# Patient Record
Sex: Female | Born: 1945 | Hispanic: No | Marital: Married | Smoking: Never smoker
Health system: Southern US, Community
[De-identification: ages and names within clinical notes are randomized; demographics above are authoritative.]

## PROBLEM LIST (undated history)

## (undated) DIAGNOSIS — E559 Vitamin D deficiency, unspecified: Secondary | ICD-10-CM

## (undated) DIAGNOSIS — Z1211 Encounter for screening for malignant neoplasm of colon: Secondary | ICD-10-CM

## (undated) DIAGNOSIS — M899 Disorder of bone, unspecified: Secondary | ICD-10-CM

## (undated) DIAGNOSIS — E876 Hypokalemia: Secondary | ICD-10-CM

## (undated) DIAGNOSIS — E119 Type 2 diabetes mellitus without complications: Secondary | ICD-10-CM

## (undated) DIAGNOSIS — R42 Dizziness and giddiness: Secondary | ICD-10-CM

## (undated) DIAGNOSIS — M949 Disorder of cartilage, unspecified: Secondary | ICD-10-CM

## (undated) DIAGNOSIS — R5381 Other malaise: Secondary | ICD-10-CM

## (undated) DIAGNOSIS — E785 Hyperlipidemia, unspecified: Secondary | ICD-10-CM

## (undated) DIAGNOSIS — I10 Essential (primary) hypertension: Secondary | ICD-10-CM

## (undated) DIAGNOSIS — D7289 Other specified disorders of white blood cells: Secondary | ICD-10-CM

## (undated) DIAGNOSIS — R5383 Other fatigue: Secondary | ICD-10-CM

## (undated) HISTORY — DX: Hypokalemia: E87.6

## (undated) HISTORY — DX: Essential (primary) hypertension: I10

## (undated) HISTORY — DX: Vitamin D deficiency, unspecified: E55.9

## (undated) HISTORY — DX: Disorder of bone, unspecified: M94.9

## (undated) HISTORY — DX: Other malaise: R53.81

## (undated) HISTORY — DX: Other specified disorders of white blood cells: D72.89

## (undated) HISTORY — DX: Hypercalcemia: E83.52

## (undated) HISTORY — DX: Hyperlipidemia, unspecified: E78.5

## (undated) HISTORY — DX: Type 2 diabetes mellitus without complications: E11.9

## (undated) HISTORY — DX: Dizziness and giddiness: R42

## (undated) HISTORY — DX: Disorder of bone, unspecified: M89.9

## (undated) HISTORY — DX: Other fatigue: R53.83

## (undated) HISTORY — DX: Encounter for screening for malignant neoplasm of colon: Z12.11

---

## 1994-08-04 HISTORY — PX: TONSILLECTOMY: SHX5217

## 1999-01-11 ENCOUNTER — Ambulatory Visit (HOSPITAL_COMMUNITY): Admission: RE | Admit: 1999-01-11 | Discharge: 1999-01-11 | Payer: Self-pay | Admitting: Family Medicine

## 1999-01-11 ENCOUNTER — Encounter: Payer: Self-pay | Admitting: Family Medicine

## 1999-08-05 HISTORY — PX: FRACTURE SURGERY: SHX138

## 2001-05-29 ENCOUNTER — Encounter: Payer: Self-pay | Admitting: Emergency Medicine

## 2001-05-29 ENCOUNTER — Encounter: Payer: Self-pay | Admitting: Orthopedic Surgery

## 2001-05-29 ENCOUNTER — Inpatient Hospital Stay (HOSPITAL_COMMUNITY): Admission: EM | Admit: 2001-05-29 | Discharge: 2001-05-31 | Payer: Self-pay | Admitting: Emergency Medicine

## 2005-12-02 ENCOUNTER — Observation Stay (HOSPITAL_COMMUNITY): Admission: EM | Admit: 2005-12-02 | Discharge: 2005-12-02 | Payer: Self-pay | Admitting: Emergency Medicine

## 2005-12-02 ENCOUNTER — Ambulatory Visit: Payer: Self-pay | Admitting: Cardiology

## 2005-12-10 ENCOUNTER — Ambulatory Visit: Payer: Self-pay

## 2006-01-27 ENCOUNTER — Ambulatory Visit: Payer: Self-pay | Admitting: Internal Medicine

## 2006-02-11 ENCOUNTER — Ambulatory Visit: Payer: Self-pay | Admitting: Internal Medicine

## 2006-02-19 ENCOUNTER — Ambulatory Visit: Payer: Self-pay | Admitting: Internal Medicine

## 2012-03-23 ENCOUNTER — Other Ambulatory Visit: Payer: Self-pay | Admitting: Internal Medicine

## 2012-03-23 DIAGNOSIS — Z1231 Encounter for screening mammogram for malignant neoplasm of breast: Secondary | ICD-10-CM

## 2012-03-23 DIAGNOSIS — Z780 Asymptomatic menopausal state: Secondary | ICD-10-CM

## 2012-04-09 ENCOUNTER — Ambulatory Visit
Admission: RE | Admit: 2012-04-09 | Discharge: 2012-04-09 | Disposition: A | Discharge status: Home / Self Care | Payer: BC Managed Care – PPO | Source: Ambulatory Visit | Attending: Internal Medicine | Admitting: Internal Medicine

## 2012-04-09 DIAGNOSIS — Z1231 Encounter for screening mammogram for malignant neoplasm of breast: Secondary | ICD-10-CM

## 2012-04-09 DIAGNOSIS — Z780 Asymptomatic menopausal state: Secondary | ICD-10-CM

## 2012-11-09 ENCOUNTER — Ambulatory Visit: Payer: Self-pay | Admitting: Internal Medicine

## 2012-12-07 ENCOUNTER — Other Ambulatory Visit: Payer: Self-pay | Admitting: Internal Medicine

## 2013-01-03 ENCOUNTER — Encounter: Payer: Self-pay | Admitting: *Deleted

## 2013-01-03 DIAGNOSIS — E785 Hyperlipidemia, unspecified: Secondary | ICD-10-CM

## 2013-01-03 DIAGNOSIS — R42 Dizziness and giddiness: Secondary | ICD-10-CM

## 2013-01-03 DIAGNOSIS — D7289 Other specified disorders of white blood cells: Secondary | ICD-10-CM

## 2013-01-03 DIAGNOSIS — R5381 Other malaise: Secondary | ICD-10-CM

## 2013-01-03 DIAGNOSIS — I10 Essential (primary) hypertension: Secondary | ICD-10-CM

## 2013-01-03 DIAGNOSIS — E550 Rickets, active: Secondary | ICD-10-CM

## 2013-01-03 DIAGNOSIS — E1149 Type 2 diabetes mellitus with other diabetic neurological complication: Secondary | ICD-10-CM

## 2013-01-04 ENCOUNTER — Ambulatory Visit (INDEPENDENT_AMBULATORY_CARE_PROVIDER_SITE_OTHER): Payer: BC Managed Care – PPO | Admitting: Internal Medicine

## 2013-01-04 ENCOUNTER — Encounter: Payer: Self-pay | Admitting: Internal Medicine

## 2013-01-04 VITALS — BP 150/100 | HR 71 | Temp 97.8°F | Resp 18 | Ht 62.0 in | Wt 136.0 lb

## 2013-01-04 DIAGNOSIS — E559 Vitamin D deficiency, unspecified: Secondary | ICD-10-CM

## 2013-01-04 DIAGNOSIS — I10 Essential (primary) hypertension: Secondary | ICD-10-CM

## 2013-01-04 DIAGNOSIS — E119 Type 2 diabetes mellitus without complications: Secondary | ICD-10-CM

## 2013-01-04 DIAGNOSIS — R5381 Other malaise: Secondary | ICD-10-CM

## 2013-01-04 MED ORDER — VITAMIN D3 1.25 MG (50000 UT) PO CAPS: 1.0000 | ORAL_CAPSULE | ORAL | Status: DC

## 2013-01-04 MED ORDER — GLUCOSE BLOOD VI STRP: ORAL_STRIP | Status: DC

## 2013-01-04 MED ORDER — BENAZEPRIL-HYDROCHLOROTHIAZIDE 20-12.5 MG PO TABS: 1.0000 | ORAL_TABLET | Freq: Every day | ORAL | Status: DC

## 2013-01-04 MED ORDER — ONETOUCH ULTRASOFT LANCETS MISC: Status: DC

## 2013-01-04 NOTE — Progress Notes (Signed)
  Subjective:    Patient ID: Joann Kennedy, female    DOB: 1946-04-10, 67 y.o.   MRN: [id number]  CC- routine visit  HPI Pt missed her last appointment and is here for follow up visit. She has been taking her metformin. Has not checked her sugar recently as she has ran out of her test strips. She has been taking her bp medications and mentions bp reading being 130-145 sbp and dbp 60-88 mostly. Has not taken her vit d prescribed She feels very tired and lacks energy by end of the day. Does yard work for exercise. Tries to be careful with her meals.  No falls reported  Review of Systems  Constitutional: Positive for fatigue. Negative for fever and appetite change.  HENT: Negative for mouth sores.   Respiratory: Negative for cough and shortness of breath.   Cardiovascular: Negative for chest pain, palpitations and leg swelling.  Gastrointestinal: Negative for abdominal pain, diarrhea and constipation.  Endocrine: Negative for polydipsia and polyuria.  Genitourinary: Negative for dysuria.  Musculoskeletal: Negative for back pain and arthralgias.  Skin: Negative for pallor and rash.  Neurological: Negative for dizziness, seizures, syncope and light-headedness.  Psychiatric/Behavioral: Negative for sleep disturbance and agitation.   Past Medical History  Diagnosis Date  . Type II or unspecified type diabetes mellitus without mention of complication, not stated as uncontrolled   . Hyperlipidemia   . Hypertension    No Known Allergies     Objective:   Physical Exam  BP 150/100  Pulse 71  Temp(Src) 97.8 F (36.6 C) (Oral)  Resp 18  Ht 5\' 2"  (1.575 m)  Wt 136 lb (61.689 kg)  BMI 24.87 kg/m2  SpO2 99%  gen- adult female in NAD HEENT- no pallor, no icterus, no LAD, mmm cvs-normal s1,s2, rrr respi- b/l cta, no wheeze or rhonchi abdo- bowel sound present, soft, non tender Ext- no edema, able to move all 4, gait steady, no ulcer/ sores in feet Neuro- aaox3, no focal  deficit Psych- mood appropriate  Labs reviewed-   06/22/12 CBC; WBC 13.3, RBC 4.17, HGB 12.0 CMP; Glucose 96, BUN 15, Creatinine 0.76 A1c 7.9, TSH 2.190, Vit D 23.5  09/17/12- glu 120, bun 18, cr 0.69, na 141, k 4.1, ca 9.2, wbc 11.3, hb 11.9, hct 35.3, plt 426, a1c 7.3, microalbumin 20/3, pth 22    Assessment & Plan:   Diabetes mellitus- recheck a1c. Provided a glucometer and script for test strips. Continue current dose of metformin. uptodate with eye and foot exam. Continue statin and benazepril. Consider adding asa next visit  htn- bp elevated this visit. Will increase her benazepril to 20 mg and continue hctz 12.5 mg daily. monitor bp at home. To bring home bp machine for corelation to the office, warning signs with elevated bp explained  Vit d def- new script for vit d provided  Hyperlipidemia- continue current dose of pravachol  Fatigue- vit d def itself could be contributing some. Check a1c to assess for worsening dm. Also rule out anemia and hypothyroidism - check tsh and cbc

## 2013-01-05 DIAGNOSIS — R5381 Other malaise: Secondary | ICD-10-CM | POA: Insufficient documentation

## 2013-01-05 DIAGNOSIS — R5383 Other fatigue: Secondary | ICD-10-CM | POA: Insufficient documentation

## 2013-01-05 DIAGNOSIS — E559 Vitamin D deficiency, unspecified: Secondary | ICD-10-CM | POA: Insufficient documentation

## 2013-01-05 LAB — COMPREHENSIVE METABOLIC PANEL
ALT: 36 IU/L — ABNORMAL HIGH (ref 0–32)
AST: 22 IU/L (ref 0–40)
Albumin/Globulin Ratio: 1.8 (ref 1.1–2.5)
Calcium: 10 mg/dL (ref 8.6–10.2)
Chloride: 99 mmol/L (ref 97–108)
GFR calc non Af Amer: 70 mL/min/{1.73_m2} (ref >59)
Potassium: 4.1 mmol/L (ref 3.5–5.2)
Sodium: 139 mmol/L (ref 134–144)
Total Bilirubin: 0.2 mg/dL (ref 0.0–1.2)

## 2013-01-05 LAB — CBC WITH DIFFERENTIAL/PLATELET
Basophils Absolute: 0 10*3/uL (ref 0.0–0.2)
Immature Grans (Abs): 0 10*3/uL (ref 0.0–0.1)
Immature Granulocytes: 0 % (ref 0–2)
Lymphs: 54 % — ABNORMAL HIGH (ref 14–46)
MCHC: 33.7 g/dL (ref 31.5–35.7)
Monocytes: 7 % (ref 4–12)
RDW: 13.8 % (ref 12.3–15.4)
WBC: 10.5 10*3/uL (ref 3.4–10.8)

## 2013-01-05 LAB — HEMOGLOBIN A1C
Est. average glucose Bld gHb Est-mCnc: 177 mg/dL
Hgb A1c MFr Bld: 7.8 % — ABNORMAL HIGH (ref 4.8–5.6)

## 2013-01-24 ENCOUNTER — Encounter: Payer: Self-pay | Admitting: *Deleted

## 2013-01-26 ENCOUNTER — Encounter: Payer: Self-pay | Admitting: Internal Medicine

## 2013-01-26 ENCOUNTER — Ambulatory Visit (INDEPENDENT_AMBULATORY_CARE_PROVIDER_SITE_OTHER): Payer: BC Managed Care – PPO | Admitting: Internal Medicine

## 2013-01-26 VITALS — BP 140/98 | HR 75 | Temp 97.7°F | Resp 16 | Ht 62.0 in | Wt 135.0 lb

## 2013-01-26 DIAGNOSIS — I10 Essential (primary) hypertension: Secondary | ICD-10-CM

## 2013-01-26 DIAGNOSIS — K589 Irritable bowel syndrome without diarrhea: Secondary | ICD-10-CM

## 2013-01-26 DIAGNOSIS — R5383 Other fatigue: Secondary | ICD-10-CM

## 2013-01-26 DIAGNOSIS — E119 Type 2 diabetes mellitus without complications: Secondary | ICD-10-CM

## 2013-01-26 DIAGNOSIS — E785 Hyperlipidemia, unspecified: Secondary | ICD-10-CM

## 2013-01-26 DIAGNOSIS — R5381 Other malaise: Secondary | ICD-10-CM

## 2013-01-26 MED ORDER — METFORMIN HCL 1000 MG PO TABS: 1000.0000 mg | ORAL_TABLET | Freq: Two times a day (BID) | ORAL | Status: DC

## 2013-01-26 MED ORDER — LINACLOTIDE 290 MCG PO CAPS: 1.0000 | ORAL_CAPSULE | Freq: Every day | ORAL | Status: DC

## 2013-01-26 NOTE — Progress Notes (Signed)
Patient ID: Joann Kennedy, female   DOB: 1945-12-14, 67 y.o.   MRN: [id number]  Chief Complaint  Patient presents with  . Medical Managment of Chronic Issues    no new problems    HPI Patient is here with her husband to review labs. Her labs have been reviewed. She is working in yard for her exercise and her cbg is 150-220 mostly. No hypoglycemia. Compliant with her meds. non compliant with diet. Stressed at work and home. Has been having loose sool intermittently with cramps in her abdomen and urge for defecation and pain/ discomfort improves with defecation. No blood or mucus in stool. Colonoscopy and EGD 5 years back normal. Does not tolerate lactose well and avoids it  Review of Systems  Constitutional: Positive for fatigue. Negative for fever and appetite change.  HENT: Negative for mouth sores.   Respiratory: Negative for cough and shortness of breath.   Cardiovascular: Negative for chest pain, palpitations and leg swelling.  Gastrointestinal: Negative for abdominal pain, diarrhea and constipation.  Endocrine: Negative for polydipsia and polyuria.  Genitourinary: Negative for dysuria.  Musculoskeletal: Negative for back pain and arthralgias.  Skin: Negative for pallor and rash.  Neurological: Negative for dizziness, seizures, syncope and light-headedness.  Psychiatric/Behavioral: Negative for sleep disturbance and agitation.   No Known Allergies  Past Medical History  Diagnosis Date  . Type II or unspecified type diabetes mellitus without mention of complication, not stated as uncontrolled   . Hyperlipidemia   . Hypertension   . Dizziness and giddiness   . Unspecified vitamin D deficiency   . Hypercalcemia   . Hypopotassemia   . Other specified disease of white blood cells   . Other malaise and fatigue   . Disorder of bone and cartilage, unspecified   . Special screening for malignant neoplasms, colon    BP 140/98  Pulse 75  Temp(Src) 97.7 F (36.5 C) (Oral)  Resp 16   Ht 5\' 2"  (1.575 m)  Wt 135 lb (61.236 kg)  BMI 24.69 kg/m2  SpO2 98%  gen- adult female in NAD HEENT- no pallor, no icterus, no LAD, mmm cvs-normal s1,s2, rrr respi- b/l cta, no wheeze or rhonchi abdo- bowel sound present, soft, non tender Ext- no edema, able to move all 4, gait steady, no ulcer/ sores in feet Neuro- aaox3, no focal deficit Psych- mood appropriate  Labs reviewed-   06/22/12 CBC; WBC 13.3, RBC 4.17, HGB 12.0 CMP; Glucose 96, BUN 15, Creatinine 0.76 A1c 7.9, TSH 2.190, Vit D 23.5  09/17/12- glu 120, bun 18, cr 0.69, na 141, k 4.1, ca 9.2, wbc 11.3, hb 11.9, hct 35.3, plt 426, a1c 7.3, microalbumin 20/3, pth 22  CBC    Component Value Date/Time   WBC 10.5 01/04/2013 1703   RBC 3.92 01/04/2013 1703   HGB 11.5 01/04/2013 1703   HCT 34.1 01/04/2013 1703   MCV 87 01/04/2013 1703   MCH 29.3 01/04/2013 1703   MCHC 33.7 01/04/2013 1703   RDW 13.8 01/04/2013 1703   LYMPHSABS 5.7* 01/04/2013 1703   EOSABS 0.3 01/04/2013 1703   BASOSABS 0.0 01/04/2013 1703    CMP     Component Value Date/Time   NA 139 01/04/2013 1703   K 4.1 01/04/2013 1703   CL 99 01/04/2013 1703   CO2 24 01/04/2013 1703   GLUCOSE 100* 01/04/2013 1703   BUN 16 01/04/2013 1703   CREATININE 0.87 01/04/2013 1703   CALCIUM 10.0 01/04/2013 1703   PROT 7.0 01/04/2013 1703  AST 22 01/04/2013 1703   ALT 36* 01/04/2013 1703   ALKPHOS 76 01/04/2013 1703   BILITOT 0.2 01/04/2013 1703   GFRNONAA 70 01/04/2013 1703   GFRAA 81 01/04/2013 1703   a1c 7.8  tsh 1.170  ASSESSMENT/PLAN-  Diabetes mellitus- worsened. Diet compliance, meal proportion, dietary counselling with calorie counting provided. To increase metformin to 1000 mg bid for now and monitor cbg. uptodate with eye and foot exam. Continue statin and benazepril.   htn- bp elevated this visit on arrival with repeat check normal. Reviewed home bp, well controlled. Continue current regimen  Vit d def- continue vit d supplement.  Hyperlipidemia- continue current dose of pravachol and  check flp and CK. Possible myositis contributing to her fatigue as well  IBS- will give trial of linzess and reassess 290 mcg cap once a day, reassess in a month. Side effect explained

## 2013-02-01 ENCOUNTER — Other Ambulatory Visit: Payer: BC Managed Care – PPO

## 2013-02-01 DIAGNOSIS — E785 Hyperlipidemia, unspecified: Secondary | ICD-10-CM

## 2013-02-02 LAB — LIPID PANEL
Chol/HDL Ratio: 3.5 ratio units (ref 0.0–4.4)
LDL Calculated: 86 mg/dL (ref 0–99)

## 2013-02-03 ENCOUNTER — Other Ambulatory Visit: Payer: Self-pay | Admitting: Geriatric Medicine

## 2013-02-03 MED ORDER — SIMVASTATIN 10 MG PO TABS: 10.0000 mg | ORAL_TABLET | Freq: Every day | ORAL | Status: DC

## 2013-02-09 ENCOUNTER — Other Ambulatory Visit: Payer: Self-pay | Admitting: Geriatric Medicine

## 2013-03-01 ENCOUNTER — Ambulatory Visit: Payer: Self-pay | Admitting: Internal Medicine

## 2013-04-08 ENCOUNTER — Other Ambulatory Visit: Payer: Self-pay | Admitting: Internal Medicine

## 2013-04-08 NOTE — Telephone Encounter (Signed)
Left message on voicemail for patient to return call when available. Reason for call: discuss refill request, request is for 10-12.5 yet medication list states 20-12.5, need to clarify with patient.

## 2013-04-08 NOTE — Telephone Encounter (Signed)
Spoke with patient's husband, patient's current medication bottle is for 10-12.5. Per patient's OV note from 01/04/2013, patient was to increase medication to 20-12.5 due to elevation of blood pressure. I called the pharmacy to verify rx on file for 20-12.5 and was told yes, rx to by filled for patient, pharmacist verbalized understanding.

## 2013-05-10 ENCOUNTER — Ambulatory Visit: Payer: Self-pay | Admitting: Internal Medicine

## 2013-05-18 ENCOUNTER — Ambulatory Visit: Payer: Self-pay | Admitting: Internal Medicine

## 2013-07-07 ENCOUNTER — Other Ambulatory Visit: Payer: Self-pay | Admitting: Internal Medicine

## 2013-07-08 ENCOUNTER — Other Ambulatory Visit: Payer: Self-pay | Admitting: Internal Medicine

## 2013-08-23 ENCOUNTER — Encounter: Payer: Self-pay | Admitting: Internal Medicine

## 2013-08-23 ENCOUNTER — Ambulatory Visit (INDEPENDENT_AMBULATORY_CARE_PROVIDER_SITE_OTHER): Payer: BC Managed Care – PPO | Admitting: Internal Medicine

## 2013-08-23 VITALS — BP 124/88 | HR 70 | Temp 98.0°F | Resp 16 | Wt 132.0 lb

## 2013-08-23 DIAGNOSIS — E785 Hyperlipidemia, unspecified: Secondary | ICD-10-CM

## 2013-08-23 DIAGNOSIS — I10 Essential (primary) hypertension: Secondary | ICD-10-CM

## 2013-08-23 DIAGNOSIS — E559 Vitamin D deficiency, unspecified: Secondary | ICD-10-CM

## 2013-08-23 DIAGNOSIS — E119 Type 2 diabetes mellitus without complications: Secondary | ICD-10-CM

## 2013-08-23 MED ORDER — GLUCOSE BLOOD VI STRP: ORAL_STRIP | Status: DC

## 2013-08-23 MED ORDER — CALCIUM-VITAMIN D 250-125 MG-UNIT PO TABS: 1.0000 | ORAL_TABLET | Freq: Two times a day (BID) | ORAL | Status: DC

## 2013-08-23 MED ORDER — FREESTYLE LANCETS MISC: Status: DC

## 2013-08-23 NOTE — Progress Notes (Signed)
Patient ID: Joann Kennedy, female   DOB: 08-13-1945, 68 y.o.   MRN: [id number]    Chief Complaint  Patient presents with  . Follow-up    diabetes, HTN, med refills   HPI 68 y/o female pt here for routine follow up. She denies any complaints. She is going to be a grand mom soon and is excited. She is complaint with her meds. Is working on treadmill for 10-15 minutes everyday.  cbg this am was not checked Average cbg 114 with 70-130 bp has been stable at home  Review of Systems  Constitutional: Negative for fever, chills, weight loss, malaise/fatigue and diaphoresis.  HENT: Negative for congestion, hearing loss and sore throat.   Eyes: Negative for blurred vision, double vision and discharge.  Respiratory: Negative for cough, sputum production, shortness of breath and wheezing.   Cardiovascular: Negative for chest pain, palpitations, orthopnea and leg swelling.  Gastrointestinal: Negative for heartburn, nausea, vomiting, abdominal pain, diarrhea and constipation.  Genitourinary: Negative for dysuria, urgency, frequency and flank pain.  Musculoskeletal: Negative for back pain, falls, joint pain and myalgias.  Skin: Negative for itching and rash.  Neurological: Negative for dizziness, tingling, focal weakness and headaches.  Psychiatric/Behavioral: Negative for depression and memory loss. The patient is not nervous/anxious.    Past Medical History  Diagnosis Date  . Type II or unspecified type diabetes mellitus without mention of complication, not stated as uncontrolled   . Hyperlipidemia   . Hypertension   . Dizziness and giddiness   . Unspecified vitamin D deficiency   . Hypercalcemia   . Hypopotassemia   . Other specified disease of white blood cells   . Other malaise and fatigue   . Disorder of bone and cartilage, unspecified   . Special screening for malignant neoplasms, colon    Current Outpatient Prescriptions on File Prior to Visit  Medication Sig Dispense Refill  .  benazepril-hydrochlorthiazide (LOTENSIN HCT) 20-12.5 MG per tablet Take 1 tablet by mouth daily.  30 tablet  6  . glucose blood test strip Use as instructed. Test once daily to control blood sugar. 250.00  100 each  12  . Lancets (ONETOUCH ULTRASOFT) lancets Use as instructed  100 each  12  . metFORMIN (GLUCOPHAGE) 1000 MG tablet Take 1 tablet (1,000 mg total) by mouth 2 (two) times daily with a meal. Take 2 tablets every morning.  Take 1 tablet every evening for blood sugar.  60 tablet  3  . pravastatin (PRAVACHOL) 40 MG tablet Take 40 mg by mouth daily. Take 1 tablet once a day to control cholesterol.       No current facility-administered medications on file prior to visit.   Physical exam BP 124/88  Pulse 70  Temp(Src) 98 F (36.7 C) (Oral)  Resp 16  Wt 132 lb (59.875 kg)  SpO2 98%  gen- adult female in NAD HEENT- no pallor, no icterus, no LAD, mmm cvs-normal s1,s2, rrr respi- b/l cta, no wheeze or rhonchi abdo- bowel sound present, soft, non tender Ext- no edema, able to move all 4, gait steady, no ulcer/ sores in feet Neuro- aaox3, no focal deficit, normal foot exam Psych- mood appropriate  Labs reviewed-   06/22/12 CBC; WBC 13.3, RBC 4.17, HGB 12.0 CMP; Glucose 96, BUN 15, Creatinine 0.76 A1c 7.9, TSH 2.190, Vit D 23.5  09/17/12- glu 120, bun 18, cr 0.69, na 141, k 4.1, ca 9.2, wbc 11.3, hb 11.9, hct 35.3, plt 426, a1c 7.3, microalbumin 20/3, pth 22  Labs- CBC    Component Value Date/Time   WBC 10.5 01/04/2013 1703   RBC 3.92 01/04/2013 1703   HGB 11.5 01/04/2013 1703   HCT 34.1 01/04/2013 1703   MCV 87 01/04/2013 1703   MCH 29.3 01/04/2013 1703   MCHC 33.7 01/04/2013 1703   RDW 13.8 01/04/2013 1703   LYMPHSABS 5.7* 01/04/2013 1703   EOSABS 0.3 01/04/2013 1703   BASOSABS 0.0 01/04/2013 1703    CMP     Component Value Date/Time   NA 139 01/04/2013 1703   K 4.1 01/04/2013 1703   CL 99 01/04/2013 1703   CO2 24 01/04/2013 1703   GLUCOSE 100* 01/04/2013 1703   BUN 16 01/04/2013 1703    CREATININE 0.87 01/04/2013 1703   CALCIUM 10.0 01/04/2013 1703   PROT 7.0 01/04/2013 1703   AST 22 01/04/2013 1703   ALT 36* 01/04/2013 1703   ALKPHOS 76 01/04/2013 1703   BILITOT 0.2 01/04/2013 1703   GFRNONAA 70 01/04/2013 1703   GFRAA 81 01/04/2013 1703   Lab Results  Component Value Date   HGBA1C 7.8* 01/04/2013   Lipid Panel     Component Value Date/Time   TRIG 221* 02/01/2013 0929   HDL 51 02/01/2013 0929   CHOLHDL 3.5 02/01/2013 0929   LDLCALC 86 02/01/2013 0929    Assessment/plan Diabetes mellitus- recheck a1c.uptodate with eye and foot exam. Continue statin and benazepril. Continue metformin. Monitor cbg  Hypertension- bp well controlled. Continue benazepril- hctz 20-12.5 mg daily. monitor bp at home. Check bmp  Vit d def- check vit d level. Will start her on oscal bid for now. Samples provided. Energy level has improved  Hyperlipidemia- continue current dose of pravachol. Check lipid panel   Labs- lipid panel, a1c, bmp, vitamin d

## 2013-08-24 ENCOUNTER — Other Ambulatory Visit: Payer: Self-pay | Admitting: Internal Medicine

## 2013-08-25 LAB — HEMOGLOBIN A1C
ESTIMATED AVERAGE GLUCOSE: 160 mg/dL
HEMOGLOBIN A1C: 7.2 % — AB (ref 4.8–5.6)

## 2013-08-25 LAB — BASIC METABOLIC PANEL
BUN / CREAT RATIO: 24 (ref 11–26)
BUN: 18 mg/dL (ref 8–27)
CALCIUM: 9.8 mg/dL (ref 8.7–10.3)
CHLORIDE: 100 mmol/L (ref 97–108)
CO2: 23 mmol/L (ref 18–29)
CREATININE: 0.74 mg/dL (ref 0.57–1.00)
GFR calc Af Amer: 98 mL/min/{1.73_m2} (ref >59)
GFR calc non Af Amer: 85 mL/min/{1.73_m2} (ref >59)
Glucose: 82 mg/dL (ref 65–99)
Potassium: 3.8 mmol/L (ref 3.5–5.2)
SODIUM: 141 mmol/L (ref 134–144)

## 2013-08-25 LAB — CBC WITH DIFFERENTIAL/PLATELET
BASOS ABS: 0 10*3/uL (ref 0.0–0.2)
BASOS: 0 %
EOS ABS: 0.3 10*3/uL (ref 0.0–0.4)
Eos: 3 %
HCT: 35 % (ref 34.0–46.6)
HEMOGLOBIN: 11.9 g/dL (ref 11.1–15.9)
IMMATURE GRANS (ABS): 0 10*3/uL (ref 0.0–0.1)
Immature Granulocytes: 0 %
LYMPHS: 54 %
Lymphocytes Absolute: 6.7 10*3/uL — ABNORMAL HIGH (ref 0.7–3.1)
MCH: 29.6 pg (ref 26.6–33.0)
MCHC: 34 g/dL (ref 31.5–35.7)
MCV: 87 fL (ref 79–97)
MONOCYTES: 5 %
Monocytes Absolute: 0.6 10*3/uL (ref 0.1–0.9)
NEUTROS ABS: 4.6 10*3/uL (ref 1.4–7.0)
NEUTROS PCT: 38 %
RBC: 4.02 x10E6/uL (ref 3.77–5.28)
RDW: 13.7 % (ref 12.3–15.4)
WBC: 12.3 10*3/uL — ABNORMAL HIGH (ref 3.4–10.8)

## 2013-08-25 LAB — VITAMIN D 1,25 DIHYDROXY: Vit D, 1,25-Dihydroxy: 72.2 pg/mL (ref 10.0–75.0)

## 2013-08-25 LAB — LIPID PANEL
CHOL/HDL RATIO: 3.9 ratio (ref 0.0–4.4)
Cholesterol, Total: 222 mg/dL — ABNORMAL HIGH (ref 100–199)
HDL: 57 mg/dL (ref >39)
LDL CALC: 106 mg/dL — AB (ref 0–99)
Triglycerides: 295 mg/dL — ABNORMAL HIGH (ref 0–149)
VLDL CHOLESTEROL CAL: 59 mg/dL — AB (ref 5–40)

## 2013-08-25 NOTE — Telephone Encounter (Signed)
Left message to rtn call regarding lab results and the change to 80 mg cholesterol medication.

## 2013-08-30 ENCOUNTER — Other Ambulatory Visit: Payer: Self-pay

## 2013-08-30 MED ORDER — PRAVASTATIN SODIUM 80 MG PO TABS: 80.0000 mg | ORAL_TABLET | Freq: Every day | ORAL | Status: DC

## 2013-09-06 ENCOUNTER — Other Ambulatory Visit: Payer: Self-pay | Admitting: Internal Medicine

## 2013-10-18 ENCOUNTER — Encounter: Payer: Self-pay | Admitting: Internal Medicine

## 2013-10-18 ENCOUNTER — Ambulatory Visit (INDEPENDENT_AMBULATORY_CARE_PROVIDER_SITE_OTHER): Payer: BC Managed Care – PPO | Admitting: Internal Medicine

## 2013-10-18 VITALS — BP 132/80 | HR 77 | Temp 98.2°F | Resp 18 | Ht 62.0 in | Wt 135.0 lb

## 2013-10-18 DIAGNOSIS — E785 Hyperlipidemia, unspecified: Secondary | ICD-10-CM

## 2013-10-18 DIAGNOSIS — I10 Essential (primary) hypertension: Secondary | ICD-10-CM

## 2013-10-18 DIAGNOSIS — E119 Type 2 diabetes mellitus without complications: Secondary | ICD-10-CM

## 2013-10-18 MED ORDER — BENAZEPRIL-HYDROCHLOROTHIAZIDE 20-12.5 MG PO TABS: 1.0000 | ORAL_TABLET | Freq: Every day | ORAL | Status: DC

## 2013-10-18 MED ORDER — METFORMIN HCL 1000 MG PO TABS: 1000.0000 mg | ORAL_TABLET | Freq: Two times a day (BID) | ORAL | Status: DC

## 2013-10-18 MED ORDER — PRAVASTATIN SODIUM 80 MG PO TABS: 80.0000 mg | ORAL_TABLET | Freq: Every day | ORAL | Status: DC

## 2013-10-18 NOTE — Progress Notes (Signed)
Patient ID: Tauheedah Bok, female   DOB: May 16, 1946, 68 y.o.   MRN: [id number]     Chief Complaint  Patient presents with  . Follow-up   No Known Allergies  HPI 68 y/o female pt is here for follow up with concerns. She is moving to [place] after retiring to be with her daughter. She would like to get 90 days supply on her medications from here on. She denies any complaints. Has been compliant with her medications. cbg controlled  Review of Systems  Constitutional: Negative for fever, chills, weight loss, malaise/fatigue and diaphoresis.  HENT: Negative for congestion, hearing loss and sore throat.   Eyes: Negative for blurred vision, double vision and discharge.  Respiratory: Negative for cough, sputum production, shortness of breath and wheezing.   Cardiovascular: Negative for chest pain, palpitations, orthopnea and leg swelling.  Gastrointestinal: Negative for heartburn, nausea, vomiting, abdominal pain, diarrhea and constipation.  Genitourinary: Negative for dysuria, urgency, frequency and flank pain.  Musculoskeletal: Negative for back pain, falls, joint pain and myalgias.  Skin: Negative for itching and rash.  Neurological: Negative for dizziness, tingling, focal weakness and headaches.  Psychiatric/Behavioral: Negative for depression and memory loss. The patient is not nervous/anxious.    Past Medical History  Diagnosis Date  . Type II or unspecified type diabetes mellitus without mention of complication, not stated as uncontrolled   . Hyperlipidemia   . Hypertension   . Dizziness and giddiness   . Unspecified vitamin D deficiency   . Hypercalcemia   . Hypopotassemia   . Other specified disease of white blood cells   . Other malaise and fatigue   . Disorder of bone and cartilage, unspecified   . Special screening for malignant neoplasms, colon    Past Surgical History  Procedure Laterality Date  . Tonsillectomy  1996  . Fracture surgery  2001    Tibia &  fibula   Current Outpatient Prescriptions on File Prior to Visit  Medication Sig Dispense Refill  . calcium-vitamin D (OSCAL) 250-125 MG-UNIT per tablet Take 1 tablet by mouth 2 (two) times daily.  60 tablet  0  . glucose blood (FREESTYLE LITE) test strip Use as instructed  100 each  12  . glucose blood test strip Use as instructed. Test once daily to control blood sugar. 250.00  100 each  12  . Lancets (FREESTYLE) lancets Use as instructed  100 each  12  . Lancets (ONETOUCH ULTRASOFT) lancets Use as instructed  100 each  12   No current facility-administered medications on file prior to visit.    Physical exam BP 132/80  Pulse 77  Temp(Src) 98.2 F (36.8 C) (Oral)  Resp 18  Ht 5\' 2"  (1.575 m)  Wt 135 lb (61.236 kg)  BMI 24.69 kg/m2  SpO2 97%  gen- adult female in NAD HEENT- no pallor, no icterus, no LAD, mmm cvs-normal s1,s2, rrr respi- b/l cta, no wheeze or rhonchi abdo- bowel sound present, soft, non tender Ext- no edema, able to move all 4, gait steady, no ulcer/ sores in feet Neuro- aaox3, no focal deficit, normal foot exam Psych- mood appropriate  Labs reviewed-   06/22/12 CBC; WBC 13.3, RBC 4.17, HGB 12.0 CMP; Glucose 96, BUN 15, Creatinine 0.76 A1c 7.9, TSH 2.190, Vit D 23.5  09/17/12- glu 120, bun 18, cr 0.69, na 141, k 4.1, ca 9.2, wbc 11.3, hb 11.9, hct 35.3, plt 426, a1c 7.3, microalbumin 20/3, pth 22   Lab Results  Component Value  Date   HGBA1C 7.2* 08/23/2013    Assessment/plan  1. HTN (hypertension) Stable. Continue  benazepril- hctz 20-12.5 mg daily. monitor bp at home.   2. Other and unspecified hyperlipidemia continue current dose of pravachol.  3.Diabetes mellitus uptodate with eye and foot exam. Continue statin and benazepril. Continue metformin. Monitor cbg

## 2014-02-21 ENCOUNTER — Ambulatory Visit: Payer: BC Managed Care – PPO | Admitting: Internal Medicine

## 2014-02-21 ENCOUNTER — Ambulatory Visit: Payer: Self-pay | Admitting: Internal Medicine

## 2014-05-17 ENCOUNTER — Encounter: Payer: Self-pay | Admitting: Internal Medicine

## 2014-05-17 ENCOUNTER — Ambulatory Visit (INDEPENDENT_AMBULATORY_CARE_PROVIDER_SITE_OTHER): Payer: Medicare Other | Admitting: Internal Medicine

## 2014-05-17 VITALS — BP 136/80 | HR 71 | Temp 98.0°F | Resp 18 | Ht 62.0 in | Wt 133.4 lb

## 2014-05-17 DIAGNOSIS — E785 Hyperlipidemia, unspecified: Secondary | ICD-10-CM | POA: Diagnosis not present

## 2014-05-17 DIAGNOSIS — E2839 Other primary ovarian failure: Secondary | ICD-10-CM | POA: Diagnosis not present

## 2014-05-17 DIAGNOSIS — Z1231 Encounter for screening mammogram for malignant neoplasm of breast: Secondary | ICD-10-CM | POA: Insufficient documentation

## 2014-05-17 DIAGNOSIS — Z1211 Encounter for screening for malignant neoplasm of colon: Secondary | ICD-10-CM

## 2014-05-17 DIAGNOSIS — I10 Essential (primary) hypertension: Secondary | ICD-10-CM | POA: Diagnosis not present

## 2014-05-17 DIAGNOSIS — Z1239 Encounter for other screening for malignant neoplasm of breast: Secondary | ICD-10-CM

## 2014-05-17 DIAGNOSIS — E559 Vitamin D deficiency, unspecified: Secondary | ICD-10-CM

## 2014-05-17 DIAGNOSIS — E119 Type 2 diabetes mellitus without complications: Secondary | ICD-10-CM

## 2014-05-17 NOTE — Progress Notes (Signed)
Patient ID: Joann Kennedy, female   DOB: 13-Mar-1946, 68 y.o.   MRN: [id number]    Chief Complaint  Patient presents with  . Medical Management of Chronic Issues   No Known Allergies  HPI 68 y/o female patient is here for routine follow up. She has hx of DM and HTN. She denies any complaints. Has been compliant with her medications. cbg controlled. She is due for annual exam but does not want it at present. She is due for mammogram and immunizations but would like to hold off on it for now. Here with her husband. No recent labs. She is occupied taking care of her grand child and feels other things can wait for now  Review of Systems   Constitutional: Negative for fever, chills, weight loss, malaise/fatigue and diaphoresis.   HENT: Negative for congestion, hearing loss and sore throat.    Eyes: Negative for blurred vision, double vision and discharge. Has not seen an eye doctor recently Respiratory: Negative for cough, sputum production, shortness of breath and wheezing.    Cardiovascular: Negative for chest pain, palpitations, orthopnea and leg swelling.   Gastrointestinal: Negative for heartburn, nausea, vomiting, abdominal pain, diarrhea and constipation.   Genitourinary: Negative for dysuria, urgency, frequency and flank pain.   Musculoskeletal: Negative for back pain, falls, joint pain and myalgias.   Skin: Negative for itching and rash.   Neurological: Negative for dizziness, tingling, focal weakness and headaches.   Psychiatric/Behavioral: Negative for depression and memory loss. The patient is not nervous/anxious.    Past Medical History  Diagnosis Date  . Type II or unspecified type diabetes mellitus without mention of complication, not stated as uncontrolled   . Hyperlipidemia   . Hypertension   . Dizziness and giddiness   . Unspecified vitamin D deficiency   . Hypercalcemia   . Hypopotassemia   . Other specified disease of white blood cells   . Other malaise and fatigue     . Disorder of bone and cartilage, unspecified   . Special screening for malignant neoplasms, colon    Current Outpatient Prescriptions on File Prior to Visit  Medication Sig Dispense Refill  . benazepril-hydrochlorthiazide (LOTENSIN HCT) 20-12.5 MG per tablet Take 1 tablet by mouth daily.  90 tablet  3  . calcium-vitamin D (OSCAL) 250-125 MG-UNIT per tablet Take 1 tablet by mouth 2 (two) times daily.  60 tablet  0  . glucose blood (FREESTYLE LITE) test strip Use as instructed  100 each  12  . glucose blood test strip Use as instructed. Test once daily to control blood sugar. 250.00  100 each  12  . Lancets (FREESTYLE) lancets Use as instructed  100 each  12  . Lancets (ONETOUCH ULTRASOFT) lancets Use as instructed  100 each  12  . metFORMIN (GLUCOPHAGE) 1000 MG tablet Take 1 tablet (1,000 mg total) by mouth 2 (two) times daily with a meal.  180 tablet  3  . pravastatin (PRAVACHOL) 80 MG tablet Take 1 tablet (80 mg total) by mouth daily.  90 tablet  3   No current facility-administered medications on file prior to visit.   History   Social History  . Marital Status: Married    Spouse Name: N/A    Number of Children: N/A  . Years of Education: N/A   Occupational History  . Not on file.   Social History Main Topics  . Smoking status: Never Smoker   . Smokeless tobacco: Not on file  . Alcohol Use: No  .  Drug Use: No  . Sexual Activity: Not on file   Other Topics Concern  . Not on file   Social History Narrative  . No narrative on file   Physical exam BP 136/80  Pulse 71  Temp(Src) 98 F (36.7 C) (Oral)  Resp 18  Ht 5\' 2"  (1.575 m)  Wt 133 lb 6.4 oz (60.51 kg)  BMI 24.39 kg/m2  SpO2 99%  General- elderly female in no acute distress Head- atraumatic, normocephalic Eyes- PERRLA, EOMI, no pallor, no icterus, no discharge Ears- left ear normal tympanic membrane and normal external ear canal , right ear normal tympanic membrane and normal external ear canal Neck- no  lymphadenopathy, no thyromegaly, no jugular vein distension, no carotid bruit Nose- normal nasal mucosa Mouth- normal mucus membrane, no oral thrush, normal oropharynx Cardiovascular- normal s1,s2, no murmurs/ rubs/ gallops, normal distal pulses Respiratory- bilateral clear to auscultation, no wheeze, no rhonchi, no crackles Abdomen- bowel sounds present, soft, non tender, no organomegaly, no guarding or rigidity, no CVA tenderness Musculoskeletal- able to move all 4 extremities, no spinal and paraspinal tenderness, steady gait, no use of assistive device, normal range of motion, no leg edema Neurological- no focal deficit, normal reflexes, normal muscle strength, normal sensation to fine touch and vibration Skin- warm and dry Psychiatry- alert and oriented to person, place and time, normal mood and affect  Labs  Lab Results  Component Value Date   HGBA1C 7.2* 08/23/2013   Lipid Panel     Component Value Date/Time   TRIG 295* 08/23/2013 1714   HDL 57 08/23/2013 1714   CHOLHDL 3.9 08/23/2013 1714   LDLCALC 106* 08/23/2013 1714   CMP     Component Value Date/Time   NA 141 08/23/2013 1714   K 3.8 08/23/2013 1714   CL 100 08/23/2013 1714   CO2 23 08/23/2013 1714   GLUCOSE 82 08/23/2013 1714   BUN 18 08/23/2013 1714   CREATININE 0.74 08/23/2013 1714   CALCIUM 9.8 08/23/2013 1714   PROT 7.0 01/04/2013 1703   AST 22 01/04/2013 1703   ALT 36* 01/04/2013 1703   ALKPHOS 76 01/04/2013 1703   BILITOT 0.2 01/04/2013 1703   GFRNONAA 85 08/23/2013 1714   GFRAA 98 08/23/2013 1714   CBC Latest Ref Rng 08/23/2013 01/04/2013  WBC 3.4 - 10.8 x10E3/uL 12.3(H) 10.5  Hemoglobin 11.1 - 15.9 g/dL 11.9 11.5  Hematocrit 34.0 - 46.6 % 35.0 34.1    Assessment/plan  1. Essential hypertension Stable bp reading, continuebenazepril-hctz for now, check renal function - CBC with Differential - CMP  2. Estrogen deficiency Will order dexa scan, pt will provide dates to referral co-ordinator - DG Bone Density; Future  3.  Encounter for screening mammogram for breast cancer - MM Digital Screening; Future  4. Colon cancer screening FOBT card provided with instructions  5. Vitamin D deficiency Continue oscal  6. Type 2 diabetes mellitus without complication Continue metformin, monitor cbg, check a1c and urine for microalbumin today. Continue pravachol and ACEI. counselled about diet and exercise - Hemoglobin A1c - Microalbumin/Creatinine Ratio, Urine  7. Hyperlipidemia LDL goal <100 Check lipid panel, continue pravachol for now - Lipid Panel  8. Breast cancer screening Mammogram to be scheduled, pt to provide dates

## 2014-05-18 LAB — CBC WITH DIFFERENTIAL/PLATELET
BASOS ABS: 0 10*3/uL (ref 0.0–0.2)
Basos: 0 %
EOS ABS: 0.2 10*3/uL (ref 0.0–0.4)
Eos: 2 %
HCT: 34.6 % (ref 34.0–46.6)
Hemoglobin: 11.5 g/dL (ref 11.1–15.9)
IMMATURE GRANS (ABS): 0 10*3/uL (ref 0.0–0.1)
IMMATURE GRANULOCYTES: 0 %
LYMPHS: 50 %
Lymphocytes Absolute: 4.3 10*3/uL — ABNORMAL HIGH (ref 0.7–3.1)
MCH: 29 pg (ref 26.6–33.0)
MCHC: 33.2 g/dL (ref 31.5–35.7)
MCV: 87 fL (ref 79–97)
MONOS ABS: 0.5 10*3/uL (ref 0.1–0.9)
Monocytes: 6 %
NEUTROS PCT: 42 %
Neutrophils Absolute: 3.6 10*3/uL (ref 1.4–7.0)
RBC: 3.96 x10E6/uL (ref 3.77–5.28)
RDW: 14.3 % (ref 12.3–15.4)
WBC: 8.6 10*3/uL (ref 3.4–10.8)

## 2014-05-18 LAB — COMPREHENSIVE METABOLIC PANEL
A/G RATIO: 2 (ref 1.1–2.5)
ALT: 32 IU/L (ref 0–32)
AST: 18 IU/L (ref 0–40)
Albumin: 4.6 g/dL (ref 3.6–4.8)
Alkaline Phosphatase: 87 IU/L (ref 39–117)
BUN/Creatinine Ratio: 17 (ref 11–26)
BUN: 13 mg/dL (ref 8–27)
CALCIUM: 9.5 mg/dL (ref 8.7–10.3)
CHLORIDE: 98 mmol/L (ref 97–108)
CO2: 23 mmol/L (ref 18–29)
Creatinine, Ser: 0.75 mg/dL (ref 0.57–1.00)
GFR calc Af Amer: 96 mL/min/{1.73_m2} (ref >59)
GFR, EST NON AFRICAN AMERICAN: 83 mL/min/{1.73_m2} (ref >59)
GLUCOSE: 137 mg/dL — AB (ref 65–99)
Globulin, Total: 2.3 g/dL (ref 1.5–4.5)
POTASSIUM: 3.9 mmol/L (ref 3.5–5.2)
SODIUM: 139 mmol/L (ref 134–144)
TOTAL PROTEIN: 6.9 g/dL (ref 6.0–8.5)
Total Bilirubin: <0.2 mg/dL (ref 0.0–1.2)

## 2014-05-18 LAB — LIPID PANEL
CHOLESTEROL TOTAL: 158 mg/dL (ref 100–199)
Chol/HDL Ratio: 2.8 ratio units (ref 0.0–4.4)
HDL: 56 mg/dL (ref >39)
LDL Calculated: 70 mg/dL (ref 0–99)
Triglycerides: 159 mg/dL — ABNORMAL HIGH (ref 0–149)
VLDL Cholesterol Cal: 32 mg/dL (ref 5–40)

## 2014-05-18 LAB — HEMOGLOBIN A1C
Est. average glucose Bld gHb Est-mCnc: 166 mg/dL
HEMOGLOBIN A1C: 7.4 % — AB (ref 4.8–5.6)

## 2014-05-18 LAB — MICROALBUMIN / CREATININE URINE RATIO: Creatinine, Ur: 31.3 mg/dL (ref 15.0–278.0)

## 2014-05-19 ENCOUNTER — Telehealth: Payer: Self-pay | Admitting: *Deleted

## 2014-05-19 NOTE — Telephone Encounter (Signed)
Message copied by Eilene Ghazi on Fri May 19, 2014  4:52 PM ------      Message from: Blanchie Serve      Created: Fri May 19, 2014  4:00 PM       a1c suggestive of slightly worsened diabetes. Continue current dose of metformin, encourage exercise  30 min 5 days a week to lose weight. Rest of labs- cholesterol, urine for protein, hemoglobin are normal ------

## 2014-08-07 ENCOUNTER — Other Ambulatory Visit: Payer: Self-pay | Admitting: *Deleted

## 2014-08-07 ENCOUNTER — Other Ambulatory Visit: Payer: Medicare Other

## 2014-08-07 DIAGNOSIS — K589 Irritable bowel syndrome without diarrhea: Secondary | ICD-10-CM

## 2014-08-07 DIAGNOSIS — Z1211 Encounter for screening for malignant neoplasm of colon: Secondary | ICD-10-CM

## 2014-08-09 LAB — FECAL OCCULT BLOOD, IMMUNOCHEMICAL: Fecal Occult Bld: NEGATIVE

## 2014-09-26 ENCOUNTER — Encounter: Payer: Self-pay | Admitting: Internal Medicine

## 2014-10-01 ENCOUNTER — Other Ambulatory Visit: Payer: Self-pay | Admitting: Internal Medicine

## 2014-10-28 ENCOUNTER — Other Ambulatory Visit: Payer: Self-pay | Admitting: Internal Medicine

## 2014-11-14 ENCOUNTER — Ambulatory Visit: Payer: Medicare Other | Admitting: Internal Medicine

## 2014-11-20 ENCOUNTER — Encounter: Payer: Self-pay | Admitting: Nurse Practitioner

## 2014-11-20 ENCOUNTER — Ambulatory Visit (INDEPENDENT_AMBULATORY_CARE_PROVIDER_SITE_OTHER): Payer: Medicare Other | Admitting: Nurse Practitioner

## 2014-11-20 VITALS — BP 110/78 | HR 74 | Temp 98.5°F | Resp 18 | Ht 62.0 in | Wt 128.4 lb

## 2014-11-20 DIAGNOSIS — E119 Type 2 diabetes mellitus without complications: Secondary | ICD-10-CM | POA: Diagnosis not present

## 2014-11-20 DIAGNOSIS — E785 Hyperlipidemia, unspecified: Secondary | ICD-10-CM | POA: Diagnosis not present

## 2014-11-20 DIAGNOSIS — I10 Essential (primary) hypertension: Secondary | ICD-10-CM | POA: Diagnosis not present

## 2014-11-20 DIAGNOSIS — E559 Vitamin D deficiency, unspecified: Secondary | ICD-10-CM | POA: Diagnosis not present

## 2014-11-20 MED ORDER — FREESTYLE LANCETS MISC: Status: DC

## 2014-11-20 MED ORDER — GLUCOSE BLOOD VI STRP: ORAL_STRIP | Status: DC

## 2014-11-20 NOTE — Progress Notes (Signed)
Patient ID: Joann Kennedy, female   DOB: February 02, 1946, 69 y.o.   MRN: [id number]    PCP: Lauree Chandler, NP  No Known Allergies  Chief Complaint  Patient presents with  . Medical Management of Chronic Issues     HPI: Patient is a 69 y.o. female seen in the office today for routine management. Currently staying with her daughter due to new baby and helps her take care of her grandchild. Pt with a pmh of DM, HTN, hyperlipemia. Taking medication has prescribed, no adverse effects. Blood sugars 100-130 (rare to be at 130) mostly 100-110 Has not gotten mammogram, information given to schedule. Reports occasional diarrhea, aware she has lactose intolerance, avoids dairy.  was given something in the past does not know what it was but it did not help.  Advanced Directive information Does patient have an advance directive?: No, Would patient like information on creating an advanced directive?: Yes - Educational materials given Review of Systems:  Review of Systems  Constitutional: Negative for fever, appetite change and fatigue.  HENT: Negative for mouth sores.   Respiratory: Negative for cough and shortness of breath.   Cardiovascular: Negative for chest pain, palpitations and leg swelling.  Gastrointestinal: Positive for diarrhea. Negative for abdominal pain and constipation.  Endocrine: Negative for polydipsia and polyuria.  Genitourinary: Negative for dysuria.  Musculoskeletal: Positive for arthralgias (at times after holding her grandbaby for a long time). Negative for back pain.  Skin: Negative for pallor and rash.  Neurological: Negative for dizziness, seizures, syncope and light-headedness.  Psychiatric/Behavioral: Negative for sleep disturbance and agitation.    Past Medical History  Diagnosis Date  . Type II or unspecified type diabetes mellitus without mention of complication, not stated as uncontrolled   . Hyperlipidemia   . Hypertension   . Dizziness and giddiness   .  Unspecified vitamin D deficiency   . Hypercalcemia   . Hypopotassemia   . Other specified disease of white blood cells   . Other malaise and fatigue   . Disorder of bone and cartilage, unspecified   . Special screening for malignant neoplasms, colon    Past Surgical History  Procedure Laterality Date  . Tonsillectomy  1996  . Fracture surgery  2001    Tibia & fibula   Social History:   reports that she has never smoked. She does not have any smokeless tobacco history on file. She reports that she does not drink alcohol or use illicit drugs.  No family history on file.  Medications: Patient's Medications  New Prescriptions   No medications on file  Previous Medications   BENAZEPRIL-HYDROCHLORTHIAZIDE (LOTENSIN HCT) 20-12.5 MG PER TABLET    take 1 tablet by mouth once daily   CALCIUM-VITAMIN D (OSCAL) 250-125 MG-UNIT PER TABLET    Take 1 tablet by mouth 2 (two) times daily.   GLUCOSE BLOOD TEST STRIP    Use as instructed. Test once daily to control blood sugar. 250.00   LANCETS (ONETOUCH ULTRASOFT) LANCETS    Use as instructed   METFORMIN (GLUCOPHAGE) 1000 MG TABLET    Take 1 tablet (1,000 mg total) by mouth 2 (two) times daily with a meal.   PRAVASTATIN (PRAVACHOL) 80 MG TABLET    Take 1 tablet (80 mg total) by mouth daily.  Modified Medications   Modified Medication Previous Medication   GLUCOSE BLOOD (FREESTYLE LITE) TEST STRIP FREESTYLE LITE test strip      USE AS INSTRUCTED    USE AS INSTRUCTED   LANCETS (  FREESTYLE) LANCETS Lancets (FREESTYLE) lancets      TEST as directed    TEST as directed  Discontinued Medications   No medications on file     Physical Exam:  Filed Vitals:   11/20/14 1307  BP: 110/78  Pulse: 74  Temp: 98.5 F (36.9 C)  TempSrc: Oral  Resp: 18  Height: 5\' 2"  (1.575 m)  Weight: 128 lb 6.4 oz (58.242 kg)  SpO2: 99%    Physical Exam  Constitutional: She is oriented to person, place, and time. She appears well-developed and well-nourished. No  distress.  HENT:  Head: Normocephalic and atraumatic.  Eyes: Conjunctivae are normal. Pupils are equal, round, and reactive to light.  Cardiovascular: Normal rate, regular rhythm and normal heart sounds.   Pulmonary/Chest: Effort normal and breath sounds normal.  Abdominal: Soft. Bowel sounds are normal.  Musculoskeletal: She exhibits no edema or tenderness.  Neurological: She is alert and oriented to person, place, and time.  Skin: Skin is warm and dry. She is not diaphoretic.  Psychiatric: She has a normal mood and affect.    Labs reviewed: Basic Metabolic Panel:  Recent Labs  05/17/14 1315  NA 139  K 3.9  CL 98  CO2 23  GLUCOSE 137*  BUN 13  CREATININE 0.75  CALCIUM 9.5   Liver Function Tests:  Recent Labs  05/17/14 1315  AST 18  ALT 32  ALKPHOS 87  BILITOT <0.2  PROT 6.9   No results for input(s): LIPASE, AMYLASE in the last 8760 hours. No results for input(s): AMMONIA in the last 8760 hours. CBC:  Recent Labs  05/17/14 1315  WBC 8.6  NEUTROABS 3.6  HGB 11.5  HCT 34.6  MCV 87   Lipid Panel:  Recent Labs  05/17/14 1315  CHOL 158  HDL 56  LDLCALC 70  TRIG 159*  CHOLHDL 2.8   TSH: No results for input(s): TSH in the last 8760 hours. A1C: Lab Results  Component Value Date   HGBA1C 7.4* 05/17/2014     Assessment/Plan  1. Essential hypertension -blood pressure well controlled on current medication, to cont benazepril-hctz -will follow up lab work today  2. Type 2 diabetes mellitus without complication -does not follow any sort of diet, blood sugars appear to be in appropriate range based on report (pt did not bring log). conts on metformin BID - Comprehensive metabolic panel - Hemoglobin A1c  3. Vitamin D deficiency -conts oscal  4. Hyperlipidemia - conts Pravachol, LDL at goal in October  - Lipid panel prior to next visit    Follow up in 6 months for EV with MMSE and fasting blood work prior to visit.

## 2014-11-20 NOTE — Patient Instructions (Signed)
Follow up in 6 months for extended visit with fasting blood work prior to visit   May use bene-fiber to help with stools  Diabetes Mellitus and Food It is important for you to manage your blood sugar (glucose) level. Your blood glucose level can be greatly affected by what you eat. Eating healthier foods in the appropriate amounts throughout the day at about the same time each day will help you control your blood glucose level. It can also help slow or prevent worsening of your diabetes mellitus. Healthy eating may even help you improve the level of your blood pressure and reach or maintain a healthy weight.  HOW CAN FOOD AFFECT ME? Carbohydrates Carbohydrates affect your blood glucose level more than any other type of food. Your dietitian will help you determine how many carbohydrates to eat at each meal and teach you how to count carbohydrates. Counting carbohydrates is important to keep your blood glucose at a healthy level, especially if you are using insulin or taking certain medicines for diabetes mellitus. Alcohol Alcohol can cause sudden decreases in blood glucose (hypoglycemia), especially if you use insulin or take certain medicines for diabetes mellitus. Hypoglycemia can be a life-threatening condition. Symptoms of hypoglycemia (sleepiness, dizziness, and disorientation) are similar to symptoms of having too much alcohol.  If your health care provider has given you approval to drink alcohol, do so in moderation and use the following guidelines:  Women should not have more than one drink per day, and men should not have more than two drinks per day. One drink is equal to:  12 oz of beer.  5 oz of wine.  1 oz of hard liquor.  Do not drink on an empty stomach.  Keep yourself hydrated. Have water, diet soda, or unsweetened iced tea.  Regular soda, juice, and other mixers might contain a lot of carbohydrates and should be counted. WHAT FOODS ARE NOT RECOMMENDED? As you make food  choices, it is important to remember that all foods are not the same. Some foods have fewer nutrients per serving than other foods, even though they might have the same number of calories or carbohydrates. It is difficult to get your body what it needs when you eat foods with fewer nutrients. Examples of foods that you should avoid that are high in calories and carbohydrates but low in nutrients include:  Trans fats (most processed foods list trans fats on the Nutrition Facts label).  Regular soda.  Juice.  Candy.  Sweets, such as cake, pie, doughnuts, and cookies.  Fried foods. WHAT FOODS CAN I EAT? Have nutrient-rich foods, which will nourish your body and keep you healthy. The food you should eat also will depend on several factors, including:  The calories you need.  The medicines you take.  Your weight.  Your blood glucose level.  Your blood pressure level.  Your cholesterol level. You also should eat a variety of foods, including:  Protein, such as meat, poultry, fish, tofu, nuts, and seeds (lean animal proteins are best).  Fruits.  Vegetables.  Dairy products, such as milk, cheese, and yogurt (low fat is best).  Breads, grains, pasta, cereal, rice, and beans.  Fats such as olive oil, trans fat-free margarine, canola oil, avocado, and olives. DOES EVERYONE WITH DIABETES MELLITUS HAVE THE SAME MEAL PLAN? Because every person with diabetes mellitus is different, there is not one meal plan that works for everyone. It is very important that you meet with a dietitian who will help you create  a meal plan that is just right for you. Document Released: 04/17/2005 Document Revised: 07/26/2013 Document Reviewed: 06/17/2013 [facility] Patient Information 2015 [employer] [employer], [place]. This information is not intended to replace advice given to you by your health care provider. Make sure you discuss any questions you have with your health care provider.

## 2014-11-21 LAB — CBC WITH DIFFERENTIAL/PLATELET
Basophils Absolute: 0.1 10*3/uL (ref 0.0–0.2)
Basos: 1 %
Eos: 1 %
Eosinophils Absolute: 0.1 10*3/uL (ref 0.0–0.4)
HCT: 32 % — ABNORMAL LOW (ref 34.0–46.6)
Hemoglobin: 10.9 g/dL — ABNORMAL LOW (ref 11.1–15.9)
Immature Grans (Abs): 0 10*3/uL (ref 0.0–0.1)
Immature Granulocytes: 0 %
LYMPHS: 54 %
Lymphocytes Absolute: 5 10*3/uL — ABNORMAL HIGH (ref 0.7–3.1)
MCH: 29.3 pg (ref 26.6–33.0)
MCHC: 34.1 g/dL (ref 31.5–35.7)
MCV: 86 fL (ref 79–97)
Monocytes Absolute: 0.6 10*3/uL (ref 0.1–0.9)
Monocytes: 6 %
NEUTROS ABS: 3.5 10*3/uL (ref 1.4–7.0)
Neutrophils Relative %: 38 %
Platelets: 464 10*3/uL — ABNORMAL HIGH (ref 150–379)
RBC: 3.72 x10E6/uL — AB (ref 3.77–5.28)
RDW: 14.3 % (ref 12.3–15.4)
WBC: 9.3 10*3/uL (ref 3.4–10.8)

## 2014-11-21 LAB — LIPID PANEL
Chol/HDL Ratio: 2.9 ratio units (ref 0.0–4.4)
Cholesterol, Total: 171 mg/dL (ref 100–199)
HDL: 60 mg/dL (ref >39)
LDL Calculated: 80 mg/dL (ref 0–99)
TRIGLYCERIDES: 156 mg/dL — AB (ref 0–149)
VLDL Cholesterol Cal: 31 mg/dL (ref 5–40)

## 2014-11-23 LAB — COMPREHENSIVE METABOLIC PANEL
ALT: 21 IU/L (ref 0–32)
AST: 17 IU/L (ref 0–40)
Albumin/Globulin Ratio: 1.8 (ref 1.1–2.5)
Albumin: 4.6 g/dL (ref 3.6–4.8)
Alkaline Phosphatase: 79 IU/L (ref 39–117)
BUN / CREAT RATIO: 28 — AB (ref 11–26)
BUN: 18 mg/dL (ref 8–27)
Bilirubin Total: <0.2 mg/dL (ref 0.0–1.2)
CO2: 24 mmol/L (ref 18–29)
Calcium: 9.7 mg/dL (ref 8.7–10.3)
Chloride: 101 mmol/L (ref 97–108)
Creatinine, Ser: 0.64 mg/dL (ref 0.57–1.00)
GFR calc Af Amer: 107 mL/min/{1.73_m2} (ref >59)
GFR calc non Af Amer: 93 mL/min/{1.73_m2} (ref >59)
GLOBULIN, TOTAL: 2.5 g/dL (ref 1.5–4.5)
Glucose: 83 mg/dL (ref 65–99)
Potassium: 4.1 mmol/L (ref 3.5–5.2)
Sodium: 141 mmol/L (ref 134–144)
Total Protein: 7.1 g/dL (ref 6.0–8.5)

## 2014-11-23 LAB — HEMOGLOBIN A1C
ESTIMATED AVERAGE GLUCOSE: 160 mg/dL
Hgb A1c MFr Bld: 7.2 % — ABNORMAL HIGH (ref 4.8–5.6)

## 2015-01-01 ENCOUNTER — Emergency Department (HOSPITAL_COMMUNITY)
Admission: EM | Admit: 2015-01-01 | Discharge: 2015-01-01 | Disposition: A | Discharge status: Home / Self Care | Payer: Medicare Other | Attending: Emergency Medicine | Admitting: Emergency Medicine

## 2015-01-01 ENCOUNTER — Encounter (HOSPITAL_COMMUNITY): Payer: Self-pay | Admitting: Emergency Medicine

## 2015-01-01 DIAGNOSIS — Z79899 Other long term (current) drug therapy: Secondary | ICD-10-CM | POA: Diagnosis not present

## 2015-01-01 DIAGNOSIS — Y92000 Kitchen of unspecified non-institutional (private) residence as  the place of occurrence of the external cause: Secondary | ICD-10-CM | POA: Diagnosis not present

## 2015-01-01 DIAGNOSIS — Y9389 Activity, other specified: Secondary | ICD-10-CM | POA: Insufficient documentation

## 2015-01-01 DIAGNOSIS — I10 Essential (primary) hypertension: Secondary | ICD-10-CM | POA: Insufficient documentation

## 2015-01-01 DIAGNOSIS — E119 Type 2 diabetes mellitus without complications: Secondary | ICD-10-CM | POA: Diagnosis not present

## 2015-01-01 DIAGNOSIS — Z23 Encounter for immunization: Secondary | ICD-10-CM | POA: Insufficient documentation

## 2015-01-01 DIAGNOSIS — E785 Hyperlipidemia, unspecified: Secondary | ICD-10-CM | POA: Diagnosis not present

## 2015-01-01 DIAGNOSIS — W260XXA Contact with knife, initial encounter: Secondary | ICD-10-CM | POA: Diagnosis not present

## 2015-01-01 DIAGNOSIS — E876 Hypokalemia: Secondary | ICD-10-CM | POA: Diagnosis not present

## 2015-01-01 DIAGNOSIS — Y998 Other external cause status: Secondary | ICD-10-CM | POA: Diagnosis not present

## 2015-01-01 DIAGNOSIS — Z862 Personal history of diseases of the blood and blood-forming organs and certain disorders involving the immune mechanism: Secondary | ICD-10-CM | POA: Insufficient documentation

## 2015-01-01 DIAGNOSIS — Z8739 Personal history of other diseases of the musculoskeletal system and connective tissue: Secondary | ICD-10-CM | POA: Insufficient documentation

## 2015-01-01 DIAGNOSIS — Z794 Long term (current) use of insulin: Secondary | ICD-10-CM | POA: Insufficient documentation

## 2015-01-01 DIAGNOSIS — S71111A Laceration without foreign body, right thigh, initial encounter: Secondary | ICD-10-CM | POA: Diagnosis not present

## 2015-01-01 MED ORDER — BACITRACIN ZINC 500 UNIT/GM EX OINT: 1.0000 "application " | TOPICAL_OINTMENT | Freq: Two times a day (BID) | CUTANEOUS | Status: DC

## 2015-01-01 MED ORDER — TETANUS-DIPHTH-ACELL PERTUSSIS 5-2.5-18.5 LF-MCG/0.5 IM SUSP
0.5000 mL | Freq: Once | INTRAMUSCULAR | Status: AC
  Administered 2015-01-01: 0.5 mL via INTRAMUSCULAR
  Filled 2015-01-01: qty 0.5

## 2015-01-01 MED ORDER — CEPHALEXIN 500 MG PO CAPS: 500.0000 mg | ORAL_CAPSULE | Freq: Four times a day (QID) | ORAL | Status: DC

## 2015-01-01 MED ORDER — LIDOCAINE-EPINEPHRINE 2 %-1:100000 IJ SOLN
20.0000 mL | Freq: Once | INTRAMUSCULAR | Status: AC
  Administered 2015-01-01: 20 mL
  Filled 2015-01-01: qty 1

## 2015-01-01 NOTE — ED Provider Notes (Signed)
CSN: [id number]     Arrival date & time 01/01/15  1949 History   First MD Initiated Contact with Patient 01/01/15 2008    This chart was scribed for non-physician practitioner, Antonietta Breach, PA, working with Malvin Johns, MD by Terressa Koyanagi, ED Scribe. This patient was seen in room WTR7/WTR7 and the patient's care was started at 8:18 PM.  Chief Complaint  Patient presents with  . Extremity Laceration   The history is provided by the patient. No language interpreter was used.   PCP: Lauree Chandler, NP HPI Comments: Joann Kennedy is a 69 y.o. female, with PMH noted below including DMTII, who presents to the Emergency Department complaining of laceration to the left thigh onset yesterday evening after pt accidentally cut herself with a knife. Pt also complains of associated pain to the affected area with movement. Pain is mild and intermittent. Pt denies any drainage from the affected area. Pt reports putting neosporin in the affected area. Patient's last tetanus vaccine is unknown. Pt denies numbness, tingling, or any other symptoms at this time.   Past Medical History  Diagnosis Date  . Type II or unspecified type diabetes mellitus without mention of complication, not stated as uncontrolled   . Hyperlipidemia   . Hypertension   . Dizziness and giddiness   . Unspecified vitamin D deficiency   . Hypercalcemia   . Hypopotassemia   . Other specified disease of white blood cells   . Other malaise and fatigue   . Disorder of bone and cartilage, unspecified   . Special screening for malignant neoplasms, colon    Past Surgical History  Procedure Laterality Date  . Tonsillectomy  1996  . Fracture surgery  2001    Tibia & fibula   No family history on file. History  Substance Use Topics  . Smoking status: Never Smoker   . Smokeless tobacco: Not on file  . Alcohol Use: No   OB History    No data available      Review of Systems  Constitutional: Negative for fever and chills.   Musculoskeletal: Negative for gait problem.  Skin: Positive for wound (laceration to right anterior thigh).  Neurological: Negative for numbness.  All other systems reviewed and are negative.   Allergies  Review of patient's allergies indicates no known allergies.  Home Medications   Prior to Admission medications   Medication Sig Start Date End Date Taking? Authorizing Provider  bacitracin ointment Apply 1 application topically 2 (two) times daily. 01/01/15   Antonietta Breach, PA-C  benazepril-hydrochlorthiazide (LOTENSIN HCT) 20-12.5 MG per tablet take 1 tablet by mouth once daily 10/02/14   Blanchie Serve, MD  calcium-vitamin D (OSCAL) 250-125 MG-UNIT per tablet Take 1 tablet by mouth 2 (two) times daily. 08/23/13   Blanchie Serve, MD  cephALEXin (KEFLEX) 500 MG capsule Take 1 capsule (500 mg total) by mouth 4 (four) times daily. 01/01/15   Antonietta Breach, PA-C  glucose blood (FREESTYLE LITE) test strip USE AS INSTRUCTED 11/20/14   Lauree Chandler, NP  glucose blood test strip Use as instructed. Test once daily to control blood sugar. 250.00 01/04/13   Blanchie Serve, MD  Lancets (FREESTYLE) lancets TEST as directed 11/20/14   Lauree Chandler, NP  Lancets [facility] ULTRASOFT) lancets Use as instructed 01/04/13   Blanchie Serve, MD  metFORMIN (GLUCOPHAGE) 1000 MG tablet Take 1 tablet (1,000 mg total) by mouth 2 (two) times daily with a meal. 10/18/13   Blanchie Serve, MD  pravastatin (PRAVACHOL)  80 MG tablet Take 1 tablet (80 mg total) by mouth daily. 10/18/13   Blanchie Serve, MD   Triage Vitals: BP 113/99 mmHg  Pulse 74  Temp(Src) 97.9 F (36.6 C) (Oral)  Resp 16  SpO2 100%  Physical Exam  Constitutional: She is oriented to person, place, and time. She appears well-developed and well-nourished. No distress.  HENT:  Head: Normocephalic and atraumatic.  Eyes: Conjunctivae and EOM are normal. No scleral icterus.  Neck: Normal range of motion.  Pulmonary/Chest: Effort normal. No respiratory  distress.  Musculoskeletal: Normal range of motion. She exhibits tenderness.       Right upper leg: She exhibits tenderness and laceration. She exhibits no bony tenderness, no swelling, no edema and no deformity.       Legs: TTP at laceration site  Neurological: She is alert and oriented to person, place, and time. She exhibits normal muscle tone. Coordination normal.  Skin: Skin is warm and dry. No rash noted. She is not diaphoretic. No erythema. No pallor.  1cm laceration to anterior mid R thigh. Subcutaneous fat noted. No erythema, purulence, or induration. No active bleeding.  Psychiatric: She has a normal mood and affect. Her behavior is normal.  Nursing note and vitals reviewed.   ED Course  Procedures (including critical care time) DIAGNOSTIC STUDIES: Oxygen Saturation is 100% on RA, nl by my interpretation.    COORDINATION OF CARE: 8:22 PM-Discussed treatment plan which includes laceration repair and antibiotics with pt. Also discussed the risks of laceration repair of wounds older than 8 hours. Patient verbalizes understanding and agrees with treatment plan.  Labs Review Labs Reviewed - No data to display  Imaging Review No results found.   EKG Interpretation None      LACERATION REPAIR Performed by: Antonietta Breach Authorized by: Antonietta Breach Consent: Verbal consent obtained. Risks and benefits: risks, benefits and alternatives were discussed Consent given by: patient Patient identity confirmed: provided demographic data Prepped and Draped in normal sterile fashion Wound explored  Laceration Location: R thigh  Laceration Length: 1cm  No Foreign Bodies seen or palpated  Anesthesia: local infiltration  Local anesthetic: lidocaine 2% with epinephrine  Anesthetic total: 4 ml  Irrigation method: syringe Amount of cleaning: standard  Skin closure: 4-0 ethilon  Number of sutures: 3  Technique: simple interrupted  Patient tolerance: Patient tolerated the  procedure well with no immediate complications.  MDM   Final diagnoses:  Thigh laceration, right, initial encounter    Tdap booster given. Pressure irrigation performed. Laceration occurred 24 hours prior to loose approximation of the wound which was well tolerated. Risks of closure discussed with the patient prior to repair including increased risk of infection. Patient opted to proceed despite these risks; she had the capacity to make this decision. Patient also with hx of DM; will place on Keflex course and bacitracin to cover for infection. Discussed suture home care with pt and answered questions. Pt to follow up for wound check and suture removal in 10-12 days. Return precautions discussed and provided. Patient agreeable to plan with no unaddressed concerns.  I personally performed the services described in this documentation, which was scribed in my presence. The recorded information has been reviewed and is accurate.   Filed Vitals:   01/01/15 1958  BP: 113/99  Pulse: 74  Temp: 97.9 F (36.6 C)  TempSrc: Oral  Resp: 16  SpO2: 100%      Antonietta Breach, PA-C 01/01/15 2132  Malvin Johns, MD 01/02/15 0001

## 2015-01-01 NOTE — Discharge Instructions (Signed)
Keep the area clean and dry. Change your dressing at least once per day. Apply bacitracin as prescribed. Take Keflex to prevent infection. Return for suture removal in 10-12 days. Follow-up with your primary doctor as needed.  Laceration Care, Adult A laceration is a cut or lesion that goes through all layers of the skin and into the tissue just beneath the skin. TREATMENT  Some lacerations may not require closure. Some lacerations may not be able to be closed due to an increased risk of infection. It is important to see your caregiver as soon as possible after an injury to minimize the risk of infection and maximize the opportunity for successful closure. If closure is appropriate, pain medicines may be given, if needed. The wound will be cleaned to help prevent infection. Your caregiver will use stitches (sutures), staples, wound glue (adhesive), or skin adhesive strips to repair the laceration. These tools bring the skin edges together to allow for faster healing and a better cosmetic outcome. However, all wounds will heal with a scar. Once the wound has healed, scarring can be minimized by covering the wound with sunscreen during the day for 1 full year. HOME CARE INSTRUCTIONS  For sutures or staples:  Keep the wound clean and dry.  If you were given a bandage (dressing), you should change it at least once a day. Also, change the dressing if it becomes wet or dirty, or as directed by your caregiver.  Wash the wound with soap and water 2 times a day. Rinse the wound off with water to remove all soap. Pat the wound dry with a clean towel.  After cleaning, apply a thin layer of the antibiotic ointment as recommended by your caregiver. This will help prevent infection and keep the dressing from sticking.  You may shower as usual after the first 24 hours. Do not soak the wound in water until the sutures are removed.  Only take over-the-counter or prescription medicines for pain, discomfort, or  fever as directed by your caregiver.  Get your sutures or staples removed as directed by your caregiver. For skin adhesive strips:  Keep the wound clean and dry.  Do not get the skin adhesive strips wet. You may bathe carefully, using caution to keep the wound dry.  If the wound gets wet, pat it dry with a clean towel.  Skin adhesive strips will fall off on their own. You may trim the strips as the wound heals. Do not remove skin adhesive strips that are still stuck to the wound. They will fall off in time. For wound adhesive:  You may briefly wet your wound in the shower or bath. Do not soak or scrub the wound. Do not swim. Avoid periods of heavy perspiration until the skin adhesive has fallen off on its own. After showering or bathing, gently pat the wound dry with a clean towel.  Do not apply liquid medicine, cream medicine, or ointment medicine to your wound while the skin adhesive is in place. This may loosen the film before your wound is healed.  If a dressing is placed over the wound, be careful not to apply tape directly over the skin adhesive. This may cause the adhesive to be pulled off before the wound is healed.  Avoid prolonged exposure to sunlight or tanning lamps while the skin adhesive is in place. Exposure to ultraviolet light in the first year will darken the scar.  The skin adhesive will usually remain in place for 5 to 10 days,  then naturally fall off the skin. Do not pick at the adhesive film. You may need a tetanus shot if:  You cannot remember when you had your last tetanus shot.  You have never had a tetanus shot. If you get a tetanus shot, your arm may swell, get red, and feel warm to the touch. This is common and not a problem. If you need a tetanus shot and you choose not to have one, there is a rare chance of getting tetanus. Sickness from tetanus can be serious. SEEK MEDICAL CARE IF:   You have redness, swelling, or increasing pain in the wound.  You see  a red line that goes away from the wound.  You have yellowish-white fluid (pus) coming from the wound.  You have a fever.  You notice a bad smell coming from the wound or dressing.  Your wound breaks open before or after sutures have been removed.  You notice something coming out of the wound such as wood or glass.  Your wound is on your hand or foot and you cannot move a finger or toe. SEEK IMMEDIATE MEDICAL CARE IF:   Your pain is not controlled with prescribed medicine.  You have severe swelling around the wound causing pain and numbness or a change in color in your arm, hand, leg, or foot.  Your wound splits open and starts bleeding.  You have worsening numbness, weakness, or loss of function of any joint around or beyond the wound.  You develop painful lumps near the wound or on the skin anywhere on your body. MAKE SURE YOU:   Understand these instructions.  Will watch your condition.  Will get help right away if you are not doing well or get worse. Document Released: 07/21/2005 Document Revised: 10/13/2011 Document Reviewed: 01/14/2011 [facility] Patient Information 2015 [place], [place]. This information is not intended to replace advice given to you by your health care provider. Make sure you discuss any questions you have with your health care provider.

## 2015-01-01 NOTE — ED Notes (Signed)
Pt c/o 3/4 inch laceration to R anterior thigh after accidentally cutting herself with a knife while in the kitchen. Bleeding controlled by band aid. Pt denies numbness or tingling. A&Ox4 and ambulatory.

## 2015-01-10 ENCOUNTER — Emergency Department (HOSPITAL_COMMUNITY)
Admission: EM | Admit: 2015-01-10 | Discharge: 2015-01-10 | Disposition: A | Discharge status: Home / Self Care | Payer: Medicare Other | Attending: Emergency Medicine | Admitting: Emergency Medicine

## 2015-01-10 ENCOUNTER — Encounter (HOSPITAL_COMMUNITY): Payer: Self-pay | Admitting: Emergency Medicine

## 2015-01-10 DIAGNOSIS — Z792 Long term (current) use of antibiotics: Secondary | ICD-10-CM | POA: Insufficient documentation

## 2015-01-10 DIAGNOSIS — E785 Hyperlipidemia, unspecified: Secondary | ICD-10-CM | POA: Diagnosis not present

## 2015-01-10 DIAGNOSIS — Z4802 Encounter for removal of sutures: Secondary | ICD-10-CM | POA: Insufficient documentation

## 2015-01-10 DIAGNOSIS — Z8739 Personal history of other diseases of the musculoskeletal system and connective tissue: Secondary | ICD-10-CM | POA: Diagnosis not present

## 2015-01-10 DIAGNOSIS — E119 Type 2 diabetes mellitus without complications: Secondary | ICD-10-CM | POA: Diagnosis not present

## 2015-01-10 DIAGNOSIS — Z862 Personal history of diseases of the blood and blood-forming organs and certain disorders involving the immune mechanism: Secondary | ICD-10-CM | POA: Diagnosis not present

## 2015-01-10 DIAGNOSIS — I10 Essential (primary) hypertension: Secondary | ICD-10-CM | POA: Diagnosis not present

## 2015-01-10 DIAGNOSIS — Z79899 Other long term (current) drug therapy: Secondary | ICD-10-CM | POA: Insufficient documentation

## 2015-01-10 MED ORDER — BACITRACIN ZINC 500 UNIT/GM EX OINT
1.0000 "application " | TOPICAL_OINTMENT | Freq: Two times a day (BID) | CUTANEOUS | Status: DC
  Administered 2015-01-10: 1 via TOPICAL

## 2015-01-10 NOTE — ED Notes (Signed)
Pt here to get stitches out of her right thigh. Pt denies any drainage or pain.

## 2015-01-10 NOTE — Discharge Instructions (Signed)

## 2015-01-10 NOTE — ED Provider Notes (Signed)
CSN: [id number]     Arrival date & time 01/10/15  1740 History   First MD Initiated Contact with Patient 01/10/15 1751     Chief Complaint  Patient presents with  . Suture / Staple Removal     (Consider location/radiation/quality/duration/timing/severity/associated sxs/prior Treatment) HPI Comments: Patient presents emergency department with chief complaint of suture removal. States that she cut her leg about 10 days ago and had sutures placed. She states that it feels well now. She denies any fevers chills. Denies any problem, pain, or drainage at the site. No other complaints at this time.  The history is provided by the patient. No language interpreter was used.    Past Medical History  Diagnosis Date  . Type II or unspecified type diabetes mellitus without mention of complication, not stated as uncontrolled   . Hyperlipidemia   . Hypertension   . Dizziness and giddiness   . Unspecified vitamin D deficiency   . Hypercalcemia   . Hypopotassemia   . Other specified disease of white blood cells   . Other malaise and fatigue   . Disorder of bone and cartilage, unspecified   . Special screening for malignant neoplasms, colon    Past Surgical History  Procedure Laterality Date  . Tonsillectomy  1996  . Fracture surgery  2001    Tibia & fibula   No family history on file. History  Substance Use Topics  . Smoking status: Never Smoker   . Smokeless tobacco: Not on file  . Alcohol Use: No   OB History    No data available     Review of Systems  Constitutional: Negative for fever and chills.  Respiratory: Negative for shortness of breath.   Cardiovascular: Negative for chest pain.  Gastrointestinal: Negative for nausea, vomiting, diarrhea and constipation.  Genitourinary: Negative for dysuria.  Skin: Positive for wound.      Allergies  Review of patient's allergies indicates no known allergies.  Home Medications   Prior to Admission medications   Medication Sig  Start Date End Date Taking? Authorizing Provider  bacitracin ointment Apply 1 application topically 2 (two) times daily. 01/01/15   Antonietta Breach, PA-C  benazepril-hydrochlorthiazide (LOTENSIN HCT) 20-12.5 MG per tablet take 1 tablet by mouth once daily 10/02/14   Blanchie Serve, MD  calcium-vitamin D (OSCAL) 250-125 MG-UNIT per tablet Take 1 tablet by mouth 2 (two) times daily. 08/23/13   Blanchie Serve, MD  cephALEXin (KEFLEX) 500 MG capsule Take 1 capsule (500 mg total) by mouth 4 (four) times daily. 01/01/15   Antonietta Breach, PA-C  glucose blood (FREESTYLE LITE) test strip USE AS INSTRUCTED 11/20/14   Lauree Chandler, NP  glucose blood test strip Use as instructed. Test once daily to control blood sugar. 250.00 01/04/13   Blanchie Serve, MD  Lancets (FREESTYLE) lancets TEST as directed 11/20/14   Lauree Chandler, NP  Lancets [facility] ULTRASOFT) lancets Use as instructed 01/04/13   Blanchie Serve, MD  metFORMIN (GLUCOPHAGE) 1000 MG tablet Take 1 tablet (1,000 mg total) by mouth 2 (two) times daily with a meal. 10/18/13   Blanchie Serve, MD  pravastatin (PRAVACHOL) 80 MG tablet Take 1 tablet (80 mg total) by mouth daily. 10/18/13   Mahima Pandey, MD   BP 156/76 mmHg  Pulse 83  Temp(Src) 97.9 F (36.6 C) (Oral)  Resp 19  SpO2 99% Physical Exam  Constitutional: She is oriented to person, place, and time. She appears well-developed and well-nourished.  HENT:  Head: Normocephalic and atraumatic.  Eyes: Conjunctivae and EOM are normal.  Neck: Normal range of motion.  Cardiovascular: Normal rate.   Pulmonary/Chest: Effort normal.  Abdominal: She exhibits no distension.  Musculoskeletal: Normal range of motion.  Neurological: She is alert and oriented to person, place, and time.  Skin: Skin is dry.  2 cm well healing laceration right upper thigh, sutures are intact  Psychiatric: She has a normal mood and affect. Her behavior is normal. Judgment and thought content normal.  Nursing note and vitals  reviewed.   ED Course  Procedures (including critical care time) Labs Review Labs Reviewed - No data to display  Imaging Review No results found.   EKG Interpretation None     SUTURE REMOVAL Performed by: Montine Circle  Consent: Verbal consent obtained. Patient identity confirmed: provided demographic data Time out: Immediately prior to procedure a "time out" was called to verify the correct patient, procedure, equipment, support staff and site/side marked as required.  Location details: right upper thigh  Wound Appearance: clean  Sutures/Staples Removed: 3  Facility: sutures placed in this facility Patient tolerance: Patient tolerated the procedure well with no immediate complications.    MDM   Final diagnoses:  Visit for suture removal    Well-healing wound.  Sutures removed without difficulty.  DC to home.    Montine Circle, PA-C 01/10/15 [place], MD 01/11/15 [phone]

## 2015-01-29 ENCOUNTER — Other Ambulatory Visit: Payer: Self-pay | Admitting: Internal Medicine

## 2015-02-26 ENCOUNTER — Other Ambulatory Visit: Payer: Self-pay | Admitting: Internal Medicine

## 2015-04-24 DIAGNOSIS — R1310 Dysphagia, unspecified: Secondary | ICD-10-CM | POA: Diagnosis not present

## 2015-04-24 DIAGNOSIS — Z1211 Encounter for screening for malignant neoplasm of colon: Secondary | ICD-10-CM | POA: Diagnosis not present

## 2015-04-24 DIAGNOSIS — R194 Change in bowel habit: Secondary | ICD-10-CM | POA: Diagnosis not present

## 2015-04-24 DIAGNOSIS — D509 Iron deficiency anemia, unspecified: Secondary | ICD-10-CM | POA: Diagnosis not present

## 2015-05-13 ENCOUNTER — Other Ambulatory Visit: Payer: Self-pay | Admitting: Internal Medicine

## 2015-05-14 DIAGNOSIS — K228 Other specified diseases of esophagus: Secondary | ICD-10-CM | POA: Diagnosis not present

## 2015-05-14 DIAGNOSIS — R197 Diarrhea, unspecified: Secondary | ICD-10-CM | POA: Diagnosis not present

## 2015-05-14 DIAGNOSIS — K3189 Other diseases of stomach and duodenum: Secondary | ICD-10-CM | POA: Diagnosis not present

## 2015-05-14 DIAGNOSIS — K5730 Diverticulosis of large intestine without perforation or abscess without bleeding: Secondary | ICD-10-CM | POA: Diagnosis not present

## 2015-05-14 DIAGNOSIS — R1310 Dysphagia, unspecified: Secondary | ICD-10-CM | POA: Diagnosis not present

## 2015-05-14 DIAGNOSIS — K6389 Other specified diseases of intestine: Secondary | ICD-10-CM | POA: Diagnosis not present

## 2015-05-14 DIAGNOSIS — Z1211 Encounter for screening for malignant neoplasm of colon: Secondary | ICD-10-CM | POA: Diagnosis not present

## 2015-05-14 HISTORY — PX: COLONOSCOPY: SHX174

## 2015-05-14 LAB — HM COLONOSCOPY

## 2015-05-15 ENCOUNTER — Encounter: Payer: Self-pay | Admitting: *Deleted

## 2015-07-27 ENCOUNTER — Telehealth: Payer: Self-pay

## 2015-07-27 NOTE — Telephone Encounter (Signed)
-----   Message from Lauree Chandler, NP sent at 07/27/2015 11:52 AM EST ----- Pt over due to follow up labs, please call and schedule lab work and appt  ----- Message -----    From: SYSTEM    Sent: 07/27/2015  12:05 AM      To: Lauree Chandler, NP

## 2015-07-27 NOTE — Telephone Encounter (Signed)
Left message on machine for patient to call and confirm if she is a patient at this office, if yes patient will need to schedule an appointment.

## 2015-08-07 ENCOUNTER — Ambulatory Visit (INDEPENDENT_AMBULATORY_CARE_PROVIDER_SITE_OTHER): Payer: Medicare Other | Admitting: Nurse Practitioner

## 2015-08-07 ENCOUNTER — Encounter: Payer: Self-pay | Admitting: Nurse Practitioner

## 2015-08-07 VITALS — BP 128/82 | HR 71 | Temp 98.0°F | Resp 20 | Ht 62.0 in | Wt 132.6 lb

## 2015-08-07 DIAGNOSIS — E785 Hyperlipidemia, unspecified: Secondary | ICD-10-CM | POA: Diagnosis not present

## 2015-08-07 DIAGNOSIS — I10 Essential (primary) hypertension: Secondary | ICD-10-CM | POA: Diagnosis not present

## 2015-08-07 DIAGNOSIS — E119 Type 2 diabetes mellitus without complications: Secondary | ICD-10-CM | POA: Diagnosis not present

## 2015-08-07 DIAGNOSIS — J029 Acute pharyngitis, unspecified: Secondary | ICD-10-CM

## 2015-08-07 NOTE — Patient Instructions (Signed)
To use tylenol 650 mg by mouth every 6 hours as needed sore throat To drink warm drink with lemon and honey to help May use chloraseptic spray as needed   Please make lab appt for tomorrow and follow up with Dr Mariea Clonts for routine follow up    Pharyngitis Pharyngitis is redness, pain, and swelling (inflammation) of your pharynx.  CAUSES  Pharyngitis is usually caused by infection. Most of the time, these infections are from viruses (viral) and are part of a cold. However, sometimes pharyngitis is caused by bacteria (bacterial). Pharyngitis can also be caused by allergies. Viral pharyngitis may be spread from person to person by coughing, sneezing, and personal items or utensils (cups, forks, spoons, toothbrushes). Bacterial pharyngitis may be spread from person to person by more intimate contact, such as kissing.  SIGNS AND SYMPTOMS  Symptoms of pharyngitis include:   Sore throat.   Tiredness (fatigue).   Low-grade fever.   Headache.  Joint pain and muscle aches.  Skin rashes.  Swollen lymph nodes.  Plaque-like film on throat or tonsils (often seen with bacterial pharyngitis). DIAGNOSIS  Your health care provider will ask you questions about your illness and your symptoms. Your medical history, along with a physical exam, is often all that is needed to diagnose pharyngitis. Sometimes, a rapid strep test is done. Other lab tests may also be done, depending on the suspected cause.  TREATMENT  Viral pharyngitis will usually get better in 3-4 days without the use of medicine. Bacterial pharyngitis is treated with medicines that kill germs (antibiotics).  HOME CARE INSTRUCTIONS   Drink enough water and fluids to keep your urine clear or pale yellow.   Only take over-the-counter or prescription medicines as directed by your health care provider:   If you are prescribed antibiotics, make sure you finish them even if you start to feel better.   Do not take aspirin.   Get lots  of rest.   Gargle with 8 oz of salt water ( tsp of salt per 1 qt of water) as often as every 1-2 hours to soothe your throat.   Throat lozenges (if you are not at risk for choking) or sprays may be used to soothe your throat. SEEK MEDICAL CARE IF:   You have large, tender lumps in your neck.  You have a rash.  You cough up green, yellow-brown, or bloody spit. SEEK IMMEDIATE MEDICAL CARE IF:   Your neck becomes stiff.  You drool or are unable to swallow liquids.  You vomit or are unable to keep medicines or liquids down.  You have severe pain that does not go away with the use of recommended medicines.  You have trouble breathing (not caused by a stuffy nose). MAKE SURE YOU:   Understand these instructions.  Will watch your condition.  Will get help right away if you are not doing well or get worse.   This information is not intended to replace advice given to you by your health care provider. Make sure you discuss any questions you have with your health care provider.   Document Released: 07/21/2005 Document Revised: 05/11/2013 Document Reviewed: 03/28/2013 Elsevier Interactive Patient Education [employer].

## 2015-08-07 NOTE — Progress Notes (Signed)
Patient ID: Weltha Horak, female   DOB: [birth year], 70 y.o.   MRN: [id number]    PCP: Lauree Chandler, NP  Advanced Directive information Does patient have an advance directive?: No, Would patient like information on creating an advanced directive?: No - patient declined information  No Known Allergies  Chief Complaint  Patient presents with  . Acute Visit    Patient c/o sore throat started about three days ago  . OTHER    Husband in room with patient     HPI: Patient is a 70 y.o. female seen in the office today due to sore throat. Pt with a pmh of DM, HTN, hyperlipemia. Has missed follow ups- said she has been out of town, got the phone calls. Reports they are staying away during the week with their grandchild and daughter, home on the weekends and mondays.  Has had sore throat for 3 days. No fevers or chills. No cough or congestion. No feelings of fatigue or malaise. Unable to sleep due to pain.  Has not taken anything yet.  Has hx of tonsillectomy     Review of Systems:  Review of Systems  Constitutional: Negative for activity change, appetite change, fatigue and unexpected weight change.  HENT: Positive for sore throat. Negative for congestion, hearing loss, postnasal drip, rhinorrhea and sinus pressure.   Eyes: Negative.   Respiratory: Negative for cough, choking and shortness of breath.   Cardiovascular: Negative for chest pain and palpitations.  Neurological: Negative for dizziness, weakness, light-headedness and headaches.    Past Medical History  Diagnosis Date  . Type II or unspecified type diabetes mellitus without mention of complication, not stated as uncontrolled   . Hyperlipidemia   . Hypertension   . Dizziness and giddiness   . Unspecified vitamin D deficiency   . Hypercalcemia   . Hypopotassemia   . Other specified disease of white blood cells   . Other malaise and fatigue   . Disorder of bone and cartilage, unspecified   . Special screening for  malignant neoplasms, colon    Past Surgical History  Procedure Laterality Date  . Tonsillectomy  1996  . Fracture surgery  2001    Tibia & fibula   Social History:   reports that she has never smoked. She does not have any smokeless tobacco history on file. She reports that she does not drink alcohol or use illicit drugs.  History reviewed. No pertinent family history.  Medications: Patient's Medications  New Prescriptions   No medications on file  Previous Medications   BENAZEPRIL-HYDROCHLORTHIAZIDE (LOTENSIN HCT) 20-12.5 MG TABLET    take 1 tablet by mouth once daily   CALCIUM-VITAMIN D (OSCAL) 250-125 MG-UNIT PER TABLET    Take 1 tablet by mouth 2 (two) times daily.   GLUCOSE BLOOD (FREESTYLE LITE) TEST STRIP    USE AS INSTRUCTED   GLUCOSE BLOOD TEST STRIP    Use as instructed. Test once daily to control blood sugar. 250.00   LANCETS (FREESTYLE) LANCETS    TEST as directed   LANCETS (ONETOUCH ULTRASOFT) LANCETS    Use as instructed   METFORMIN (GLUCOPHAGE) 1000 MG TABLET    take 1 tablet by mouth twice a day with food   OMEPRAZOLE (PRILOSEC) 40 MG CAPSULE    Take 40 mg by mouth daily.   PRAVASTATIN (PRAVACHOL) 80 MG TABLET    take 1 tablet by mouth once daily  Modified Medications   No medications on file  Discontinued Medications   BACITRACIN  OINTMENT    Apply 1 application topically 2 (two) times daily.   CEPHALEXIN (KEFLEX) 500 MG CAPSULE    Take 1 capsule (500 mg total) by mouth 4 (four) times daily.     Physical Exam:  Filed Vitals:   08/07/15 1446  BP: 128/82  Pulse: 71  Temp: 98 F (36.7 C)  TempSrc: Oral  Resp: 20  Height: 5\' 2"  (1.575 m)  Weight: 132 lb 9.6 oz (60.147 kg)  SpO2: 97%   Body mass index is 24.25 kg/(m^2).  Physical Exam  Constitutional: She is oriented to person, place, and time. She appears well-developed and well-nourished. No distress.  HENT:  Head: Normocephalic and atraumatic.  Right Ear: External ear normal.  Left Ear: External  ear normal.  Nose: Nose normal.  Mouth/Throat: Oropharynx is clear and moist. No oropharyngeal exudate.  Eyes: Conjunctivae and EOM are normal. Pupils are equal, round, and reactive to light.  Neck: Normal range of motion. Neck supple.  Cardiovascular: Normal rate, regular rhythm and normal heart sounds.   Pulmonary/Chest: Effort normal and breath sounds normal.  Neurological: She is alert and oriented to person, place, and time.  Skin: Skin is warm and dry. She is not diaphoretic.  Psychiatric: She has a normal mood and affect.    Labs reviewed: Basic Metabolic Panel:  Recent Labs  11/20/14 1342  NA 141  K 4.1  CL 101  CO2 24  GLUCOSE 83  BUN 18  CREATININE 0.64  CALCIUM 9.7   Liver Function Tests:  Recent Labs  11/20/14 1342  AST 17  ALT 21  ALKPHOS 79  BILITOT <0.2  PROT 7.1  ALBUMIN 4.6   No results for input(s): LIPASE, AMYLASE in the last 8760 hours. No results for input(s): AMMONIA in the last 8760 hours. CBC:  Recent Labs  11/20/14 1351  WBC 9.3  NEUTROABS 3.5  HGB 10.9*  HCT 32.0*  MCV 86  PLT 464*   Lipid Panel:  Recent Labs  11/20/14 1351  CHOL 171  HDL 60  LDLCALC 80  TRIG 156*  CHOLHDL 2.9   TSH: No results for input(s): TSH in the last 8760 hours. A1C: Lab Results  Component Value Date   HGBA1C 7.2* 11/20/2014     Assessment/Plan 1. Acute pharyngitis, unspecified etiology -sore throat for 3 days but otherwise feeling well. -To use tylenol 650 mg by mouth every 6 hours as needed sore throat -To drink warm drink with lemon and honey to help -May use chloraseptic spray as needed  -to follow up if fever or chills occur  2. Essential hypertension -blood pressure stable, conts on Lotensin-hctz -will follow up BMP with fasting blood work in AM  3. Type 2 diabetes mellitus without complication, without long-term current use of insulin (HCC) -conts on metformin 1000 mg BID - CBC with Differential; Future - Hemoglobin A1c;  Future  4. Hyperlipidemia -taking Pravachol 80 mg daily -will follow up fasting labs in AM - Comprehensive metabolic panel; Future - Lipid panel; Future  To follow up fasting blood work in AM and then make appt with Dr Mariea Clonts for routine follow up on the next Monday available.  Carlos American. Harle Battiest  [facility] [phone] 8 am - 5 pm) [phone] (after hours)

## 2015-08-08 ENCOUNTER — Other Ambulatory Visit: Payer: Medicare Other

## 2015-08-08 DIAGNOSIS — E785 Hyperlipidemia, unspecified: Secondary | ICD-10-CM | POA: Diagnosis not present

## 2015-08-08 DIAGNOSIS — E119 Type 2 diabetes mellitus without complications: Secondary | ICD-10-CM | POA: Diagnosis not present

## 2015-08-09 LAB — CBC WITH DIFFERENTIAL/PLATELET
BASOS ABS: 0.1 10*3/uL (ref 0.0–0.2)
BASOS: 1 %
EOS (ABSOLUTE): 0.1 10*3/uL (ref 0.0–0.4)
Eos: 2 %
HEMATOCRIT: 34.3 % (ref 34.0–46.6)
Hemoglobin: 11.6 g/dL (ref 11.1–15.9)
IMMATURE GRANULOCYTES: 0 %
Immature Grans (Abs): 0 10*3/uL (ref 0.0–0.1)
LYMPHS: 49 %
Lymphocytes Absolute: 4.4 10*3/uL — ABNORMAL HIGH (ref 0.7–3.1)
MCH: 29.1 pg (ref 26.6–33.0)
MCHC: 33.8 g/dL (ref 31.5–35.7)
MCV: 86 fL (ref 79–97)
Monocytes Absolute: 0.6 10*3/uL (ref 0.1–0.9)
Monocytes: 7 %
NEUTROS ABS: 3.5 10*3/uL (ref 1.4–7.0)
NEUTROS PCT: 41 %
PLATELETS: 454 10*3/uL — AB (ref 150–379)
RBC: 3.99 x10E6/uL (ref 3.77–5.28)
RDW: 13.4 % (ref 12.3–15.4)
WBC: 8.7 10*3/uL (ref 3.4–10.8)

## 2015-08-09 LAB — COMPREHENSIVE METABOLIC PANEL
ALT: 24 IU/L (ref 0–32)
AST: 14 IU/L (ref 0–40)
Albumin/Globulin Ratio: 1.5 (ref 1.1–2.5)
Albumin: 4.3 g/dL (ref 3.6–4.8)
Alkaline Phosphatase: 95 IU/L (ref 39–117)
BUN/Creatinine Ratio: 19 (ref 11–26)
BUN: 14 mg/dL (ref 8–27)
Bilirubin Total: 0.3 mg/dL (ref 0.0–1.2)
CO2: 27 mmol/L (ref 18–29)
Calcium: 9.5 mg/dL (ref 8.7–10.3)
Chloride: 100 mmol/L (ref 96–106)
Creatinine, Ser: 0.75 mg/dL (ref 0.57–1.00)
GFR calc Af Amer: 95 mL/min/{1.73_m2} (ref >59)
GFR calc non Af Amer: 83 mL/min/{1.73_m2} (ref >59)
GLOBULIN, TOTAL: 2.8 g/dL (ref 1.5–4.5)
Glucose: 128 mg/dL — ABNORMAL HIGH (ref 65–99)
Potassium: 4.2 mmol/L (ref 3.5–5.2)
Sodium: 140 mmol/L (ref 134–144)
Total Protein: 7.1 g/dL (ref 6.0–8.5)

## 2015-08-09 LAB — LIPID PANEL
Chol/HDL Ratio: 4 ratio units (ref 0.0–4.4)
Cholesterol, Total: 207 mg/dL — ABNORMAL HIGH (ref 100–199)
HDL: 52 mg/dL (ref >39)
LDL Calculated: 117 mg/dL — ABNORMAL HIGH (ref 0–99)
TRIGLYCERIDES: 191 mg/dL — AB (ref 0–149)
VLDL Cholesterol Cal: 38 mg/dL (ref 5–40)

## 2015-08-09 LAB — HEMOGLOBIN A1C
Est. average glucose Bld gHb Est-mCnc: 174 mg/dL
Hgb A1c MFr Bld: 7.7 % — ABNORMAL HIGH (ref 4.8–5.6)

## 2015-09-10 ENCOUNTER — Encounter: Payer: Self-pay | Admitting: Internal Medicine

## 2015-09-10 ENCOUNTER — Ambulatory Visit (INDEPENDENT_AMBULATORY_CARE_PROVIDER_SITE_OTHER): Payer: Medicare Other | Admitting: Internal Medicine

## 2015-09-10 VITALS — BP 124/82 | HR 64 | Temp 97.7°F | Ht 62.0 in | Wt 129.0 lb

## 2015-09-10 DIAGNOSIS — E119 Type 2 diabetes mellitus without complications: Secondary | ICD-10-CM

## 2015-09-10 DIAGNOSIS — R197 Diarrhea, unspecified: Secondary | ICD-10-CM

## 2015-09-10 DIAGNOSIS — I10 Essential (primary) hypertension: Secondary | ICD-10-CM

## 2015-09-10 DIAGNOSIS — Z1231 Encounter for screening mammogram for malignant neoplasm of breast: Secondary | ICD-10-CM | POA: Diagnosis not present

## 2015-09-10 DIAGNOSIS — E559 Vitamin D deficiency, unspecified: Secondary | ICD-10-CM

## 2015-09-10 DIAGNOSIS — Z780 Asymptomatic menopausal state: Secondary | ICD-10-CM

## 2015-09-10 DIAGNOSIS — E785 Hyperlipidemia, unspecified: Secondary | ICD-10-CM | POA: Diagnosis not present

## 2015-09-10 NOTE — Patient Instructions (Signed)
Diabetic tussin cough syrup if you develop a cough again.  Please take your metformin 2x per day WITH FOOD and monitor for diarrhea.    Also, take your pravachol EVERY night.

## 2015-09-10 NOTE — Progress Notes (Signed)
Patient ID: Joann Kennedy, female   DOB: Jan 12, 1946, 70 y.o.   MRN: [id number]   Location: [facility] Provider: Rexene Edison. Mariea Clonts, D.O., C.M.D.  Goals of Care: Advanced Directive information Does patient have an advance directive?: No  Chief Complaint  Patient presents with  . Medical Management of Chronic Issues    blood pressure, blood sugar. BS this am at home 123. Checks BS once a week, if it is high.  . Diarrhea    off and on. Has had colonscopy by Dr. Collene Mares, couldn't find anything.  Not taking Prilosec because she doesn't feel it helps. Took Pepto Bismol for diarrhea one time.    HPI: Patient is a 70 y.o. female seen in the office today for her first ever visit with me.  She was following with NP Dewaine Oats, but pt can only come on mondays due to their weekend travel.    HTN:  bp at goal this am.   DMII:  Last hba1c was 7.7 last month.  Reports she was eating differently recently.  Does spend time with her grandson all day long chasing him around.  More than 6-7 years ago diagnosis.  Does occasionally miss her metformin, but usually remembers it.  Weekly checks her glucose.   Hyperlipidemia:  Also trended up last month.  Takes pravachol at night--does take most nights.    Had cscope and endoscope done in Oct by Dr. Llana Aliment scattered diverticular disease and high fiber diet was recommended.  Did not get benefit from prilosec.  Had gone to see Dr. Collene Mares due to diarrhea.  Was having watery stools every 2 hrs.  Then took pepto bismol a couple of times--helped for that episode.  Says she has only taken the morning metformin with milk tea.  Not food.    Has not had recent mammo or bone density.    She had a broken leg in 2002.  Has not had a bone density for years.  Has vitamin D deficiency:  On vitamin D.  Review of Systems:  Review of Systems  Constitutional: Negative for fever and chills.  HENT: Negative for hearing loss.   Eyes: Negative for blurred vision.    Respiratory: Negative for shortness of breath.   Cardiovascular: Negative for chest pain and leg swelling.  Gastrointestinal: Positive for abdominal pain and diarrhea. Negative for heartburn, nausea, vomiting, constipation, blood in stool and melena.  Genitourinary: Negative for dysuria.  Musculoskeletal: Negative for falls.  Skin: Negative for rash.  Neurological: Negative for dizziness.  Psychiatric/Behavioral: Negative for memory loss.    Past Medical History  Diagnosis Date  . Type II or unspecified type diabetes mellitus without mention of complication, not stated as uncontrolled   . Hyperlipidemia   . Hypertension   . Dizziness and giddiness   . Unspecified vitamin D deficiency   . Hypercalcemia   . Hypopotassemia   . Other specified disease of white blood cells   . Other malaise and fatigue   . Disorder of bone and cartilage, unspecified   . Special screening for malignant neoplasms, colon     Past Surgical History  Procedure Laterality Date  . Tonsillectomy  1996  . Fracture surgery  2001    Tibia & fibula  . Colonoscopy  05/14/15    Dr. Collene Mares    No Known Allergies    Medication List       This list is accurate as of: 09/10/15  8:50 AM.  Always use your most recent med list.  benazepril-hydrochlorthiazide 20-12.5 MG tablet  Commonly known as:  LOTENSIN HCT  take 1 tablet by mouth once daily     calcium-vitamin D 250-125 MG-UNIT tablet  Commonly known as:  OSCAL  Take 1 tablet by mouth 2 (two) times daily.     glucose blood test strip  Use as instructed. Test once daily to control blood sugar. 250.00     glucose blood test strip  Commonly known as:  FREESTYLE LITE  USE AS INSTRUCTED     metFORMIN 1000 MG tablet  Commonly known as:  GLUCOPHAGE  take 1 tablet by mouth twice a day with food     omeprazole 40 MG capsule  Commonly known as:  PRILOSEC  Take 40 mg by mouth daily. Reported on 09/10/2015     onetouch ultrasoft lancets  Use  as instructed     freestyle lancets  TEST as directed     pravastatin 80 MG tablet  Commonly known as:  PRAVACHOL  take 1 tablet by mouth once daily        Health Maintenance  Topic Date Due  . Hepatitis C Screening  01/12/1946  . OPHTHALMOLOGY EXAM  11/02/1957  . ZOSTAVAX  11/03/2007  . PNA vac Low Risk Adult (1 of 2 - PCV13) 11/02/2012  . INFLUENZA VACCINE  03/05/2015  . FOOT EXAM  05/18/2015  . MAMMOGRAM  04/08/2016 (Originally 04/09/2014)  . HEMOGLOBIN A1C  02/05/2016  . TETANUS/TDAP  12/31/2024  . COLONOSCOPY  05/13/2025  . DEXA SCAN  Completed    Physical Exam: Filed Vitals:   09/10/15 0844  BP: 124/82  Pulse: 64  Temp: 97.7 F (36.5 C)  TempSrc: Oral  Height: 5\' 2"  (1.575 m)  Weight: 129 lb (58.514 kg)  SpO2: 97%   Body mass index is 23.59 kg/(m^2). Physical Exam  Constitutional: She is oriented to person, place, and time. She appears well-developed and well-nourished.  Cardiovascular: Normal rate, regular rhythm and normal heart sounds.   Pulmonary/Chest: Effort normal and breath sounds normal. No respiratory distress.  Abdominal: Soft. Bowel sounds are normal. She exhibits no distension and no mass. There is no tenderness.  Musculoskeletal: Normal range of motion. She exhibits no tenderness.  Neurological: She is alert and oriented to person, place, and time.  Skin: Skin is warm and dry.  Psychiatric: She has a normal mood and affect.    Labs reviewed: Basic Metabolic Panel:  Recent Labs  11/20/14 1342 08/08/15 1003  NA 141 140  K 4.1 4.2  CL 101 100  CO2 24 27  GLUCOSE 83 128*  BUN 18 14  CREATININE 0.64 0.75  CALCIUM 9.7 9.5   Liver Function Tests:  Recent Labs  11/20/14 1342 08/08/15 1003  AST 17 14  ALT 21 24  ALKPHOS 79 95  BILITOT <0.2 0.3  PROT 7.1 7.1  ALBUMIN 4.6 4.3   No results for input(s): LIPASE, AMYLASE in the last 8760 hours. No results for input(s): AMMONIA in the last 8760 hours. CBC:  Recent Labs   11/20/14 1351 08/08/15 1003  WBC 9.3 8.7  NEUTROABS 3.5 3.5  HGB 10.9*  --   HCT 32.0* 34.3  MCV 86 86  PLT 464* 454*   Lipid Panel:  Recent Labs  11/20/14 1351 08/08/15 1003  CHOL 171 207*  HDL 60 52  LDLCALC 80 117*  TRIG 156* 191*  CHOLHDL 2.9 4.0   Lab Results  Component Value Date   HGBA1C 7.7* 08/08/2015   Assessment/Plan 1. Diarrhea, unspecified  type - I suspect this is metformin induced--she's only been taking the metformin with milk tea not with breakfast and supper--explained it must be taken with food -happens suddenly after meals and associated with lower abdominal pain only until bm occurs -she cannot go out at times b/c of this - Comprehensive metabolic panel; Future  2. Essential hypertension -bp well controlled, is on ace, cont same meds  3. Type 2 diabetes mellitus without complication, without long-term current use of insulin (HCC) - cont to use metformin but take faithfully with meals twice a day (has missed doses and not taking with food) -try to reduce portion sizes of starchy foods - Hemoglobin A1c; Future -last hba1c was 7.7 which is too high given how overall healthy she is -also encouraged deliberate exercise program  4. Hyperlipidemia -LDL above goal and has trended upward along with TGs -counseled on starchy intake as above -must faithfully take pravachol in evenings - Lipid panel; Future  5. Vitamin D deficiency - cont  Calcium with Vitamin D - DG Bone Density; Future  6. Postmenopausal estrogen deficiency - bone density ordered - DG Bone Density; Future  7. Encounter for screening mammogram for breast cancer - MM DIGITAL SCREENING BILATERAL; Future at [facility] -this is overdue  Labs/tests ordered:   Orders Placed This Encounter  Procedures  . MM DIGITAL SCREENING BILATERAL    Standing Status: Future     Number of Occurrences:      Standing Expiration Date: 11/09/2016    Scheduling Instructions:     Must be arranged  with pt's daughter on an every other Monday schedule    Order Specific Question:  Reason for exam:    Answer:  routine screening mammogram (last 2013)    Order Specific Question:  Preferred imaging location?    Answer:  [facility]  . DG Bone Density    Standing Status: Future     Number of Occurrences:      Standing Expiration Date: 11/09/2016    Scheduling Instructions:     Needs to be arranged with her daughter's schedule every other Monday and same day as mammogram    Order Specific Question:  Reason for Exam (SYMPTOM  OR DIAGNOSIS REQUIRED)    Answer:  vitamin D deficiency, prior fracture    Order Specific Question:  Preferred imaging location?    Answer:  [facility]  . Hemoglobin A1c    Standing Status: Future     Number of Occurrences:      Standing Expiration Date: 03/09/2016  . Lipid panel    Standing Status: Future     Number of Occurrences:      Standing Expiration Date: 03/09/2016    Order Specific Question:  Has the patient fasted?    Answer:  Yes  . Comprehensive metabolic panel    Standing Status: Future     Number of Occurrences:      Standing Expiration Date: 03/09/2016    Order Specific Question:  Has the patient fasted?    Answer:  Yes    Next appt:  Prn if diarrhea not getting better; 3 mos with labs before for med mgt  Naureen Benton L. Zamiyah Resendes, D.O. [facility] [address], [place] [address] Cell Phone (Mon-Fri 8am-5pm):  [phone] On Call:  [phone] & follow prompts after 5pm & weekends Office Phone:  [phone] Office Fax:  [phone]

## 2015-09-19 ENCOUNTER — Telehealth: Payer: Self-pay | Admitting: Internal Medicine

## 2015-09-19 NOTE — Telephone Encounter (Signed)
Sending letter to patient regarding overdue mammogram..[facility]

## 2015-10-12 ENCOUNTER — Other Ambulatory Visit: Payer: Medicare Other

## 2015-10-12 ENCOUNTER — Other Ambulatory Visit: Payer: Self-pay

## 2015-10-12 DIAGNOSIS — K589 Irritable bowel syndrome without diarrhea: Secondary | ICD-10-CM

## 2015-10-12 DIAGNOSIS — Z1211 Encounter for screening for malignant neoplasm of colon: Secondary | ICD-10-CM

## 2015-10-13 LAB — FECAL OCCULT BLOOD, IMMUNOCHEMICAL: Fecal Occult Bld: NEGATIVE

## 2015-10-22 ENCOUNTER — Ambulatory Visit
Admission: RE | Admit: 2015-10-22 | Discharge: 2015-10-22 | Disposition: A | Discharge status: Home / Self Care | Payer: Medicare Other | Source: Ambulatory Visit | Attending: Internal Medicine | Admitting: Internal Medicine

## 2015-10-22 DIAGNOSIS — Z1231 Encounter for screening mammogram for malignant neoplasm of breast: Secondary | ICD-10-CM

## 2015-10-22 DIAGNOSIS — Z780 Asymptomatic menopausal state: Secondary | ICD-10-CM

## 2015-10-22 DIAGNOSIS — E559 Vitamin D deficiency, unspecified: Secondary | ICD-10-CM

## 2015-10-22 DIAGNOSIS — M8589 Other specified disorders of bone density and structure, multiple sites: Secondary | ICD-10-CM | POA: Diagnosis not present

## 2015-12-05 ENCOUNTER — Other Ambulatory Visit: Payer: Medicare Other

## 2015-12-10 ENCOUNTER — Ambulatory Visit: Payer: Medicare Other | Admitting: Internal Medicine

## 2016-01-02 ENCOUNTER — Other Ambulatory Visit: Payer: Medicare Other

## 2016-01-04 ENCOUNTER — Ambulatory Visit: Payer: Medicare Other | Admitting: Internal Medicine

## 2016-01-07 ENCOUNTER — Other Ambulatory Visit: Payer: Medicare Other

## 2016-01-07 DIAGNOSIS — E119 Type 2 diabetes mellitus without complications: Secondary | ICD-10-CM | POA: Diagnosis not present

## 2016-01-07 DIAGNOSIS — R197 Diarrhea, unspecified: Secondary | ICD-10-CM

## 2016-01-07 DIAGNOSIS — E785 Hyperlipidemia, unspecified: Secondary | ICD-10-CM

## 2016-01-08 DIAGNOSIS — H40033 Anatomical narrow angle, bilateral: Secondary | ICD-10-CM | POA: Diagnosis not present

## 2016-01-08 LAB — LIPID PANEL
Chol/HDL Ratio: 2.9 ratio units (ref 0.0–4.4)
Cholesterol, Total: 166 mg/dL (ref 100–199)
HDL: 57 mg/dL (ref >39)
LDL Calculated: 81 mg/dL (ref 0–99)
Triglycerides: 138 mg/dL (ref 0–149)
VLDL Cholesterol Cal: 28 mg/dL (ref 5–40)

## 2016-01-08 LAB — COMPREHENSIVE METABOLIC PANEL
ALT: 19 IU/L (ref 0–32)
AST: 12 IU/L (ref 0–40)
Albumin/Globulin Ratio: 1.6 (ref 1.2–2.2)
Albumin: 4.3 g/dL (ref 3.6–4.8)
Alkaline Phosphatase: 81 IU/L (ref 39–117)
BUN/Creatinine Ratio: 20 (ref 12–28)
BUN: 17 mg/dL (ref 8–27)
Bilirubin Total: 0.2 mg/dL (ref 0.0–1.2)
CO2: 22 mmol/L (ref 18–29)
Calcium: 9.5 mg/dL (ref 8.7–10.3)
Chloride: 100 mmol/L (ref 96–106)
Creatinine, Ser: 0.84 mg/dL (ref 0.57–1.00)
GFR calc Af Amer: 83 mL/min/{1.73_m2} (ref >59)
GFR calc non Af Amer: 72 mL/min/{1.73_m2} (ref >59)
Globulin, Total: 2.7 g/dL (ref 1.5–4.5)
Glucose: 141 mg/dL — ABNORMAL HIGH (ref 65–99)
Potassium: 4.3 mmol/L (ref 3.5–5.2)
Sodium: 140 mmol/L (ref 134–144)
Total Protein: 7 g/dL (ref 6.0–8.5)

## 2016-01-08 LAB — HEMOGLOBIN A1C
Est. average glucose Bld gHb Est-mCnc: 200 mg/dL
Hgb A1c MFr Bld: 8.6 % — ABNORMAL HIGH (ref 4.8–5.6)

## 2016-01-10 ENCOUNTER — Encounter: Payer: Self-pay | Admitting: *Deleted

## 2016-01-14 ENCOUNTER — Encounter: Payer: Self-pay | Admitting: Internal Medicine

## 2016-01-14 ENCOUNTER — Ambulatory Visit (INDEPENDENT_AMBULATORY_CARE_PROVIDER_SITE_OTHER): Payer: Medicare Other | Admitting: Internal Medicine

## 2016-01-14 VITALS — BP 122/70 | HR 79 | Temp 98.1°F | Wt 131.0 lb

## 2016-01-14 DIAGNOSIS — E785 Hyperlipidemia, unspecified: Secondary | ICD-10-CM | POA: Diagnosis not present

## 2016-01-14 DIAGNOSIS — I10 Essential (primary) hypertension: Secondary | ICD-10-CM

## 2016-01-14 DIAGNOSIS — R197 Diarrhea, unspecified: Secondary | ICD-10-CM | POA: Diagnosis not present

## 2016-01-14 DIAGNOSIS — E119 Type 2 diabetes mellitus without complications: Secondary | ICD-10-CM | POA: Diagnosis not present

## 2016-01-14 DIAGNOSIS — E782 Mixed hyperlipidemia: Secondary | ICD-10-CM | POA: Insufficient documentation

## 2016-01-14 MED ORDER — EMPAGLIFLOZIN 10 MG PO TABS: 10.0000 mg | ORAL_TABLET | Freq: Every day | ORAL | Status: DC

## 2016-01-14 NOTE — Progress Notes (Signed)
Location:  [facility] clinic Provider:  Celina Shiley L. Mariea Clonts, D.O., C.M.D.  Code Status: DNR Goals of Care:  Advanced Directives 01/14/2016  Does patient have an advance directive? No  Would patient like information on creating an advanced directive? -   Chief Complaint  Patient presents with  . Acute Visit    abdominal pain, discuss lab results    HPI: Patient is a 70 y.o. female seen today for an med mgt chronic diseases.     Diarrhea:  A little bit less when taking the metformin with food.  Still happening --off and on.  Wants to switch off metformin.  She had parties for a week before her hba1c--had been 187 or so.  Yesterday and today much better 120s.  Has cut her rice down to once a day.  Eats her meals at different times.  Did go for eye exam--saw Dr. Sharlotte Alamo in [place] a little bit of cataracts, no retinopathy.    Past Medical History  Diagnosis Date  . Type II or unspecified type diabetes mellitus without mention of complication, not stated as uncontrolled   . Hyperlipidemia   . Hypertension   . Dizziness and giddiness   . Unspecified vitamin D deficiency   . Hypercalcemia   . Hypopotassemia   . Other specified disease of white blood cells   . Other malaise and fatigue   . Disorder of bone and cartilage, unspecified   . Special screening for malignant neoplasms, colon     Past Surgical History  Procedure Laterality Date  . Tonsillectomy  1996  . Fracture surgery  2001    Tibia & fibula  . Colonoscopy  05/14/15    Dr. Collene Mares    No Known Allergies    Medication List       This list is accurate as of: 01/14/16  4:11 PM.  Always use your most recent med list.               benazepril-hydrochlorthiazide 20-12.5 MG tablet  Commonly known as:  LOTENSIN HCT  take 1 tablet by mouth once daily     calcium-vitamin D 250-125 MG-UNIT tablet  Commonly known as:  OSCAL  Take 1 tablet by mouth 2 (two) times daily.     glucose blood test strip  Use as  instructed. Test once daily to control blood sugar. 250.00     glucose blood test strip  Commonly known as:  FREESTYLE LITE  USE AS INSTRUCTED     metFORMIN 1000 MG tablet  Commonly known as:  GLUCOPHAGE  take 1 tablet by mouth twice a day with food     omeprazole 40 MG capsule  Commonly known as:  PRILOSEC  Take 40 mg by mouth daily. Reported on 09/10/2015     onetouch ultrasoft lancets  Use as instructed     freestyle lancets  TEST as directed     pravastatin 80 MG tablet  Commonly known as:  PRAVACHOL  take 1 tablet by mouth once daily        Review of Systems:  Review of Systems  Constitutional: Negative for fever and chills.  HENT: Negative for congestion.   Eyes: Negative for blurred vision.       Glasses, just had eye exam done by her daughter  Respiratory: Negative for shortness of breath.   Cardiovascular: Negative for chest pain and palpitations.  Gastrointestinal: Positive for heartburn and diarrhea. Negative for nausea, vomiting, abdominal pain, constipation, blood in stool and melena.  Genitourinary: Negative for dysuria.  Musculoskeletal: Negative for falls.  Skin: Negative for rash.  Neurological: Negative for dizziness and loss of consciousness.  Psychiatric/Behavioral: Negative for depression and memory loss.    Health Maintenance  Topic Date Due  . Hepatitis C Screening  08-16-1945  . OPHTHALMOLOGY EXAM  11/02/1957  . ZOSTAVAX  11/03/2007  . PNA vac Low Risk Adult (1 of 2 - PCV13) 11/02/2012  . FOOT EXAM  05/18/2015  . INFLUENZA VACCINE  03/04/2016  . HEMOGLOBIN A1C  07/08/2016  . MAMMOGRAM  10/21/2017  . TETANUS/TDAP  12/31/2024  . COLONOSCOPY  05/13/2025  . DEXA SCAN  Completed    Physical Exam: Filed Vitals:   01/14/16 1541  BP: 122/70  Pulse: 79  Temp: 98.1 F (36.7 C)  TempSrc: Oral  Weight: 131 lb (59.421 kg)  SpO2: 96%   Body mass index is 23.95 kg/(m^2). Physical Exam  Constitutional: She is oriented to person, place, and  time. She appears well-developed and well-nourished. No distress.  Eyes:  glasses  Cardiovascular: Normal rate, regular rhythm, normal heart sounds and intact distal pulses.   Pulmonary/Chest: Effort normal and breath sounds normal. No respiratory distress.  Abdominal: Soft. Bowel sounds are normal. She exhibits no distension and no mass. There is no tenderness.  Musculoskeletal: Normal range of motion.  Neurological: She is alert and oriented to person, place, and time.  Skin: Skin is warm and dry.  Psychiatric: She has a normal mood and affect.    Labs reviewed: Basic Metabolic Panel:  Recent Labs  08/08/15 1003 01/07/16 0854  NA 140 140  K 4.2 4.3  CL 100 100  CO2 27 22  GLUCOSE 128* 141*  BUN 14 17  CREATININE 0.75 0.84  CALCIUM 9.5 9.5   Liver Function Tests:  Recent Labs  08/08/15 1003 01/07/16 0854  AST 14 12  ALT 24 19  ALKPHOS 95 81  BILITOT 0.3 0.2  PROT 7.1 7.0  ALBUMIN 4.3 4.3   No results for input(s): LIPASE, AMYLASE in the last 8760 hours. No results for input(s): AMMONIA in the last 8760 hours. CBC:  Recent Labs  08/08/15 1003  WBC 8.7  NEUTROABS 3.5  HCT 34.3  MCV 86  PLT 454*   Lipid Panel:  Recent Labs  08/08/15 1003 01/07/16 0854  CHOL 207* 166  HDL 52 57  LDLCALC 117* 81  TRIG 191* 138  CHOLHDL 4.0 2.9   Lab Results  Component Value Date   HGBA1C 8.6* 01/07/2016   Assessment/Plan 1. Type 2 diabetes mellitus without complication, without long-term current use of insulin (HCC) - d/c metformin due to persistent diarrhea - empagliflozin (JARDIANCE) 10 MG TABS tablet; Take 10 mg by mouth daily.  Dispense: 14 tablet; Refill: 0  2. Essential hypertension -bp at goal with ace/hctz  3. Hyperlipidemia -cont pravachol--cholesterol is better  4. Diarrhea, unspecified type -seems related to metformin even when she eats with it so will d/c metformin and try jardiance  Labs/tests ordered:  No orders of the defined types were  placed in this encounter.   Next appt:  2 wks f/u on sugars   Kylar Speelman L. Dartha Rozzell, D.O. [facility] [address], [place] [address] Cell Phone (Mon-Fri 8am-5pm):  [phone] On Call:  [phone] & follow prompts after 5pm & weekends Office Phone:  [phone] Office Fax:  [phone]

## 2016-01-14 NOTE — Patient Instructions (Addendum)
Please get [place] the report from your eye exam.    Begin jardiance samples:   Jardiance 10mg  one tablet by mouth daily in the morning with or without food. Continue to check your sugars and record them for me.  I will review them with you at your follow up visit and determine if we need to go up to 25mg  daily.

## 2016-01-31 ENCOUNTER — Ambulatory Visit: Payer: Medicare Other | Admitting: Internal Medicine

## 2016-02-07 ENCOUNTER — Ambulatory Visit (INDEPENDENT_AMBULATORY_CARE_PROVIDER_SITE_OTHER): Payer: Medicare Other | Admitting: Internal Medicine

## 2016-02-07 ENCOUNTER — Encounter: Payer: Self-pay | Admitting: Internal Medicine

## 2016-02-07 VITALS — BP 130/80 | HR 74 | Temp 97.7°F | Ht 62.0 in | Wt 128.8 lb

## 2016-02-07 DIAGNOSIS — E119 Type 2 diabetes mellitus without complications: Secondary | ICD-10-CM

## 2016-02-07 DIAGNOSIS — R195 Other fecal abnormalities: Secondary | ICD-10-CM

## 2016-02-07 MED ORDER — EMPAGLIFLOZIN 25 MG PO TABS: 25.0000 mg | ORAL_TABLET | Freq: Every day | ORAL | Status: DC

## 2016-02-07 NOTE — Progress Notes (Signed)
Location:  [facility] clinic Provider:  Ishika Chesterfield L. Mariea Clonts, D.O., C.M.D.  Code Status: full code Goals of Care:  Advanced Directives 02/07/2016  Does patient have an advance directive? No  Would patient like information on creating an advanced directive? -    Chief Complaint  Patient presents with  . Medical Management of Chronic Issues    2 week follow up on sugar, talk about new medication she is taking    HPI: Patient is a 70 y.o. female seen today for medical management of chronic diseases.    Had gotten samples of jardiance 10mg  daily for a week.  Says she wrote down her sugars each am--ranging 140-150.  Called back twice.  Went back to her metformin and sugar has been 120.  Bowel problems returned when restarted the metformin.     Past Medical History  Diagnosis Date  . Type II or unspecified type diabetes mellitus without mention of complication, not stated as uncontrolled   . Hyperlipidemia   . Hypertension   . Dizziness and giddiness   . Unspecified vitamin D deficiency   . Hypercalcemia   . Hypopotassemia   . Other specified disease of white blood cells   . Other malaise and fatigue   . Disorder of bone and cartilage, unspecified   . Special screening for malignant neoplasms, colon     Past Surgical History  Procedure Laterality Date  . Tonsillectomy  1996  . Fracture surgery  2001    Tibia & fibula  . Colonoscopy  05/14/15    Dr. Collene Mares    No Known Allergies    Medication List       This list is accurate as of: 02/07/16 11:38 AM.  Always use your most recent med list.               benazepril-hydrochlorthiazide 20-12.5 MG tablet  Commonly known as:  LOTENSIN HCT  take 1 tablet by mouth once daily     calcium-vitamin D 250-125 MG-UNIT tablet  Commonly known as:  OSCAL  Take 1 tablet by mouth 2 (two) times daily.     empagliflozin 10 MG Tabs tablet  Commonly known as:  JARDIANCE  Take 10 mg by mouth daily.     freestyle lancets  TEST as directed       glucose blood test strip  Use as instructed. Test once daily to control blood sugar. 250.00     glucose blood test strip  Commonly known as:  FREESTYLE LITE  USE AS INSTRUCTED     omeprazole 40 MG capsule  Commonly known as:  PRILOSEC  Take 40 mg by mouth daily. Reported on 09/10/2015     pravastatin 80 MG tablet  Commonly known as:  PRAVACHOL  take 1 tablet by mouth once daily        Review of Systems:  Review of Systems  Constitutional: Negative for fever, chills and malaise/fatigue.  HENT: Negative for congestion and hearing loss.   Eyes: Negative for blurred vision.  Respiratory: Negative for shortness of breath.   Cardiovascular: Negative for chest pain.  Gastrointestinal: Positive for diarrhea.  Genitourinary: Negative for dysuria, urgency and frequency.  Musculoskeletal: Negative for falls.  Skin: Negative for rash.  Neurological: Negative for dizziness and weakness.  Psychiatric/Behavioral: Negative for memory loss.    Health Maintenance  Topic Date Due  . Hepatitis C Screening  [birth year]  . OPHTHALMOLOGY EXAM  11/02/1957  . ZOSTAVAX  11/03/2007  . PNA vac Low Risk Adult (  1 of 2 - PCV13) 11/02/2012  . INFLUENZA VACCINE  03/04/2016  . HEMOGLOBIN A1C  07/08/2016  . FOOT EXAM  01/13/2017  . MAMMOGRAM  10/21/2017  . TETANUS/TDAP  12/31/2024  . COLONOSCOPY  05/13/2025  . DEXA SCAN  Completed    Physical Exam: Filed Vitals:   02/07/16 1105  BP: 130/80  Pulse: 74  Temp: 97.7 F (36.5 C)  Height: 5\' 2"  (1.575 m)  Weight: 128 lb 12.8 oz (58.423 kg)  SpO2: 97%   Body mass index is 23.55 kg/(m^2). Physical Exam  Constitutional: She is oriented to person, place, and time. She appears well-developed and well-nourished. No distress.  Cardiovascular: Normal rate, regular rhythm, normal heart sounds and intact distal pulses.   Pulmonary/Chest: Effort normal and breath sounds normal. No respiratory distress.  Abdominal: Bowel sounds are normal.   Musculoskeletal: Normal range of motion.  Neurological: She is alert and oriented to person, place, and time.  Skin: Skin is warm and dry.  Psychiatric: She has a normal mood and affect.    Labs reviewed: Basic Metabolic Panel:  Recent Labs  08/08/15 1003 01/07/16 0854  NA 140 140  K 4.2 4.3  CL 100 100  CO2 27 22  GLUCOSE 128* 141*  BUN 14 17  CREATININE 0.75 0.84  CALCIUM 9.5 9.5   Liver Function Tests:  Recent Labs  08/08/15 1003 01/07/16 0854  AST 14 12  ALT 24 19  ALKPHOS 95 81  BILITOT 0.3 0.2  PROT 7.1 7.0  ALBUMIN 4.3 4.3   No results for input(s): LIPASE, AMYLASE in the last 8760 hours. No results for input(s): AMMONIA in the last 8760 hours. CBC:  Recent Labs  08/08/15 1003  WBC 8.7  NEUTROABS 3.5  HCT 34.3  MCV 86  PLT 454*   Lipid Panel:  Recent Labs  08/08/15 1003 01/07/16 0854  CHOL 207* 166  HDL 52 57  LDLCALC 117* 81  TRIG 191* 138  CHOLHDL 4.0 2.9   Lab Results  Component Value Date   HGBA1C 8.6* 01/07/2016     Assessment/Plan 1. Type 2 diabetes mellitus without complication, without long-term current use of insulin (HCC) - stop metformin again - begin sample of increased dose of jardiance and keep record of sugars and of loose stools and bring both to appt in 1 month so we can determine for sure if metformin was causing diarrhea even when taken with food - empagliflozin (JARDIANCE) 25 MG TABS tablet; Take 25 mg by mouth daily.  Dispense: 35 tablet; Refill: 0 -in the meantime, if she calls in and notes improved sugars and resolution of loose stools, prescription for jardiance 25mg  can be called into pharmacy -if not better, I will need to add another medication  2. Loose stools -seems it's metformin induced -was only off for one week and on jardiance--sugars not better, but loose stools may have been--(she should have had two weeks of tx b/c I gave her 82, but I think she took it twice a day like her metformin) -if not  better off metformin, will send to GI for further work up  Labs/tests ordered:  No orders of the defined types were placed in this encounter.   Next appt:  03/14/2016  Kiana Hollar L. Neill Jurewicz, D.O. [facility] [address], [place] [address] Cell Phone (Mon-Fri 8am-5pm):  [phone] On Call:  [phone] & follow prompts after 5pm & weekends Office Phone:  [phone] Office Fax:  [phone]

## 2016-02-07 NOTE — Patient Instructions (Signed)
Stop metformin again.  Begin jardiance 25mg  daily.   Continue this and check your sugars daily. Please return in 1 month and we will look at them. Please write down your loose bms. Bring these records with you next time.

## 2016-03-02 DIAGNOSIS — J029 Acute pharyngitis, unspecified: Secondary | ICD-10-CM | POA: Diagnosis not present

## 2016-03-14 ENCOUNTER — Ambulatory Visit: Payer: Medicare Other | Admitting: Internal Medicine

## 2016-03-17 ENCOUNTER — Ambulatory Visit (INDEPENDENT_AMBULATORY_CARE_PROVIDER_SITE_OTHER): Payer: Medicare Other | Admitting: Internal Medicine

## 2016-03-17 ENCOUNTER — Encounter: Payer: Self-pay | Admitting: Internal Medicine

## 2016-03-17 VITALS — BP 120/70 | HR 64 | Temp 98.2°F | Ht 62.0 in | Wt 123.0 lb

## 2016-03-17 DIAGNOSIS — E119 Type 2 diabetes mellitus without complications: Secondary | ICD-10-CM

## 2016-03-17 DIAGNOSIS — R195 Other fecal abnormalities: Secondary | ICD-10-CM | POA: Diagnosis not present

## 2016-03-17 DIAGNOSIS — E785 Hyperlipidemia, unspecified: Secondary | ICD-10-CM

## 2016-03-17 DIAGNOSIS — I10 Essential (primary) hypertension: Secondary | ICD-10-CM

## 2016-03-17 MED ORDER — LINAGLIPTIN 5 MG PO TABS: 5.0000 mg | ORAL_TABLET | Freq: Every day | ORAL | 3 refills | Status: DC

## 2016-03-17 MED ORDER — EMPAGLIFLOZIN 25 MG PO TABS: 25.0000 mg | ORAL_TABLET | Freq: Every day | ORAL | 3 refills | Status: DC

## 2016-03-17 MED ORDER — GLUCOSE BLOOD VI STRP: ORAL_STRIP | 12 refills | Status: DC

## 2016-03-17 NOTE — Patient Instructions (Signed)
We sent in a prescription for your test strips. Let's try tradjenta 5mg  daily at your largest meal and continue with the jardiance 25mg  daily. Continue to check your fasting sugars.   Return in 1 month for your labs.

## 2016-03-17 NOTE — Progress Notes (Signed)
Location:  [facility] clinic Provider:  Halsey Persaud L. Mariea Clonts, D.O., C.M.D.  Code Status: full code Goals of Care:  Advanced Directives 03/17/2016  Does patient have an advance directive? No  Would patient like information on creating an advanced directive? No - patient declined information   Chief Complaint  Patient presents with  . Medical Management of Chronic Issues    1 mth follow-up, diabetes    HPI: Patient is a 70 y.o. female seen today for medical management of chronic diseases.  F/u on her diabetes and diarrhea.  BMs are improved.  Sugars are ok.  Had a bad flu and had to take dayquil and nyquil and had to go to urgent care for amoxicillin for 10 days.  Finished that last Tuesday.  130/128 fasting.    Has lost 6 lbs.  Is trying to do better with her diet.    Counseled on portion sizes and given handout today.  Past Medical History:  Diagnosis Date  . Disorder of bone and cartilage, unspecified   . Dizziness and giddiness   . Hypercalcemia   . Hyperlipidemia   . Hypertension   . Hypopotassemia   . Other malaise and fatigue   . Other specified disease of white blood cells   . Special screening for malignant neoplasms, colon   . Type II or unspecified type diabetes mellitus without mention of complication, not stated as uncontrolled   . Unspecified vitamin D deficiency     Past Surgical History:  Procedure Laterality Date  . COLONOSCOPY  05/14/15   Dr. Collene Mares  . FRACTURE SURGERY  2001   Tibia & fibula  . TONSILLECTOMY  1996    No Known Allergies    Medication List       Accurate as of 03/17/16 10:45 AM. Always use your most recent med list.          benazepril-hydrochlorthiazide 20-12.5 MG tablet Commonly known as:  LOTENSIN HCT take 1 tablet by mouth once daily   calcium-vitamin D 250-125 MG-UNIT tablet Commonly known as:  OSCAL Take 1 tablet by mouth 2 (two) times daily.   empagliflozin 25 MG Tabs tablet Commonly known as:  JARDIANCE Take 25 mg by mouth  daily.   freestyle lancets TEST as directed   glucose blood test strip Commonly known as:  FREESTYLE LITE USE AS INSTRUCTED   omeprazole 40 MG capsule Commonly known as:  PRILOSEC Take 40 mg by mouth daily. Reported on 09/10/2015   pravastatin 80 MG tablet Commonly known as:  PRAVACHOL take 1 tablet by mouth once daily       Review of Systems:  Review of Systems  Constitutional: Negative for chills, fever and malaise/fatigue.  HENT: Negative for hearing loss.   Eyes: Negative for blurred vision.  Respiratory: Negative for shortness of breath.   Cardiovascular: Negative for chest pain and palpitations.  Gastrointestinal: Negative for abdominal pain, blood in stool, constipation, diarrhea and melena.       Now only 2 loose stools per day  Genitourinary: Negative for dysuria.  Musculoskeletal: Negative for falls.  Skin: Negative for rash.  Neurological: Negative for dizziness, tingling, sensory change, loss of consciousness and weakness.  Endo/Heme/Allergies: Does not bruise/bleed easily.  Psychiatric/Behavioral: Negative for depression and memory loss.    Health Maintenance  Topic Date Due  . Hepatitis C Screening  [birth year]  . OPHTHALMOLOGY EXAM  11/02/1957  . ZOSTAVAX  11/03/2007  . PNA vac Low Risk Adult (1 of 2 - PCV13) 11/02/2012  .  INFLUENZA VACCINE  03/04/2016  . HEMOGLOBIN A1C  07/08/2016  . FOOT EXAM  01/13/2017  . MAMMOGRAM  10/21/2017  . TETANUS/TDAP  12/31/2024  . COLONOSCOPY  05/13/2025  . DEXA SCAN  Completed    Physical Exam: Vitals:   03/17/16 1014  BP: 120/70  Pulse: 64  Temp: 98.2 F (36.8 C)  TempSrc: Oral  SpO2: 98%  Weight: 123 lb (55.8 kg)  Height: 5\' 2"  (1.575 m)   Body mass index is 22.5 kg/m. Physical Exam  Constitutional: She is oriented to person, place, and time. She appears well-developed and well-nourished. No distress.  Eyes:  glasses  Cardiovascular: Normal rate, regular rhythm, normal heart sounds and intact distal  pulses.   Pulmonary/Chest: Effort normal and breath sounds normal. No respiratory distress.  Abdominal: Soft. Bowel sounds are normal. She exhibits no distension. There is no tenderness. There is no guarding.  Neurological: She is alert and oriented to person, place, and time.  Skin: Skin is warm and dry.    Labs reviewed: Basic Metabolic Panel:  Recent Labs  08/08/15 1003 01/07/16 0854  NA 140 140  K 4.2 4.3  CL 100 100  CO2 27 22  GLUCOSE 128* 141*  BUN 14 17  CREATININE 0.75 0.84  CALCIUM 9.5 9.5   Liver Function Tests:  Recent Labs  08/08/15 1003 01/07/16 0854  AST 14 12  ALT 24 19  ALKPHOS 95 81  BILITOT 0.3 0.2  PROT 7.1 7.0  ALBUMIN 4.3 4.3   No results for input(s): LIPASE, AMYLASE in the last 8760 hours. No results for input(s): AMMONIA in the last 8760 hours. CBC:  Recent Labs  08/08/15 1003  WBC 8.7  NEUTROABS 3.5  HCT 34.3  MCV 86  PLT 454*   Lipid Panel:  Recent Labs  08/08/15 1003 01/07/16 0854  CHOL 207* 166  HDL 52 57  LDLCALC 117* 81  TRIG 191* 138  CHOLHDL 4.0 2.9   Lab Results  Component Value Date   HGBA1C 8.6 (H) 01/07/2016    Assessment/Plan 1. Type 2 diabetes mellitus without complication, without long-term current use of insulin (HCC) - cont jardiance -add tradjenta to get fasting glucose to goal of 90-120 range - Hemoglobin A1c; Future - Basic metabolic panel; Future - empagliflozin (JARDIANCE) 25 MG TABS tablet; Take 25 mg by mouth daily.  Dispense: 30 tablet; Refill: 3 - linagliptin (TRADJENTA) 5 MG TABS tablet; Take 1 tablet (5 mg total) by mouth daily.  Dispense: 30 tablet; Refill: 3 -need copy of ophtho visit -test strip Rx sent in and await form to complete for strip coverage  2. Loose stools -improved--says she has IBS, but seems it has gotten much better off metformin  3. Essential hypertension -bp at goal with current therapy--is on ace inhibitor  4. Hyperlipidemia -cont pravachol - Lipid panel;  Future - Basic metabolic panel; Future  Labs/tests ordered:   Orders Placed This Encounter  Procedures  . Hemoglobin A1c    Standing Status:   Future    Standing Expiration Date:   07/17/2016  . Lipid panel    Standing Status:   Future    Standing Expiration Date:   07/17/2016    Order Specific Question:   Has the patient fasted?    Answer:   Yes  . Basic metabolic panel    Standing Status:   Future    Standing Expiration Date:   07/17/2016    Order Specific Question:   Has the patient fasted?  Answer:   Yes   Next appt:  04/21/2016 f/u on DMII after labs; also will need prevnar (but not at the same time as flu shot)  Alauna Hayden L. Robb Sibal, D.O. [facility] [address], [place] [address] Cell Phone (Mon-Fri 8am-5pm):  [phone] On Call:  [phone] & follow prompts after 5pm & weekends Office Phone:  [phone] Office Fax:  [phone]

## 2016-03-31 ENCOUNTER — Other Ambulatory Visit: Payer: Self-pay | Admitting: *Deleted

## 2016-03-31 DIAGNOSIS — E119 Type 2 diabetes mellitus without complications: Secondary | ICD-10-CM

## 2016-03-31 MED ORDER — LINAGLIPTIN 5 MG PO TABS: 5.0000 mg | ORAL_TABLET | Freq: Every day | ORAL | 3 refills | Status: DC

## 2016-03-31 MED ORDER — EMPAGLIFLOZIN 25 MG PO TABS: 25.0000 mg | ORAL_TABLET | Freq: Every day | ORAL | 3 refills | Status: DC

## 2016-03-31 MED ORDER — GLUCOSE BLOOD VI STRP: ORAL_STRIP | 12 refills | Status: DC

## 2016-03-31 NOTE — Telephone Encounter (Signed)
Patient requested to be faxed to pharmacy 

## 2016-04-14 ENCOUNTER — Other Ambulatory Visit: Payer: Medicare Other

## 2016-04-17 ENCOUNTER — Other Ambulatory Visit: Payer: Medicare Other

## 2016-04-17 DIAGNOSIS — E785 Hyperlipidemia, unspecified: Secondary | ICD-10-CM | POA: Diagnosis not present

## 2016-04-17 DIAGNOSIS — E119 Type 2 diabetes mellitus without complications: Secondary | ICD-10-CM

## 2016-04-17 LAB — LIPID PANEL
Cholesterol: 196 mg/dL (ref 125–200)
HDL: 61 mg/dL (ref >=46)
LDL Cholesterol: 103 mg/dL (ref <130)
Total CHOL/HDL Ratio: 3.2 Ratio (ref <=5.0)
Triglycerides: 158 mg/dL — ABNORMAL HIGH (ref <150)
VLDL: 32 mg/dL — ABNORMAL HIGH (ref <30)

## 2016-04-17 LAB — BASIC METABOLIC PANEL
BUN: 18 mg/dL (ref 7–25)
CO2: 26 mmol/L (ref 20–31)
Calcium: 9.4 mg/dL (ref 8.6–10.4)
Chloride: 105 mmol/L (ref 98–110)
Creat: 0.76 mg/dL (ref 0.50–0.99)
Glucose, Bld: 127 mg/dL — ABNORMAL HIGH (ref 65–99)
Potassium: 4.1 mmol/L (ref 3.5–5.3)
Sodium: 140 mmol/L (ref 135–146)

## 2016-04-17 LAB — HEMOGLOBIN A1C
Hgb A1c MFr Bld: 7 % — ABNORMAL HIGH (ref <5.7)
Mean Plasma Glucose: 154 mg/dL

## 2016-04-21 ENCOUNTER — Ambulatory Visit (INDEPENDENT_AMBULATORY_CARE_PROVIDER_SITE_OTHER): Payer: Medicare Other | Admitting: Internal Medicine

## 2016-04-21 ENCOUNTER — Encounter: Payer: Self-pay | Admitting: Internal Medicine

## 2016-04-21 ENCOUNTER — Ambulatory Visit: Payer: Medicare Other | Admitting: Internal Medicine

## 2016-04-21 VITALS — BP 120/70 | HR 69 | Temp 98.2°F | Wt 123.0 lb

## 2016-04-21 DIAGNOSIS — Z23 Encounter for immunization: Secondary | ICD-10-CM | POA: Diagnosis not present

## 2016-04-21 DIAGNOSIS — E119 Type 2 diabetes mellitus without complications: Secondary | ICD-10-CM

## 2016-04-21 DIAGNOSIS — E559 Vitamin D deficiency, unspecified: Secondary | ICD-10-CM | POA: Diagnosis not present

## 2016-04-21 DIAGNOSIS — I10 Essential (primary) hypertension: Secondary | ICD-10-CM | POA: Diagnosis not present

## 2016-04-21 DIAGNOSIS — E785 Hyperlipidemia, unspecified: Secondary | ICD-10-CM | POA: Diagnosis not present

## 2016-04-21 MED ORDER — CALCIUM CARBONATE-VITAMIN D 600-400 MG-UNIT PO TABS: 1.0000 | ORAL_TABLET | Freq: Every day | ORAL | 3 refills | Status: AC

## 2016-04-21 NOTE — Progress Notes (Signed)
Location:  [facility] clinic Provider:  Clessie Karras L. Mariea Clonts, D.O., C.M.D.  Code Status: full code Goals of Care:  Advanced Directives 03/17/2016  Does patient have an advance directive? No  Would patient like information on creating an advanced directive? No - patient declined information   Chief Complaint  Patient presents with  . Medical Management of Chronic Issues    62mth follow-up    HPI: Patient is a 70 y.o. female seen today for medical management of chronic diseases.    DMII:  131 this am.  Running up and down.  She is not checking daily.  97 lowest.  Usually 120s.  She has reduced her sugar intake in her tea.  Has to eat rice at lunch or dinner b/c she cannot sleep.   Chasing her active grandson a lot.  Will also walk some with him.  She increased her egg intake which may have increased her bad cholesterol. Sometimes eggs daily and desserts tend to have a lot of eggs.   Not consistent.    Past Medical History:  Diagnosis Date  . Disorder of bone and cartilage, unspecified   . Dizziness and giddiness   . Hypercalcemia   . Hyperlipidemia   . Hypertension   . Hypopotassemia   . Other malaise and fatigue   . Other specified disease of white blood cells   . Special screening for malignant neoplasms, colon   . Type II or unspecified type diabetes mellitus without mention of complication, not stated as uncontrolled   . Unspecified vitamin D deficiency     Past Surgical History:  Procedure Laterality Date  . COLONOSCOPY  05/14/15   Dr. Collene Mares  . FRACTURE SURGERY  2001   Tibia & fibula  . TONSILLECTOMY  1996    Allergies  Allergen Reactions  . Metformin And Related Diarrhea      Medication List       Accurate as of 04/21/16  2:51 PM. Always use your most recent med list.          benazepril-hydrochlorthiazide 20-12.5 MG tablet Commonly known as:  LOTENSIN HCT take 1 tablet by mouth once daily   calcium-vitamin D 250-125 MG-UNIT tablet Commonly known as:   OSCAL Take 1 tablet by mouth 2 (two) times daily.   empagliflozin 25 MG Tabs tablet Commonly known as:  JARDIANCE Take 25 mg by mouth daily.   freestyle lancets TEST as directed   glucose blood test strip Commonly known as:  FREESTYLE LITE Use as instructed to test blood sugar once daily dx E11.9   linagliptin 5 MG Tabs tablet Commonly known as:  TRADJENTA Take 1 tablet (5 mg total) by mouth daily.   omeprazole 40 MG capsule Commonly known as:  PRILOSEC Take 40 mg by mouth daily. Reported on 09/10/2015   pravastatin 80 MG tablet Commonly known as:  PRAVACHOL take 1 tablet by mouth once daily       Review of Systems:  Review of Systems  Constitutional: Negative for chills and fever.  HENT: Negative for hearing loss.   Eyes: Negative for blurred vision.  Respiratory: Negative for shortness of breath.   Cardiovascular: Negative for chest pain, palpitations and leg swelling.  Gastrointestinal: Positive for constipation. Negative for abdominal pain, blood in stool, diarrhea and melena.  Genitourinary: Negative for dysuria.  Musculoskeletal: Negative for falls and myalgias.  Skin: Negative for itching and rash.  Neurological: Negative for dizziness and loss of consciousness.  Endo/Heme/Allergies: Does not bruise/bleed easily.  Psychiatric/Behavioral:  Negative for depression and memory loss.    Health Maintenance  Topic Date Due  . Hepatitis C Screening  1946-05-05  . OPHTHALMOLOGY EXAM  11/02/1957  . ZOSTAVAX  11/03/2007  . PNA vac Low Risk Adult (1 of 2 - PCV13) 11/02/2012  . INFLUENZA VACCINE  03/04/2016  . HEMOGLOBIN A1C  10/15/2016  . FOOT EXAM  01/13/2017  . MAMMOGRAM  10/21/2017  . TETANUS/TDAP  12/31/2024  . COLONOSCOPY  05/13/2025  . DEXA SCAN  Completed    Physical Exam: Vitals:   04/21/16 1440  BP: 120/70  Pulse: 69  Temp: 98.2 F (36.8 C)  TempSrc: Oral  SpO2: 98%  Weight: 123 lb (55.8 kg)   Body mass index is 22.5 kg/m. Physical Exam   Constitutional: She is oriented to person, place, and time. She appears well-developed and well-nourished. No distress.  Cardiovascular: Normal rate, regular rhythm, normal heart sounds and intact distal pulses.   Pulmonary/Chest: Effort normal and breath sounds normal.  Musculoskeletal: Normal range of motion.  Neurological: She is alert and oriented to person, place, and time.  Skin: Skin is warm and dry.  Psychiatric: She has a normal mood and affect.   Labs reviewed: Basic Metabolic Panel:  Recent Labs  08/08/15 1003 01/07/16 0854 04/17/16 0858  NA 140 140 140  K 4.2 4.3 4.1  CL 100 100 105  CO2 27 22 26   GLUCOSE 128* 141* 127*  BUN 14 17 18   CREATININE 0.75 0.84 0.76  CALCIUM 9.5 9.5 9.4   Liver Function Tests:  Recent Labs  08/08/15 1003 01/07/16 0854  AST 14 12  ALT 24 19  ALKPHOS 95 81  BILITOT 0.3 0.2  PROT 7.1 7.0  ALBUMIN 4.3 4.3   No results for input(s): LIPASE, AMYLASE in the last 8760 hours. No results for input(s): AMMONIA in the last 8760 hours. CBC:  Recent Labs  08/08/15 1003  WBC 8.7  NEUTROABS 3.5  HCT 34.3  MCV 86  PLT 454*   Lipid Panel:  Recent Labs  08/08/15 1003 01/07/16 0854 04/17/16 0858  CHOL 207* 166 196  HDL 52 57 61  LDLCALC 117* 81 103  TRIG 191* 138 158*  CHOLHDL 4.0 2.9 3.2   Lab Results  Component Value Date   HGBA1C 7.0 (H) 04/17/2016    Assessment/Plan 1. Type 2 diabetes mellitus without complication, without long-term current use of insulin (HCC) -cont tradjenta and jardiance which appear to be working well--she has not even been on it the full 3 mos so expect hba1c might be even better next check -also continue walking and working on diet -is on ace  2. Essential hypertension -bp at goal with benazepril/hctz  3. Vitamin D deficiency -change type of calcium to caltrate with D  4. Hyperlipidemia -LDL and TG increased since last visit--cont pravachol 80mg  -if still elevated over 100 next time,  will change statin -discussed that goal is less than 70 due to DMII  5. Need for vaccination with 13-polyvalent pneumococcal conjugate vaccine -prevnar given today  Labs/tests ordered:  Orders Placed This Encounter  Procedures  . Hemoglobin A1c    Standing Status:   Future    Standing Expiration Date:   10/19/2016  . Lipid panel    Standing Status:   Future    Standing Expiration Date:   10/19/2016    Order Specific Question:   Has the patient fasted?    Answer:   Yes    Next appt:  3 mos for  annual wellness with labs before   Novinger. Lukis Bunt, D.O. [facility] [address], [place] [address] Cell Phone (Mon-Fri 8am-5pm):  [phone] On Call:  [phone] & follow prompts after 5pm & weekends Office Phone:  [phone] Office Fax:  [phone]

## 2016-05-28 ENCOUNTER — Telehealth: Payer: Self-pay | Admitting: Internal Medicine

## 2016-05-28 NOTE — Telephone Encounter (Signed)
left msg asking if pt can come at 1:00 for AWV with nurse first. VDM (Dee-Dee)

## 2016-06-25 ENCOUNTER — Other Ambulatory Visit: Payer: Self-pay | Admitting: Nurse Practitioner

## 2016-07-09 ENCOUNTER — Other Ambulatory Visit: Payer: Self-pay | Admitting: *Deleted

## 2016-07-09 ENCOUNTER — Other Ambulatory Visit: Payer: Self-pay

## 2016-07-09 DIAGNOSIS — E119 Type 2 diabetes mellitus without complications: Secondary | ICD-10-CM

## 2016-07-09 MED ORDER — EMPAGLIFLOZIN 25 MG PO TABS: 25.0000 mg | ORAL_TABLET | Freq: Every day | ORAL | 1 refills | Status: DC

## 2016-07-09 MED ORDER — BENAZEPRIL-HYDROCHLOROTHIAZIDE 20-12.5 MG PO TABS: 1.0000 | ORAL_TABLET | Freq: Every day | ORAL | 1 refills | Status: DC

## 2016-08-05 ENCOUNTER — Other Ambulatory Visit: Payer: Medicare Other

## 2016-08-05 DIAGNOSIS — E119 Type 2 diabetes mellitus without complications: Secondary | ICD-10-CM

## 2016-08-05 DIAGNOSIS — E785 Hyperlipidemia, unspecified: Secondary | ICD-10-CM | POA: Diagnosis not present

## 2016-08-05 LAB — LIPID PANEL
Cholesterol: 187 mg/dL (ref <200)
HDL: 57 mg/dL (ref >50)
LDL Cholesterol: 97 mg/dL (ref <100)
Total CHOL/HDL Ratio: 3.3 Ratio (ref <5.0)
Triglycerides: 163 mg/dL — ABNORMAL HIGH (ref <150)
VLDL: 33 mg/dL — ABNORMAL HIGH (ref <30)

## 2016-08-06 LAB — HEMOGLOBIN A1C
Hgb A1c MFr Bld: 7.8 % — ABNORMAL HIGH (ref <5.7)
Mean Plasma Glucose: 177 mg/dL

## 2016-08-07 ENCOUNTER — Encounter: Payer: Self-pay | Admitting: *Deleted

## 2016-08-11 ENCOUNTER — Ambulatory Visit: Payer: Medicare Other | Admitting: Internal Medicine

## 2016-08-11 ENCOUNTER — Ambulatory Visit: Payer: Medicare Other

## 2016-08-18 ENCOUNTER — Encounter: Payer: Self-pay | Admitting: Internal Medicine

## 2016-08-18 ENCOUNTER — Ambulatory Visit (INDEPENDENT_AMBULATORY_CARE_PROVIDER_SITE_OTHER): Payer: Medicare Other | Admitting: Internal Medicine

## 2016-08-18 VITALS — BP 120/70 | HR 73 | Temp 97.7°F | Wt 123.0 lb

## 2016-08-18 DIAGNOSIS — E782 Mixed hyperlipidemia: Secondary | ICD-10-CM

## 2016-08-18 DIAGNOSIS — L659 Nonscarring hair loss, unspecified: Secondary | ICD-10-CM | POA: Diagnosis not present

## 2016-08-18 DIAGNOSIS — E119 Type 2 diabetes mellitus without complications: Secondary | ICD-10-CM

## 2016-08-18 DIAGNOSIS — I10 Essential (primary) hypertension: Secondary | ICD-10-CM | POA: Diagnosis not present

## 2016-08-18 NOTE — Progress Notes (Signed)
Location:  [facility] clinic Provider:  Tollie Canada L. Mariea Clonts, D.O., C.M.D.  Code Status: full code Goals of Care:  Advanced Directives 08/18/2016  Does Patient Have a Medical Advance Directive? No  Would patient like information on creating a medical advance directive? -   Chief Complaint  Patient presents with  . Follow-up    discuss lab results    HPI: Patient is a 71 y.o. female from [place] seen today for medical management of chronic diseases.  She would like to review her labs.   She is checking it again and her sugars are running 120s again.  Had been high over the holidays.  She is taking her jardiance and tradjenta.  She has a new grandbaby.  She says she's lost weight and is 123.  Family was concerned about her weight loss.  No longer having frequent bms.   Hair is thinning also.    She does not want the flu shot.  Has not had flu but the year she took the shot.    Past Medical History:  Diagnosis Date  . Disorder of bone and cartilage, unspecified   . Dizziness and giddiness   . Hypercalcemia   . Hyperlipidemia   . Hypertension   . Hypopotassemia   . Other malaise and fatigue   . Other specified disease of white blood cells   . Special screening for malignant neoplasms, colon   . Type II or unspecified type diabetes mellitus without mention of complication, not stated as uncontrolled   . Unspecified vitamin D deficiency     Past Surgical History:  Procedure Laterality Date  . COLONOSCOPY  05/14/15   Dr. Collene Mares  . FRACTURE SURGERY  2001   Tibia & fibula  . TONSILLECTOMY  1996    Allergies  Allergen Reactions  . Metformin And Related Diarrhea    Allergies as of 08/18/2016      Reactions   Metformin And Related Diarrhea      Medication List       Accurate as of 08/18/16 10:55 AM. Always use your most recent med list.          benazepril-hydrochlorthiazide 20-12.5 MG tablet Commonly known as:  LOTENSIN HCT Take 1 tablet by mouth daily.   Calcium  Carbonate-Vitamin D 600-400 MG-UNIT tablet Take 1 tablet by mouth daily.   empagliflozin 25 MG Tabs tablet Commonly known as:  JARDIANCE Take 25 mg by mouth daily.   freestyle lancets TEST as directed   glucose blood test strip Commonly known as:  FREESTYLE LITE Use as instructed to test blood sugar once daily dx E11.9   linagliptin 5 MG Tabs tablet Commonly known as:  TRADJENTA Take 1 tablet (5 mg total) by mouth daily.   omeprazole 40 MG capsule Commonly known as:  PRILOSEC Take 40 mg by mouth daily. Reported on 09/10/2015   pravastatin 80 MG tablet Commonly known as:  PRAVACHOL take 1 tablet by mouth once daily       Review of Systems:  Review of Systems  Constitutional: Negative for chills, fever and malaise/fatigue.  HENT: Negative for hearing loss.   Eyes: Negative for blurred vision.  Respiratory: Negative for shortness of breath.   Cardiovascular: Negative for chest pain and palpitations.  Gastrointestinal: Negative for abdominal pain, blood in stool, constipation, diarrhea and melena.  Genitourinary: Negative for dysuria.  Musculoskeletal: Negative for falls.  Neurological: Negative for dizziness, loss of consciousness and weakness.  Endo/Heme/Allergies:       Thinning  hair; diabetes  Psychiatric/Behavioral: Negative for depression and memory loss.    Health Maintenance  Topic Date Due  . Hepatitis C Screening  November 28, 1945  . OPHTHALMOLOGY EXAM  11/02/1957  . ZOSTAVAX  11/03/2007  . INFLUENZA VACCINE  03/04/2016  . FOOT EXAM  01/13/2017  . HEMOGLOBIN A1C  02/02/2017  . PNA vac Low Risk Adult (2 of 2 - PPSV23) 04/21/2017  . MAMMOGRAM  10/21/2017  . TETANUS/TDAP  12/31/2024  . COLONOSCOPY  05/13/2025  . DEXA SCAN  Completed    Physical Exam: Vitals:   08/18/16 1036  BP: 120/70  Pulse: 73  Temp: 97.7 F (36.5 C)  TempSrc: Oral  SpO2: 98%  Weight: 123 lb (55.8 kg)   Body mass index is 22.5 kg/m. Physical Exam  Constitutional: She is oriented  to person, place, and time. She appears well-developed and well-nourished. No distress.  Cardiovascular: Normal rate, regular rhythm, normal heart sounds and intact distal pulses.   Pulmonary/Chest: Effort normal and breath sounds normal. No respiratory distress.  Musculoskeletal: Normal range of motion.  Neurological: She is alert and oriented to person, place, and time.  Skin: Skin is warm and dry.  Psychiatric: She has a normal mood and affect.    Labs reviewed: Basic Metabolic Panel:  Recent Labs  01/07/16 0854 04/17/16 0858  NA 140 140  K 4.3 4.1  CL 100 105  CO2 22 26  GLUCOSE 141* 127*  BUN 17 18  CREATININE 0.84 0.76  CALCIUM 9.5 9.4   Liver Function Tests:  Recent Labs  01/07/16 0854  AST 12  ALT 19  ALKPHOS 81  BILITOT 0.2  PROT 7.0  ALBUMIN 4.3   No results for input(s): LIPASE, AMYLASE in the last 8760 hours. No results for input(s): AMMONIA in the last 8760 hours. CBC: No results for input(s): WBC, NEUTROABS, HGB, HCT, MCV, PLT in the last 8760 hours. Lipid Panel:  Recent Labs  01/07/16 0854 04/17/16 0858 08/05/16 1028  CHOL 166 196 187  HDL 57 61 57  LDLCALC 81 103 97  TRIG 138 158* 163*  CHOLHDL 2.9 3.2 3.3   Lab Results  Component Value Date   HGBA1C 7.8 (H) 08/05/2016    Assessment/Plan 1. Type 2 diabetes mellitus without complication, without long-term current use of insulin (HCC) - cont tradjenta and jardiance, had eaten poorly over the holidays but back to regular diet - Hemoglobin A1c; Future - Lipid panel; Future - TSH  2. Essential hypertension -bp at goal, cont same regimen which includes ACE  3. Mixed hyperlipidemia -cont pravachol, TG had gone up just slightly but she's back on her regular diet - Hemoglobin A1c; Future - Lipid panel; Future  4. Thinning hair - TSH to rule this out -ok to take biotin  Labs/tests ordered:   Orders Placed This Encounter  Procedures  . Hemoglobin A1c    Standing Status:   Future      Standing Expiration Date:   02/15/2017  . Lipid panel    Standing Status:   Future    Standing Expiration Date:   02/15/2017    Order Specific Question:   Has the patient fasted?    Answer:   Yes  . TSH   Next appt:  3 mos labs before   Esbeydi Manago L. Chiquita Heckert, D.O. [facility] [address], [place] [address] Cell Phone (Mon-Fri 8am-5pm):  [phone] On Call:  [phone] & follow prompts after 5pm & weekends Office  Phone:  [phone] Office Fax:  [phone]

## 2016-08-19 LAB — TSH: TSH: 0.73 mIU/L

## 2016-08-25 ENCOUNTER — Encounter: Payer: Self-pay | Admitting: *Deleted

## 2016-09-09 ENCOUNTER — Telehealth: Payer: Self-pay | Admitting: Internal Medicine

## 2016-09-09 NOTE — Telephone Encounter (Signed)
left msg asking pt to confirm this AWV appt w/ nurse after labs. lab time moved from 8:30 to 8:15. VDM (DD)

## 2016-10-13 ENCOUNTER — Other Ambulatory Visit: Payer: Self-pay | Admitting: Internal Medicine

## 2016-11-17 ENCOUNTER — Ambulatory Visit (INDEPENDENT_AMBULATORY_CARE_PROVIDER_SITE_OTHER): Payer: Medicare Other | Admitting: Internal Medicine

## 2016-11-17 VITALS — BP 112/60 | HR 67 | Temp 97.7°F | Wt 125.0 lb

## 2016-11-17 DIAGNOSIS — S46001A Unspecified injury of muscle(s) and tendon(s) of the rotator cuff of right shoulder, initial encounter: Secondary | ICD-10-CM | POA: Diagnosis not present

## 2016-11-17 NOTE — Progress Notes (Signed)
Location:  [facility] clinic Provider: Latarra Eagleton L. Mariea Clonts, D.O., C.M.D.  Code Status: full code Goals of Care:  Advanced Directives 08/18/2016  Does Patient Have a Medical Advance Directive? No  Would patient like information on creating a medical advance directive? -   Chief Complaint  Patient presents with  . Acute Visit    right shoulder pain x69mth    HPI: Patient is a 71 y.o. female seen today for an acute visit for right shoulder pain for actually 2 months but worse the past two weeks.  Pain is in posterior shoulder and down upper arm.  She's unable to lift it w/o pain, but can lift it above 90.   Was moving flower pots to the outside.  That may have done it.  Has been trying exercises, but still hurting.  Also has been holding 15 lb and 40 lb boy lately. Has not really taken anything so far for pain or put anything on her shoulder.  Past Medical History:  Diagnosis Date  . Disorder of bone and cartilage, unspecified   . Dizziness and giddiness   . Hypercalcemia   . Hyperlipidemia   . Hypertension   . Hypopotassemia   . Other malaise and fatigue   . Other specified disease of white blood cells   . Special screening for malignant neoplasms, colon   . Type II or unspecified type diabetes mellitus without mention of complication, not stated as uncontrolled   . Unspecified vitamin D deficiency     Past Surgical History:  Procedure Laterality Date  . COLONOSCOPY  05/14/15   Dr. Collene Mares  . FRACTURE SURGERY  2001   Tibia & fibula  . TONSILLECTOMY  1996    Allergies  Allergen Reactions  . Metformin And Related Diarrhea    Allergies as of 11/17/2016      Reactions   Metformin And Related Diarrhea      Medication List       Accurate as of 11/17/16  3:56 PM. Always use your most recent med list.          benazepril-hydrochlorthiazide 20-12.5 MG tablet Commonly known as:  LOTENSIN HCT take 1 tablet by mouth once daily   Calcium Carbonate-Vitamin D 600-400 MG-UNIT  tablet Take 1 tablet by mouth daily.   empagliflozin 25 MG Tabs tablet Commonly known as:  JARDIANCE Take 25 mg by mouth daily.   freestyle lancets TEST as directed   glucose blood test strip Commonly known as:  FREESTYLE LITE Use as instructed to test blood sugar once daily dx E11.9   linagliptin 5 MG Tabs tablet Commonly known as:  TRADJENTA Take 1 tablet (5 mg total) by mouth daily.   omeprazole 40 MG capsule Commonly known as:  PRILOSEC Take 40 mg by mouth daily. Reported on 09/10/2015   pravastatin 80 MG tablet Commonly known as:  PRAVACHOL take 1 tablet by mouth once daily       Review of Systems:  Review of Systems  Constitutional: Negative for chills and fever.  HENT: Negative for hearing loss.   Eyes: Negative for blurred vision.  Respiratory: Negative for shortness of breath.   Cardiovascular: Negative for chest pain and palpitations.  Gastrointestinal: Negative for abdominal pain, blood in stool, constipation, diarrhea and melena.  Genitourinary: Negative for dysuria.  Musculoskeletal: Positive for joint pain. Negative for back pain, falls, myalgias and neck pain.       Right shoulder  Skin: Negative for itching and rash.  Neurological: Negative for dizziness,  tingling, sensory change and focal weakness.  Endo/Heme/Allergies: Does not bruise/bleed easily.  Psychiatric/Behavioral: Negative for depression and memory loss.    Health Maintenance  Topic Date Due  . Hepatitis C Screening  [birth year]  . OPHTHALMOLOGY EXAM  11/02/1957  . FOOT EXAM  01/13/2017  . HEMOGLOBIN A1C  02/02/2017  . INFLUENZA VACCINE  03/04/2017  . PNA vac Low Risk Adult (2 of 2 - PPSV23) 04/21/2017  . MAMMOGRAM  10/21/2017  . TETANUS/TDAP  12/31/2024  . COLONOSCOPY  05/13/2025  . DEXA SCAN  Completed    Physical Exam: Vitals:   11/17/16 1538  BP: 112/60  Pulse: 67  Temp: 97.7 F (36.5 C)  TempSrc: Oral  SpO2: 95%  Weight: 125 lb (56.7 kg)   Body mass index is 22.86  kg/m. Physical Exam  Constitutional: She is oriented to person, place, and time. She appears well-developed and well-nourished. No distress.  Cardiovascular: Normal rate, regular rhythm, normal heart sounds and intact distal pulses.   Pulmonary/Chest: Effort normal and breath sounds normal. No respiratory distress.  Musculoskeletal: She exhibits tenderness.  Right posterior shoulder and down upper arm, able to abduct fully, but painful and grimaces during ROM, is able to fasten bra or necklace also  Neurological: She is alert and oriented to person, place, and time.  Skin: Skin is warm and dry.  Psychiatric: She has a normal mood and affect.    Labs reviewed: Basic Metabolic Panel:  Recent Labs  01/07/16 0854 04/17/16 0858 08/18/16 1118  NA 140 140  --   K 4.3 4.1  --   CL 100 105  --   CO2 22 26  --   GLUCOSE 141* 127*  --   BUN 17 18  --   CREATININE 0.84 0.76  --   CALCIUM 9.5 9.4  --   TSH  --   --  0.73   Liver Function Tests:  Recent Labs  01/07/16 0854  AST 12  ALT 19  ALKPHOS 81  BILITOT 0.2  PROT 7.0  ALBUMIN 4.3   No results for input(s): LIPASE, AMYLASE in the last 8760 hours. No results for input(s): AMMONIA in the last 8760 hours. CBC: No results for input(s): WBC, NEUTROABS, HGB, HCT, MCV, PLT in the last 8760 hours. Lipid Panel:  Recent Labs  01/07/16 0854 04/17/16 0858 08/05/16 1028  CHOL 166 196 187  HDL 57 61 57  LDLCALC 81 103 97  TRIG 138 158* 163*  CHOLHDL 2.9 3.2 3.3   Lab Results  Component Value Date   HGBA1C 7.8 (H) 08/05/2016    Assessment/Plan 1. Injury of right rotator cuff, initial encounter Aleve 220mg  twice a day for one week.   You may use the tylenol after that.   You may also use topical over the counter medicine on the shoulder. Ice may feel good.   We'll get the MRI to see if any tendons are torn if you are not better when I see you for your regular visit.    Labs/tests ordered:  No orders of the defined  types were placed in this encounter.   Next appt:  11/18/2016  Joann Kennedy L. Jaycelynn Knickerbocker, D.O. [facility] [address], [place] [address] Cell Phone (Mon-Fri 8am-5pm):  [phone] On Call:  [phone] & follow prompts after 5pm & weekends Office Phone:  [phone] Office Fax:  [phone]

## 2016-11-17 NOTE — Patient Instructions (Addendum)
Aleve 220mg  twice a day for one week.   You may use the tylenol after that.   You may also use topical over the counter medicine on the shoulder. Ice may feel good.   We'll get the MRI to see if any tendons are torn if you are not better when I see you.

## 2016-11-18 ENCOUNTER — Ambulatory Visit (INDEPENDENT_AMBULATORY_CARE_PROVIDER_SITE_OTHER): Payer: Medicare Other

## 2016-11-18 ENCOUNTER — Other Ambulatory Visit: Payer: Medicare Other

## 2016-11-18 VITALS — BP 110/76 | HR 70 | Temp 97.6°F | Ht 62.0 in | Wt 123.0 lb

## 2016-11-18 DIAGNOSIS — E119 Type 2 diabetes mellitus without complications: Secondary | ICD-10-CM | POA: Diagnosis not present

## 2016-11-18 DIAGNOSIS — E782 Mixed hyperlipidemia: Secondary | ICD-10-CM

## 2016-11-18 DIAGNOSIS — Z0000 Encounter for general adult medical examination without abnormal findings: Secondary | ICD-10-CM

## 2016-11-18 LAB — LIPID PANEL
Cholesterol: 162 mg/dL (ref <200)
HDL: 57 mg/dL (ref >50)
LDL Cholesterol: 46 mg/dL (ref <100)
Total CHOL/HDL Ratio: 2.8 Ratio (ref <5.0)
Triglycerides: 296 mg/dL — ABNORMAL HIGH (ref <150)
VLDL: 59 mg/dL — ABNORMAL HIGH (ref <30)

## 2016-11-18 NOTE — Patient Instructions (Addendum)
Joann Kennedy , Thank you for taking time to come for your Medicare Wellness Visit. I appreciate your ongoing commitment to your health goals. Please review the following plan we discussed and let me know if I can assist you in the future.   Screening recommendations/referrals: Colonoscopy due 05/2025  Mammogram due 10/2017 Bone Density up to date Recommended yearly ophthalmology/optometry visit for glaucoma screening and checkup Recommended yearly dental visit for hygiene and checkup  Vaccinations: Influenza vaccine up to date Pneumococcal vaccine 2/2 due 04/2017 Tdap vaccine due 12/2024 Shingles vaccine due. If you decide you want it after calling your pharmacy let us know and we will give you prescription  Advanced directives: Advance directive discussed with you today. I have provided a copy for you to complete at home and have notarized. Once this is complete please bring a copy in to our office so we can scan it into your chart.   Conditions/risks identified: none  Next appointment: 11/24/2016 @ 1:15 Dr Mariea Clonts   Preventive Care 65 Years and Older, Female Preventive care refers to lifestyle choices and visits with your health care provider that can promote health and wellness. What does preventive care include?  A yearly physical exam. This is also called an annual well check.  Dental exams once or twice a year.  Routine eye exams. Ask your health care provider how often you should have your eyes checked.  Personal lifestyle choices, including:  Daily care of your teeth and gums.  Regular physical activity.  Eating a healthy diet.  Avoiding tobacco and drug use.  Limiting alcohol use.  Practicing safe sex.  Taking low-dose aspirin every day.  Taking vitamin and mineral supplements as recommended by your health care provider. What happens during an annual well check? The services and screenings done by your health care provider during your annual well check will  depend on your age, overall health, lifestyle risk factors, and family history of disease. Counseling  Your health care provider may ask you questions about your:  Alcohol use.  Tobacco use.  Drug use.  Emotional well-being.  Home and relationship well-being.  Sexual activity.  Eating habits.  History of falls.  Memory and ability to understand (cognition).  Work and work Statistician.  Reproductive health. Screening  You may have the following tests or measurements:  Height, weight, and BMI.  Blood pressure.  Lipid and cholesterol levels. These may be checked every 5 years, or more frequently if you are over 61 years old.  Skin check.  Lung cancer screening. You may have this screening every year starting at age 68 if you have a 30-pack-year history of smoking and currently smoke or have quit within the past 15 years.  Fecal occult blood test (FOBT) of the stool. You may have this test every year starting at age 32.  Flexible sigmoidoscopy or colonoscopy. You may have a sigmoidoscopy every 5 years or a colonoscopy every 10 years starting at age 77.  Hepatitis C blood test.  Hepatitis B blood test.  Sexually transmitted disease (STD) testing.  Diabetes screening. This is done by checking your blood sugar (glucose) after you have not eaten for a while (fasting). You may have this done every 1-3 years.  Bone density scan. This is done to screen for osteoporosis. You may have this done starting at age 10.  Mammogram. This may be done every 1-2 years. Talk to your health care provider about how often you should have regular mammograms. Talk with your health  care provider about your test results, treatment options, and if necessary, the need for more tests. Vaccines  Your health care provider may recommend certain vaccines, such as:  Influenza vaccine. This is recommended every year.  Tetanus, diphtheria, and acellular pertussis (Tdap, Td) vaccine. You may need a  Td booster every 10 years.  Zoster vaccine. You may need this after age 20.  Pneumococcal 13-valent conjugate (PCV13) vaccine. One dose is recommended after age 72.  Pneumococcal polysaccharide (PPSV23) vaccine. One dose is recommended after age 84. Talk to your health care provider about which screenings and vaccines you need and how often you need them. This information is not intended to replace advice given to you by your health care provider. Make sure you discuss any questions you have with your health care provider. Document Released: 08/17/2015 Document Revised: 04/09/2016 Document Reviewed: 05/22/2015 Elsevier Interactive Patient Education  2017 [place] Prevention in the Home Falls can cause injuries. They can happen to people of all ages. There are many things you can do to make your home safe and to help prevent falls. What can I do on the outside of my home?  Regularly fix the edges of walkways and driveways and fix any cracks.  Remove anything that might make you trip as you walk through a door, such as a raised step or threshold.  Trim any bushes or trees on the path to your home.  Use bright outdoor lighting.  Clear any walking paths of anything that might make someone trip, such as rocks or tools.  Regularly check to see if handrails are loose or broken. Make sure that both sides of any steps have handrails.  Any raised decks and porches should have guardrails on the edges.  Have any leaves, snow, or ice cleared regularly.  Use sand or salt on walking paths during winter.  Clean up any spills in your garage right away. This includes oil or grease spills. What can I do in the bathroom?  Use night lights.  Install grab bars by the toilet and in the tub and shower. Do not use towel bars as grab bars.  Use non-skid mats or decals in the tub or shower.  If you need to sit down in the shower, use a plastic, non-slip stool.  Keep the floor dry. Clean  up any water that spills on the floor as soon as it happens.  Remove soap buildup in the tub or shower regularly.  Attach bath mats securely with double-sided non-slip rug tape.  Do not have throw rugs and other things on the floor that can make you trip. What can I do in the bedroom?  Use night lights.  Make sure that you have a light by your bed that is easy to reach.  Do not use any sheets or blankets that are too big for your bed. They should not hang down onto the floor.  Have a firm chair that has side arms. You can use this for support while you get dressed.  Do not have throw rugs and other things on the floor that can make you trip. What can I do in the kitchen?  Clean up any spills right away.  Avoid walking on wet floors.  Keep items that you use a lot in easy-to-reach places.  If you need to reach something above you, use a strong step stool that has a grab bar.  Keep electrical cords out of the way.  Do not use floor  polish or wax that makes floors slippery. If you must use wax, use non-skid floor wax.  Do not have throw rugs and other things on the floor that can make you trip. What can I do with my stairs?  Do not leave any items on the stairs.  Make sure that there are handrails on both sides of the stairs and use them. Fix handrails that are broken or loose. Make sure that handrails are as long as the stairways.  Check any carpeting to make sure that it is firmly attached to the stairs. Fix any carpet that is loose or worn.  Avoid having throw rugs at the top or bottom of the stairs. If you do have throw rugs, attach them to the floor with carpet tape.  Make sure that you have a light switch at the top of the stairs and the bottom of the stairs. If you do not have them, ask someone to add them for you. What else can I do to help prevent falls?  Wear shoes that:  Do not have high heels.  Have rubber bottoms.  Are comfortable and fit you well.  Are  closed at the toe. Do not wear sandals.  If you use a stepladder:  Make sure that it is fully opened. Do not climb a closed stepladder.  Make sure that both sides of the stepladder are locked into place.  Ask someone to hold it for you, if possible.  Clearly mark and make sure that you can see:  Any grab bars or handrails.  First and last steps.  Where the edge of each step is.  Use tools that help you move around (mobility aids) if they are needed. These include:  Canes.  Walkers.  Scooters.  Crutches.  Turn on the lights when you go into a dark area. Replace any light bulbs as soon as they burn out.  Set up your furniture so you have a clear path. Avoid moving your furniture around.  If any of your floors are uneven, fix them.  If there are any pets around you, be aware of where they are.  Review your medicines with your doctor. Some medicines can make you feel dizzy. This can increase your chance of falling. Ask your doctor what other things that you can do to help prevent falls. This information is not intended to replace advice given to you by your health care provider. Make sure you discuss any questions you have with your health care provider. Document Released: 05/17/2009 Document Revised: 12/27/2015 Document Reviewed: 08/25/2014 Elsevier Interactive Patient Education  2017 [employer].

## 2016-11-18 NOTE — Progress Notes (Signed)
Quick Notes   Health Maintenance: Pn23 due 04/2017. Hep C screen due. Eye exam due     Abnormal Screen: MMSE 27/30. Did not pass clock drawing     Patient Concerns: None     Nurse Concerns: None

## 2016-11-18 NOTE — Progress Notes (Signed)
Subjective:   Joann Kennedy is a 71 y.o. female who presents for an Initial Medicare Annual Wellness Visit.     Objective:    Today's Vitals   11/18/16 0840 11/18/16 0844  BP: 110/76   Pulse: 70   Temp: 97.6 F (36.4 C)   TempSrc: Oral   SpO2: 99%   Weight: 123 lb (55.8 kg)   Height: 5\' 2"  (1.575 m)   PainSc:  2    Body mass index is 22.5 kg/m.   Current Medications (verified) Outpatient Encounter Prescriptions as of 11/18/2016  Medication Sig  . benazepril-hydrochlorthiazide (LOTENSIN HCT) 20-12.5 MG tablet take 1 tablet by mouth once daily  . Calcium Carbonate-Vitamin D 600-400 MG-UNIT tablet Take 1 tablet by mouth daily.  . empagliflozin (JARDIANCE) 25 MG TABS tablet Take 25 mg by mouth daily.  Marland Kitchen glucose blood (FREESTYLE LITE) test strip Use as instructed to test blood sugar once daily dx E11.9  . Lancets (FREESTYLE) lancets TEST as directed  . linagliptin (TRADJENTA) 5 MG TABS tablet Take 1 tablet (5 mg total) by mouth daily.  Marland Kitchen omeprazole (PRILOSEC) 40 MG capsule Take 40 mg by mouth daily. Reported on 09/10/2015  . pravastatin (PRAVACHOL) 80 MG tablet take 1 tablet by mouth once daily   No facility-administered encounter medications on file as of 11/18/2016.     Allergies (verified) Metformin and related   History: Past Medical History:  Diagnosis Date  . Disorder of bone and cartilage, unspecified   . Dizziness and giddiness   . Hypercalcemia   . Hyperlipidemia   . Hypertension   . Hypopotassemia   . Other malaise and fatigue   . Other specified disease of white blood cells   . Special screening for malignant neoplasms, colon   . Type II or unspecified type diabetes mellitus without mention of complication, not stated as uncontrolled   . Unspecified vitamin D deficiency    Past Surgical History:  Procedure Laterality Date  . COLONOSCOPY  05/14/15   Dr. Collene Mares  . FRACTURE SURGERY  2001   Tibia & fibula  . TONSILLECTOMY  1996   Family History    Problem Relation Age of Onset  . Diabetes Mother   . Heart disease Mother   . Heart disease Brother    Social History   Occupational History  . Not on file.   Social History Main Topics  . Smoking status: Never Smoker  . Smokeless tobacco: Never Used  . Alcohol use No  . Drug use: No  . Sexual activity: Not on file    Tobacco Counseling Counseling given: Not Answered   Activities of Daily Living In your present state of health, do you have any difficulty performing the following activities: 11/18/2016  Hearing? N  Vision? N  Difficulty concentrating or making decisions? Y  Walking or climbing stairs? N  Dressing or bathing? N  Doing errands, shopping? N  Preparing Food and eating ? N  Using the Toilet? N  In the past six months, have you accidently leaked urine? N  Do you have problems with loss of bowel control? N  Managing your Medications? N  Managing your Finances? N  Housekeeping or managing your Housekeeping? N  Some recent data might be hidden    Immunizations and Health Maintenance Immunization History  Administered Date(s) Administered  . Influenza Whole 08/10/2013  . Influenza-Unspecified 05/05/2015  . Pneumococcal Conjugate-13 04/21/2016  . Tdap 01/01/2015   Health Maintenance Due  Topic Date Due  .  Hepatitis C Screening  Feb 15, 1946  . OPHTHALMOLOGY EXAM  11/02/1957    Patient Care Team: Gayland Curry, DO as PCP - General (Geriatric Medicine)  Indicate any recent Medical Services you may have received from other than [facility] providers in the past year (date may be approximate).     Assessment:   This is a routine wellness examination for Joann Kennedy.  Hearing/Vision screen No exam data present  Dietary issues and exercise activities discussed: Current Exercise Habits: Home exercise routine, Type of exercise: Other - see comments (yardwork), Time (Minutes): 20, Frequency (Times/Week): 4, Weekly Exercise (Minutes/Week): 80, Intensity: Mild, Exercise  limited by: None identified  Goals    . Maintain life and travel          Starting today 11/18/2016 I will maintain my lifestyle here, but will try to travel back home to meditate.       Depression Screen PHQ 2/9 Scores 11/18/2016 11/17/2016 08/18/2016 03/17/2016 01/14/2016 05/17/2014 10/18/2013  PHQ - 2 Score 0 0 0 0 0 0 0    Fall Risk Fall Risk  11/18/2016 11/17/2016 08/18/2016 03/17/2016 01/14/2016  Falls in the past year? No No No No No    Cognitive Function: MMSE - Mini Mental State Exam 11/18/2016  Orientation to time 5  Orientation to Place 5  Registration 3  Attention/ Calculation 5  Recall 1  Language- name 2 objects 2  Language- repeat 1  Language- follow 3 step command 3  Language- read & follow direction 1  Write a sentence 1  Copy design 0  Total score 27        Screening Tests Health Maintenance  Topic Date Due  . Hepatitis C Screening  1945/12/17  . OPHTHALMOLOGY EXAM  11[birth year]02/1955  . FOOT EXAM  01/13/2017  . HEMOGLOBIN A1C  02/02/2017  . INFLUENZA VACCINE  03/04/2017  . PNA vac Low Risk Adult (2 of 2 - PPSV23) 04/21/2017  . MAMMOGRAM  10/21/2017  . TETANUS/TDAP  12/31/2024  . COLONOSCOPY  05/13/2025  . DEXA SCAN  Completed      Plan:    I have personally reviewed and addressed the Medicare Annual Wellness questionnaire and have noted the following in the patient's chart:  A. Medical and social history B. Use of alcohol, tobacco or illicit drugs  C. Current medications and supplements D. Functional ability and status E.  Nutritional status F.  Physical activity G. Advance directives H. List of other physicians I.  Hospitalizations, surgeries, and ER visits in previous 12 months J.  [name] to include hearing, vision, cognitive, depression L. Referrals and appointments - none  In addition, I have reviewed and discussed with patient certain preventive protocols, quality metrics, and best practice recommendations. A written personalized  care plan for preventive services as well as general preventive health recommendations were provided to patient.  See attached scanned questionnaire for additional information.   Signed,   Rich Reining, RN Nurse Health Advisor   I reviewed health advisors note, was available for consultation and agree with documentation and plan.  Carlos American. Harle Battiest  [facility] [phone] 8 am - 5 pm) [phone] (after hours)

## 2016-11-19 LAB — HEMOGLOBIN A1C
Hgb A1c MFr Bld: 7.8 % — ABNORMAL HIGH (ref <5.7)
Mean Plasma Glucose: 177 mg/dL

## 2016-11-20 ENCOUNTER — Ambulatory Visit: Payer: Medicare Other | Admitting: Internal Medicine

## 2016-11-24 ENCOUNTER — Ambulatory Visit: Payer: Medicare Other | Admitting: Internal Medicine

## 2016-11-30 ENCOUNTER — Encounter: Payer: Self-pay | Admitting: Internal Medicine

## 2016-12-15 ENCOUNTER — Ambulatory Visit (INDEPENDENT_AMBULATORY_CARE_PROVIDER_SITE_OTHER): Payer: Medicare Other | Admitting: Internal Medicine

## 2016-12-15 ENCOUNTER — Encounter: Payer: Self-pay | Admitting: Internal Medicine

## 2016-12-15 VITALS — BP 110/70 | HR 77 | Temp 97.8°F | Resp 12 | Ht 62.0 in | Wt 127.0 lb

## 2016-12-15 DIAGNOSIS — S46001D Unspecified injury of muscle(s) and tendon(s) of the rotator cuff of right shoulder, subsequent encounter: Secondary | ICD-10-CM | POA: Diagnosis not present

## 2016-12-15 DIAGNOSIS — I10 Essential (primary) hypertension: Secondary | ICD-10-CM

## 2016-12-15 DIAGNOSIS — E119 Type 2 diabetes mellitus without complications: Secondary | ICD-10-CM

## 2016-12-15 DIAGNOSIS — E782 Mixed hyperlipidemia: Secondary | ICD-10-CM | POA: Diagnosis not present

## 2016-12-15 MED ORDER — PRAVASTATIN SODIUM 80 MG PO TABS: 80.0000 mg | ORAL_TABLET | Freq: Every day | ORAL | 3 refills | Status: DC

## 2016-12-15 MED ORDER — EMPAGLIFLOZIN 25 MG PO TABS: 25.0000 mg | ORAL_TABLET | Freq: Every day | ORAL | 3 refills | Status: DC

## 2016-12-15 NOTE — Patient Instructions (Addendum)
Stop aleve.   You may use tylenol ES 500mg  up to 6 tablets maximum per day instead.   Call me if you want to start PT or get the MRI done. Work on lowering your sugar with improved diet.  If your hba1c is still high next time, we will need to add another pill.

## 2016-12-15 NOTE — Progress Notes (Signed)
Location:  [facility] clinic Provider:  Garek Schuneman L. Mariea Clonts, D.O., C.M.D.  Code Status: full code Goals of Care:  Advanced Directives 11/18/2016  Does Patient Have a Medical Advance Directive? No  Would patient like information on creating a medical advance directive? Yes (MAU/Ambulatory/Procedural Areas - Information given)    Chief Complaint  Patient presents with  . Medical Management of Chronic Issues    Medical Management of Diabetes, Hypertension and Cholesterol. Patient c/o right should pain x 1 month, no known injury.   . Medication Refill    Renew Jardiance & Pravastatin #90  . Health Maintenance    Will get DM eye exam this week, refused shingles vaccine     HPI: Patient is a 71 y.o. female seen today for medical management of chronic diseases.    She is still having the right shoulder.  Better than it was.  Still taking aleve daily.  Discussed risks of long term use.  Is ok with putting the food into the microwave up high above.  Does not want PT or MRI right now.  Says she is improving.  DMII:  some days it's low normal, other days 120, sometimes 110.  Sometimes eating very late at night.  Wonders if that's it--she is going to try to eat earlier in the evening.  No true exercise, but chases after two grandbabies.  Has eye appt this week.  Discussed adding actos if she does not have improvement by next visit.  Hyperlipidemia:  Triglycerides trending upward.  Admits it's her diet selections.  She's also been less active since hurting her right shoulder lifting an outside pot for her garden.  Past Medical History:  Diagnosis Date  . Disorder of bone and cartilage, unspecified   . Dizziness and giddiness   . Hypercalcemia   . Hyperlipidemia   . Hypertension   . Hypopotassemia   . Other malaise and fatigue   . Other specified disease of white blood cells   . Special screening for malignant neoplasms, colon   . Type II or unspecified type diabetes mellitus without mention of  complication, not stated as uncontrolled   . Unspecified vitamin D deficiency     Past Surgical History:  Procedure Laterality Date  . COLONOSCOPY  05/14/15   Dr. Collene Mares  . FRACTURE SURGERY  2001   Tibia & fibula  . TONSILLECTOMY  1996    Allergies  Allergen Reactions  . Metformin And Related Diarrhea    Allergies as of 12/15/2016      Reactions   Metformin And Related Diarrhea      Medication List       Accurate as of 12/15/16  1:11 PM. Always use your most recent med list.          benazepril-hydrochlorthiazide 20-12.5 MG tablet Commonly known as:  LOTENSIN HCT take 1 tablet by mouth once daily   Calcium Carbonate-Vitamin D 600-400 MG-UNIT tablet Take 1 tablet by mouth daily.   empagliflozin 25 MG Tabs tablet Commonly known as:  JARDIANCE Take 25 mg by mouth daily.   freestyle lancets TEST as directed   glucose blood test strip Commonly known as:  FREESTYLE LITE Use as instructed to test blood sugar once daily dx E11.9   linagliptin 5 MG Tabs tablet Commonly known as:  TRADJENTA Take 1 tablet (5 mg total) by mouth daily.   omeprazole 40 MG capsule Commonly known as:  PRILOSEC Take 40 mg by mouth daily. Reported on 09/10/2015   pravastatin 80  MG tablet Commonly known as:  PRAVACHOL take 1 tablet by mouth once daily       Review of Systems:  Review of Systems  Constitutional: Negative for chills and fever.  HENT: Negative for congestion and hearing loss.   Eyes: Negative for blurred vision.       Glasses, upcoming eye exam this week  Respiratory: Negative for cough and shortness of breath.   Cardiovascular: Negative for chest pain, palpitations and leg swelling.  Gastrointestinal: Negative for abdominal pain, blood in stool, constipation and melena.  Genitourinary: Negative for dysuria.  Musculoskeletal: Positive for joint pain. Negative for falls.  Skin: Negative for itching and rash.  Neurological: Negative for dizziness and loss of  consciousness.  Psychiatric/Behavioral: Negative for depression and memory loss.    Health Maintenance  Topic Date Due  . OPHTHALMOLOGY EXAM  03/18/2017 (Originally 11/02/1955)  . Hepatitis C Screening  08/04/2017 (Originally 03-Jul-1946)  . FOOT EXAM  01/13/2017  . INFLUENZA VACCINE  03/04/2017  . PNA vac Low Risk Adult (2 of 2 - PPSV23) 04/21/2017  . HEMOGLOBIN A1C  05/20/2017  . MAMMOGRAM  10/21/2017  . TETANUS/TDAP  12/31/2024  . COLONOSCOPY  05/13/2025  . DEXA SCAN  Completed    Physical Exam: Vitals:   12/15/16 1259  BP: 110/70  Pulse: 77  Resp: 12  Temp: 97.8 F (36.6 C)  TempSrc: Oral  Weight: 127 lb (57.6 kg)  Height: 5\' 2"  (1.575 m)   Body mass index is 23.23 kg/m. Physical Exam  Constitutional: She is oriented to person, place, and time. She appears well-developed and well-nourished. No distress.  Cardiovascular: Normal rate, regular rhythm, normal heart sounds and intact distal pulses.   Pulmonary/Chest: Effort normal and breath sounds normal. No respiratory distress.  Abdominal: Bowel sounds are normal.  Musculoskeletal: Normal range of motion. She exhibits tenderness.  Right posterior shoulder, but not down arm at this time  Neurological: She is alert and oriented to person, place, and time.  Skin: Skin is warm and dry.  Psychiatric: She has a normal mood and affect.    Labs reviewed: Basic Metabolic Panel:  Recent Labs  01/07/16 0854 04/17/16 0858 08/18/16 1118  NA 140 140  --   K 4.3 4.1  --   CL 100 105  --   CO2 22 26  --   GLUCOSE 141* 127*  --   BUN 17 18  --   CREATININE 0.84 0.76  --   CALCIUM 9.5 9.4  --   TSH  --   --  0.73   Liver Function Tests:  Recent Labs  01/07/16 0854  AST 12  ALT 19  ALKPHOS 81  BILITOT 0.2  PROT 7.0  ALBUMIN 4.3   No results for input(s): LIPASE, AMYLASE in the last 8760 hours. No results for input(s): AMMONIA in the last 8760 hours. CBC: No results for input(s): WBC, NEUTROABS, HGB, HCT, MCV,  PLT in the last 8760 hours. Lipid Panel:  Recent Labs  04/17/16 0858 08/05/16 1028 11/18/16 0831  CHOL 196 187 162  HDL 61 57 57  LDLCALC 103 97 46  TRIG 158* 163* 296*  CHOLHDL 3.2 3.3 2.8   Lab Results  Component Value Date   HGBA1C 7.8 (H) 11/18/2016    Assessment/Plan 1. Type 2 diabetes mellitus without complication, without long-term current use of insulin (HCC) - cont to work on diet, wanted to wait until next visit to add actos if needed  -thinks she can improve her  sugars w/o more meds - also continue tradjenta therapy, pravastatin - empagliflozin (JARDIANCE) 25 MG TABS tablet; Take 25 mg by mouth daily.  Dispense: 90 tablet; Refill: 3 - Hemoglobin A1c; Future - Lipid panel; Future - Basic metabolic panel; Future  2. 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 Essential hypertension -bp at goal with current therapy, cont ace/hctz combo - Basic metabolic panel; Future  4. Mixed hyperlipidemia -lipids improved with pravachol initially, but TG trended up last time--she wants to work on her dietary changes for next time and will reassess before next appt--if still high, change to lipitor - pravastatin (PRAVACHOL) 80 MG tablet; Take 1 tablet (80 mg total) by mouth daily.  Dispense: 90 tablet; Refill: 3 - Lipid panel; Future  Labs/tests ordered:   Orders Placed This Encounter  Procedures  . Hemoglobin A1c    Standing Status:   Future    Standing Expiration Date:   06/17/2017  . Lipid panel    Standing Status:   Future    Standing Expiration  Date:   06/17/2017    Order Specific Question:   Has the patient fasted?    Answer:   Yes  . Basic metabolic panel    Standing Status:   Future    Standing Expiration Date:   06/17/2017    Order Specific Question:   Has the patient fasted?    Answer:   Yes    Next appt:  3 mos med mgt, labs before  [place] Blain Hunsucker, D.O. [facility] [address], [place] [address] Cell Phone (Mon-Fri 8am-5pm):  [phone] On Call:  [phone] & follow prompts after 5pm & weekends Office Phone:  [phone] Office Fax:  [phone]

## 2017-01-12 ENCOUNTER — Ambulatory Visit (INDEPENDENT_AMBULATORY_CARE_PROVIDER_SITE_OTHER): Payer: Medicare Other | Admitting: Internal Medicine

## 2017-01-12 ENCOUNTER — Encounter: Payer: Self-pay | Admitting: Internal Medicine

## 2017-01-12 VITALS — BP 110/60 | HR 75 | Temp 98.9°F | Wt 127.0 lb

## 2017-01-12 DIAGNOSIS — S4991XD Unspecified injury of right shoulder and upper arm, subsequent encounter: Secondary | ICD-10-CM

## 2017-01-12 NOTE — Progress Notes (Signed)
Location:  [facility] clinic Provider: Karin Griffith L. Mariea Clonts, D.O., C.M.D.  Code Status: full code Goals of Care:  Advanced Directives 11/18/2016  Does Patient Have a Medical Advance Directive? No  Would patient like information on creating a medical advance directive? Yes (MAU/Ambulatory/Procedural Areas - Information given)   Chief Complaint  Patient presents with  . Acute Visit    right arm pain x2 months    HPI: Patient is a 71 y.o. female seen today for an acute visit for right arm pain for 2 mos.  See 2 previous notes about this.  Last time I recommended PT and she was going to call back and let me know about it.  She had previously not wanted an MRI.  She now agrees to both MRI and PT.  She has not made any progress doing exercises on her own, pain continues to shoot down the front of her arm.  There is a notable discrepancy between her clavicles bilaterally.  Right more prominent.  Has discomfort and decreased ROM to rotate to attach bra behind her.    Past Medical History:  Diagnosis Date  . Disorder of bone and cartilage, unspecified   . Dizziness and giddiness   . Hypercalcemia   . Hyperlipidemia   . Hypertension   . Hypopotassemia   . Other malaise and fatigue   . Other specified disease of white blood cells   . Special screening for malignant neoplasms, colon   . Type II or unspecified type diabetes mellitus without mention of complication, not stated as uncontrolled   . Unspecified vitamin D deficiency     Past Surgical History:  Procedure Laterality Date  . COLONOSCOPY  05/14/15   Dr. Collene Mares  . FRACTURE SURGERY  2001   Tibia & fibula  . TONSILLECTOMY  1996    Allergies  Allergen Reactions  . Metformin And Related Diarrhea    Allergies as of 01/12/2017      Reactions   Metformin And Related Diarrhea      Medication List       Accurate as of 01/12/17  3:43 PM. Always use your most recent med list.          benazepril-hydrochlorthiazide 20-12.5 MG  tablet Commonly known as:  LOTENSIN HCT take 1 tablet by mouth once daily   Calcium Carbonate-Vitamin D 600-400 MG-UNIT tablet Take 1 tablet by mouth daily.   empagliflozin 25 MG Tabs tablet Commonly known as:  JARDIANCE Take 25 mg by mouth daily.   freestyle lancets TEST as directed   glucose blood test strip Commonly known as:  FREESTYLE LITE Use as instructed to test blood sugar once daily dx E11.9   linagliptin 5 MG Tabs tablet Commonly known as:  TRADJENTA Take 1 tablet (5 mg total) by mouth daily.   omeprazole 40 MG capsule Commonly known as:  PRILOSEC Take 40 mg by mouth daily. Reported on 09/10/2015   pravastatin 80 MG tablet Commonly known as:  PRAVACHOL Take 1 tablet (80 mg total) by mouth daily.       Review of Systems:  Review of Systems  Constitutional: Negative for chills, fever and malaise/fatigue.  HENT: Negative for congestion and hearing loss.   Eyes: Negative for blurred vision.  Respiratory: Negative for shortness of breath.   Cardiovascular: Negative for chest pain and palpitations.  Gastrointestinal: Negative for abdominal pain.  Genitourinary: Negative for dysuria.  Musculoskeletal: Positive for joint pain and myalgias. Negative for falls.  Neurological: Positive for tingling and sensory  change. Negative for dizziness and weakness.  Endo/Heme/Allergies:       Diabetes  Psychiatric/Behavioral: Negative for depression and memory loss. The patient is not nervous/anxious and does not have insomnia.     Health Maintenance  Topic Date Due  . OPHTHALMOLOGY EXAM  03/18/2017 (Originally 11/02/1955)  . Hepatitis C Screening  08/04/2017 (Originally [birth year])  . FOOT EXAM  01/13/2017  . INFLUENZA VACCINE  03/04/2017  . PNA vac Low Risk Adult (2 of 2 - PPSV23) 04/21/2017  . HEMOGLOBIN A1C  05/20/2017  . MAMMOGRAM  10/21/2017  . TETANUS/TDAP  12/31/2024  . COLONOSCOPY  05/13/2025  . DEXA SCAN  Completed    Physical Exam: Vitals:   01/12/17 1535   BP: 110/60  Pulse: 75  Temp: 98.9 F (37.2 C)  TempSrc: Oral  SpO2: 99%  Weight: 127 lb (57.6 kg)   Body mass index is 23.23 kg/m. Physical Exam  Constitutional: She is oriented to person, place, and time. She appears well-developed and well-nourished. No distress.  Cardiovascular: Normal rate, regular rhythm, normal heart sounds and intact distal pulses.   Pulmonary/Chest: Effort normal and breath sounds normal. No respiratory distress.  Musculoskeletal: She exhibits tenderness.  Down anterior arm, discrepancy between clavicular prominence, decreased ability to internally rotate right arm to reach behind her  Neurological: She is alert and oriented to person, place, and time.  Skin: Skin is warm and dry. Capillary refill takes less than 2 seconds.  Psychiatric: She has a normal mood and affect.    Labs reviewed: Basic Metabolic Panel:  Recent Labs  04/17/16 0858 08/18/16 1118  NA 140  --   K 4.1  --   CL 105  --   CO2 26  --   GLUCOSE 127*  --   BUN 18  --   CREATININE 0.76  --   CALCIUM 9.4  --   TSH  --  0.73   Liver Function Tests: No results for input(s): AST, ALT, ALKPHOS, BILITOT, PROT, ALBUMIN in the last 8760 hours. No results for input(s): LIPASE, AMYLASE in the last 8760 hours. No results for input(s): AMMONIA in the last 8760 hours. CBC: No results for input(s): WBC, NEUTROABS, HGB, HCT, MCV, PLT in the last 8760 hours. Lipid Panel:  Recent Labs  04/17/16 0858 08/05/16 1028 11/18/16 0831  CHOL 196 187 162  HDL 61 57 57  LDLCALC 103 97 46  TRIG 158* 163* 296*  CHOLHDL 3.2 3.3 2.8   Lab Results  Component Value Date   HGBA1C 7.8 (H) 11/18/2016    Assessment/Plan 1. Injury of right shoulder, subsequent encounter - suspect at least partial tear in rotator cuff tendon given persistent loss of ROM and pain that has not resolved over several weeks despite temporary nsaids, tylenol, home exercise -she now agrees to further imaging and outpatient  PT--has difficulty with transportation and watches grandbabies so scheduling is challenging--may benefit from some instruction on 1-2 sessions and then exercise program for home - MR Shoulder Right Wo Contrast; Future - Ambulatory referral to Physical Therapy  Labs/tests ordered:   Orders Placed This Encounter  Procedures  . MR Shoulder Right Wo Contrast    Epic order Wt 127/HT5'2/yes claus/no needs/no metal in eyes or body/no hx of sx to rt shoulder,brain,heart,eyes or ears/no implants/ins-mc/medicaid Pts husband w/epic order-sb    Standing Status:   Future    Standing Expiration Date:   03/14/2018    Order Specific Question:   Reason for Exam (SYMPTOM  OR DIAGNOSIS  REQUIRED)    Answer:   right anterior shoulder pain since lifting heavy plant, shoots down arm    Order Specific Question:   What is the patient's sedation requirement?    Answer:   No Sedation    Order Specific Question:   Does the patient have a pacemaker or implanted devices?    Answer:   No    Comments:   right leg has rod and screws but nothing in upper body    Order Specific Question:   Preferred imaging location?    Answer:   [facility] (table limit-550lbs)    Order Specific Question:   Radiology Contrast Protocol - do NOT remove file path    Answer:   [email or web address]  . Ambulatory referral to Physical Therapy    Referral Priority:   Routine    Referral Type:   Physical Medicine    Referral Reason:   Specialty Services Required    Requested Specialty:   Physical Therapy    Number of Visits Requested:   1    Next appt:  03/16/2017  Nihar Klus L. Laticia Vannostrand, D.O. [facility] [address], [place] [address] Cell Phone (Mon-Fri 8am-5pm):  [phone] On Call:  [phone] & follow prompts after 5pm & weekends Office Phone:  [phone] Office Fax:  [phone]

## 2017-02-02 ENCOUNTER — Ambulatory Visit
Admission: RE | Admit: 2017-02-02 | Discharge: 2017-02-02 | Disposition: A | Discharge status: Home / Self Care | Payer: Medicare Other | Source: Ambulatory Visit | Attending: Internal Medicine | Admitting: Internal Medicine

## 2017-02-02 DIAGNOSIS — M19011 Primary osteoarthritis, right shoulder: Secondary | ICD-10-CM | POA: Diagnosis not present

## 2017-02-02 DIAGNOSIS — S4991XD Unspecified injury of right shoulder and upper arm, subsequent encounter: Secondary | ICD-10-CM

## 2017-02-06 ENCOUNTER — Other Ambulatory Visit: Payer: Self-pay | Admitting: Internal Medicine

## 2017-02-06 ENCOUNTER — Encounter: Payer: Self-pay | Admitting: *Deleted

## 2017-02-06 DIAGNOSIS — S46001D Unspecified injury of muscle(s) and tendon(s) of the rotator cuff of right shoulder, subsequent encounter: Secondary | ICD-10-CM

## 2017-02-16 ENCOUNTER — Encounter (INDEPENDENT_AMBULATORY_CARE_PROVIDER_SITE_OTHER): Payer: Self-pay | Admitting: Orthopedic Surgery

## 2017-02-16 ENCOUNTER — Ambulatory Visit (INDEPENDENT_AMBULATORY_CARE_PROVIDER_SITE_OTHER): Payer: Medicare Other

## 2017-02-16 ENCOUNTER — Ambulatory Visit (INDEPENDENT_AMBULATORY_CARE_PROVIDER_SITE_OTHER): Payer: Medicare Other | Admitting: Orthopedic Surgery

## 2017-02-16 DIAGNOSIS — M542 Cervicalgia: Secondary | ICD-10-CM

## 2017-02-16 DIAGNOSIS — M25511 Pain in right shoulder: Secondary | ICD-10-CM | POA: Diagnosis not present

## 2017-02-16 NOTE — Progress Notes (Signed)
Office Visit Note   Patient: Joann Kennedy           Date of Birth: 1946/06/29           MRN: [id number] Visit Date: 02/16/2017 Requested by: Gayland Curry, DO [place], [place] [address] PCP: Gayland Curry, DO  Subjective: Chief Complaint  Patient presents with  . Right Shoulder - Pain    HPI: Patient is a 71 year old right-hand-dominant female with three-month history of right shoulder pain.  Denies any history of injury.  States that the pain is constant.  Does radiate up into the neck as well as down to the elbow.  She does report some paresthesias but occasionally.  We'll wake her from pain most nights.  Takes Aleve with some relief and is now trying Tylenol.  She has had an MRI scan which is reviewed.  Does show 10 mm super status tear with protraction but no muscle atrophy.  Biceps tendon also been ruptured.  She is currently retired.  She looks after grandchildren in [place].  This is difficult because it involves lifting the children at times.  She also describes neck pain associated with this arm pain which is been going on about 2-3 months as well.  Denies any left-sided symptoms.              ROS: All systems reviewed are negative as they relate to the chief complaint within the history of present illness.  Patient denies  fevers or chills.   Assessment & Plan: Visit Diagnoses:  1. Right shoulder pain, unspecified chronicity   2. Neck pain     Plan: Impression is right shoulder rotator cuff tear without evidence of frozen shoulder or significant weakness on exam.  Rotator cuff tear is fixable at this time.  I think in general about 80% of her symptoms are likely coming from the shoulder.  The neck may also be at play.  Only mild degenerative changes noted in the neck but she does have some radicular component of her symptoms.  Plan is MRI cervical spine to evaluate for right-sided radiculopathy.  Discussed with her at length today operative and nonoperative  treatment options for the shoulder rotator cuff tear.  Specifically the rehabilitation involved with that rotator cuff surgery is discussed at length.  She is given a consider her options and when we see her back after her MRI scan of the cervical spine we can discuss further about whether not she wants to get her shoulder fixed.  Follow-Up Instructions: Return for after MRI.   Orders:  Orders Placed This Encounter  Procedures  . XR Cervical Spine 2 or 3 views  . MR Cervical Spine w/o contrast   No orders of the defined types were placed in this encounter.     Procedures: No procedures performed   Clinical Data: No additional findings.  Objective: Vital Signs: There were no vitals taken for this visit.  Physical Exam:   Constitutional: Patient appears well-developed HEENT:  Head: Normocephalic Eyes:EOM are normal Neck: Normal range of motion Cardiovascular: Normal rate Pulmonary/chest: Effort normal Neurologic: Patient is alert Skin: Skin is warm Psychiatric: Patient has normal mood and affect    Ortho Exam: Orthopedic exam demonstrates good cervical spine range of motion with some reproduction of symptoms with rotation of the head to the right.  Patient has 5 out of 5 grip EPL FPL interosseous wrist flexion-extension biceps triceps.for strength with palpable radial pulses.  She does have coarse  grinding and crepitus on the right shoulder with passive range of motion but not on the left.  She has full external rotation to about 60 bilaterally.  No acromioclavicular joint tenderness on the right or left hand side.  Rotator cuff strength is also pretty good 5+ out of 5 to infraspinatus supraspinatus and subscap testing on the right left-hand side.  More painful on the right than the left however.  Specialty Comments:  No specialty comments available.  Imaging: Xr Cervical Spine 2 Or 3 Views  Result Date: 02/16/2017 AP lateral cervical spine reviewed.  Minimal to no  degenerative changes noted in the disc spaces.  Mild lower cervical spine facet arthritis is present at 2 levels.  1-2 mm of spondylolisthesis also present in the lower cervical spine.  Overall less than expected degenerative changes noted in the cervical spine of this patient    PMFS History: Patient Active Problem List   Diagnosis Date Noted  . Type 2 diabetes mellitus without complication, without long-term current use of insulin ([place]) 01/14/2016  . Essential hypertension 01/14/2016  . Hyperlipidemia 01/14/2016  . Estrogen deficiency 05/17/2014  . Encounter for screening mammogram for breast cancer 05/17/2014  . Colon cancer screening 05/17/2014  . Hyperlipidemia LDL goal <100 05/17/2014  . Breast cancer screening 05/17/2014  . IBS (irritable bowel syndrome) 01/26/2013  . Other and unspecified hyperlipidemia 01/26/2013  . Vitamin D deficiency 01/05/2013  . Other malaise and fatigue 01/05/2013   Past Medical History:  Diagnosis Date  . Disorder of bone and cartilage, unspecified   . Dizziness and giddiness   . Hypercalcemia   . Hyperlipidemia   . Hypertension   . Hypopotassemia   . Other malaise and fatigue   . Other specified disease of white blood cells   . Special screening for malignant neoplasms, colon   . Type II or unspecified type diabetes mellitus without mention of complication, not stated as uncontrolled   . Unspecified vitamin D deficiency     Family History  Problem Relation Age of Onset  . Diabetes Mother   . Heart disease Mother   . Heart disease Brother     Past Surgical History:  Procedure Laterality Date  . COLONOSCOPY  05/14/15   Dr. Collene Mares  . FRACTURE SURGERY  2001   Tibia & fibula  . TONSILLECTOMY  1996   Social History   Occupational History  . Not on file.   Social History Main Topics  . Smoking status: Never Smoker  . Smokeless tobacco: Never Used  . Alcohol use No  . Drug use: No  . Sexual activity: Not on file

## 2017-03-04 ENCOUNTER — Ambulatory Visit
Admission: RE | Admit: 2017-03-04 | Discharge: 2017-03-04 | Disposition: A | Discharge status: Home / Self Care | Payer: Medicare Other | Source: Ambulatory Visit | Attending: Orthopedic Surgery | Admitting: Orthopedic Surgery

## 2017-03-04 ENCOUNTER — Encounter: Payer: Self-pay | Admitting: Physical Therapy

## 2017-03-04 ENCOUNTER — Ambulatory Visit: Payer: Medicare Other | Attending: Internal Medicine | Admitting: Physical Therapy

## 2017-03-04 DIAGNOSIS — M542 Cervicalgia: Secondary | ICD-10-CM | POA: Diagnosis not present

## 2017-03-04 DIAGNOSIS — M25511 Pain in right shoulder: Secondary | ICD-10-CM | POA: Diagnosis not present

## 2017-03-04 DIAGNOSIS — M6281 Muscle weakness (generalized): Secondary | ICD-10-CM

## 2017-03-04 DIAGNOSIS — M50223 Other cervical disc displacement at C6-C7 level: Secondary | ICD-10-CM | POA: Diagnosis not present

## 2017-03-04 DIAGNOSIS — M50222 Other cervical disc displacement at C5-C6 level: Secondary | ICD-10-CM | POA: Diagnosis not present

## 2017-03-04 NOTE — Therapy (Signed)
[facility] [address], [place], [place], [address] Phone: [phone]   Fax:  [phone]  Physical Therapy Evaluation  Patient Details  Name: Joann Kennedy MRN: [id number] Date of Birth: 27-Apr-1946 Referring Provider: Dr. Hollace Kinnier  Encounter Date: 03/04/2017      PT End of Session - 03/04/17 1232    Visit Number 1   Number of Visits 10   Date for PT Re-Evaluation 04/29/17   Authorization Type medicare g-code on 10th visit   PT Start Time 1110  came late   PT Stop Time 1145   PT Time Calculation (min) 35 min   Activity Tolerance Patient tolerated treatment well   Behavior During Therapy [facility] for tasks assessed/performed      Past Medical History:  Diagnosis Date  . Disorder of bone and cartilage, unspecified   . Dizziness and giddiness   . Hypercalcemia   . Hyperlipidemia   . Hypertension   . Hypopotassemia   . Other malaise and fatigue   . Other specified disease of white blood cells   . Special screening for malignant neoplasms, colon   . Type II or unspecified type diabetes mellitus without mention of complication, not stated as uncontrolled   . Unspecified vitamin D deficiency     Past Surgical History:  Procedure Laterality Date  . COLONOSCOPY  05/14/15   Dr. Collene Mares  . FRACTURE SURGERY  2001   Tibia & fibula  . TONSILLECTOMY  1996    There were no vitals filed for this visit.       Subjective Assessment - 03/04/17 1120    Subjective Right shoulder and neck has pain for the past 6 months and it is increasing.  No injury. Saw a Dr. Marlou Sa and suggest surgery but patient want to do therapy.    Diagnostic tests MRI shows significant tears of biceps and supraspinatus tendons; arthritis of the shoulder joint.   Patient Stated Goals reduce pain   Currently in Pain? Yes   Pain Score 6   low is 2/10   Pain Location Shoulder   Pain Orientation Right   Pain Descriptors / Indicators Constant    Pain Type Acute pain   Pain Onset More than a month ago   Pain Frequency Constant   Aggravating Factors  cutting with a knife. lifting, sleeping on right side, putting a pullover top on and bra, washing her back   Pain Relieving Factors arm at side   Multiple Pain Sites No            [facility] PT Assessment - 03/04/17 0001      Assessment   Medical Diagnosis S49.91XD Injury of right shoulder, subsequent encounter   Referring Provider Dr. Hollace Kinnier   Onset Date/Surgical Date 09/04/16   Hand Dominance Right   Prior Therapy none     Precautions   Precautions None;Other (comment)   Precaution Comments significant tear of the right biceps and supraspinatus tendon     Restrictions   Weight Bearing Restrictions No     Balance Screen   Has the patient fallen in the past 6 months No   Has the patient had a decrease in activity level because of a fear of falling?  No   Is the patient reluctant to leave their home because of a fear of falling?  No     Home Ecologist residence     Prior Function   Level of Independence  Independent   Vocation Retired   Leisure holding grandchild     Cognition   Overall Cognitive Status Within Functional Limits for tasks assessed     ROM / Strength   AROM / PROM / Strength AROM;PROM;Strength     AROM   Overall AROM Comments Cervical ROM is full   Right Shoulder Flexion 162 Degrees   Right Shoulder ABduction 150 Degrees     PROM   Overall PROM  Within functional limits for tasks performed     Strength   Right Shoulder Flexion 4/5   Right Shoulder Extension 4/5   Right Shoulder ABduction 3/5   Right Shoulder Internal Rotation 3+/5   Right Shoulder External Rotation 3/5     Palpation   Palpation comment tenderness located in cervical paraspinals, right RTC            Objective measurements completed on examination: See above findings.                    PT Short Term Goals -  03/04/17 1235      PT SHORT TERM GOAL #1   Title independent with initial HEP   Time 4   Period Weeks   Status New   Target Date 04/01/17     PT SHORT TERM GOAL #2   Title able to put her bra on with 25% greater ease due to reduction in pain   Time 4   Period Weeks   Status New   Target Date 04/01/17     PT SHORT TERM GOAL #3   Title able to put a pull over top with 25% greater ease due to decreased pain   Time 4   Period Weeks   Status New   Target Date 04/01/17     PT SHORT TERM GOAL #4   Title understand ways to sleep with right arm propped up and sitting with right arm propped up   Time 4   Period Weeks   Status New   Target Date 04/01/17           PT Long Term Goals - 03/04/17 1323      PT LONG TERM GOAL #1   Title indepedent with HEP   Time 8   Period Weeks   Status New   Target Date 04/29/17     PT LONG TERM GOAL #2   Title ability to take care of her grandchildren with 50% greater ease and minimal pain due to increased strength   Time 8   Period Weeks   Status New   Target Date 04/29/17     PT LONG TERM GOAL #3   Title ability to dress without difficulty due to pain miminal   Time 8   Period Weeks   Status New   Target Date 04/29/17     PT LONG TERM GOAL #4   Title able to lift 5 pounds with 2 hands due to increased right shoulder strength >/= 4/5   Time 8   Period Weeks   Status New   Target Date 04/29/17     PT LONG TERM GOAL #5   Title FOTO score </= 34% limitation   Time 8   Period Weeks   Status New   Target Date 04/29/17                Plan - 03/04/17 1227    Clinical Impression Statement Patient is a 71 year old female with right shoulder  and neck pain that started suddenly 6 months ago.  Patient reports her constant pain is 2-6/10.  Patient has increased pain with using her right arm, dressing, lifting and laying on right side.  Right shoulder has full AROM and PROM but is weak.  Cervical ROM is full. Palpable  tenderness located in right cervical paraspinals, right rotator cuff, and right upper trapezius.  Patient watches her 57 month old and 67 year old grandchild.  MRI showed significant  tear in supraspinatus and biceps tendon.  Patient will benefit from skilled therapy to reduce her pain, improve right shoulder strength and function.    History and Personal Factors relevant to plan of care: significant tear in right biceps and supraspinatus tendon   Clinical Presentation Stable   Clinical Presentation due to: stable condtion   Clinical Decision Making Low   Rehab Potential Excellent   PT Frequency 2x / week   PT Duration 8 weeks   PT Treatment/Interventions Electrical Stimulation;Cryotherapy;Iontophoresis 4mg /ml Dexamethasone;Ultrasound;Moist Heat;Therapeutic activities;Therapeutic exercise;Neuromuscular re-education;Patient/family education;Passive range of motion;Manual techniques;Dry needling;Energy conservation;Taping   PT Next Visit Plan stabilization exercises to right shoulder; soft tissue work to right shoulder and cervical; scapula stabilization; modalities as needed   PT Home Exercise Plan progress as needed   Consulted and Agree with Plan of Care Patient      Patient will benefit from skilled therapeutic intervention in order to improve the following deficits and impairments:  Increased muscle spasms, Decreased endurance, Decreased activity tolerance, Pain, Decreased strength  Visit Diagnosis: Cervicalgia - Plan: PT plan of care cert/re-cert  Acute pain of right shoulder - Plan: PT plan of care cert/re-cert  Muscle weakness (generalized) - Plan: PT plan of care cert/re-cert      G-Codes - 26/33/35 1327    Functional Assessment Tool Used (Outpatient Only) FOTO score is 45% limitation  goal is 34% limitation   Functional Limitation Mobility: Walking and moving around   Mobility: Walking and Moving Around Current Status (K5625) At least 40 percent but less than 60 percent impaired,  limited or restricted   Mobility: Walking and Moving Around Goal Status (W3893) At least 20 percent but less than 40 percent impaired, limited or restricted       Problem List Patient Active Problem List   Diagnosis Date Noted  . Type 2 diabetes mellitus without complication, without long-term current use of insulin ([place]) 01/14/2016  . Essential hypertension 01/14/2016  . Hyperlipidemia 01/14/2016  . Estrogen deficiency 05/17/2014  . Encounter for screening mammogram for breast cancer 05/17/2014  . Colon cancer screening 05/17/2014  . Hyperlipidemia LDL goal <100 05/17/2014  . Breast cancer screening 05/17/2014  . IBS (irritable bowel syndrome) 01/26/2013  . Other and unspecified hyperlipidemia 01/26/2013  . Vitamin D deficiency 01/05/2013  . Other malaise and fatigue 01/05/2013    Earlie Counts, PT 03/04/17 1:30 PM   [facility] [address] [place], [place], [address] Phone: [phone]   Fax:  [phone]  Name: Joann Kennedy MRN: [id number] Date of Birth: 04-Nov-1945

## 2017-03-11 ENCOUNTER — Ambulatory Visit: Payer: Medicare Other

## 2017-03-11 DIAGNOSIS — M542 Cervicalgia: Secondary | ICD-10-CM

## 2017-03-11 DIAGNOSIS — M6281 Muscle weakness (generalized): Secondary | ICD-10-CM

## 2017-03-11 DIAGNOSIS — M25511 Pain in right shoulder: Secondary | ICD-10-CM

## 2017-03-11 NOTE — Therapy (Signed)
[facility] [address], [place], [place], [address] Phone: [phone]   Fax:  [phone]  Physical Therapy Treatment  Patient Details  Name: Joann Kennedy MRN: [id number] Date of Birth: Dec 19, 1945 Referring Provider: Dr. Hollace Kinnier  Encounter Date: 03/11/2017      PT End of Session - 03/11/17 0943    Visit Number 2   Number of Visits 10   Date for PT Re-Evaluation 04/29/17   Authorization Type medicare g-code on 10th visit   PT Start Time 0939  pt. arrived late    PT Stop Time 1032   PT Time Calculation (min) 53 min   Activity Tolerance Patient tolerated treatment well   Behavior During Therapy [facility] for tasks assessed/performed      Past Medical History:  Diagnosis Date  . Disorder of bone and cartilage, unspecified   . Dizziness and giddiness   . Hypercalcemia   . Hyperlipidemia   . Hypertension   . Hypopotassemia   . Other malaise and fatigue   . Other specified disease of white blood cells   . Special screening for malignant neoplasms, colon   . Type II or unspecified type diabetes mellitus without mention of complication, not stated as uncontrolled   . Unspecified vitamin D deficiency     Past Surgical History:  Procedure Laterality Date  . COLONOSCOPY  05/14/15   Dr. Collene Mares  . FRACTURE SURGERY  2001   Tibia & fibula  . TONSILLECTOMY  1996    There were no vitals filed for this visit.      Subjective Assessment - 03/11/17 0941    Subjective Expects to get results from cervical MRI this upcoming week.     Patient Stated Goals reduce pain   Currently in Pain? Yes   Pain Score 5    Pain Location Shoulder   Pain Orientation Right   Pain Descriptors / Indicators Constant   Pain Onset More than a month ago   Pain Frequency Constant   Aggravating Factors  sleeping on R side    Pain Relieving Factors rest   Multiple Pain Sites No                         [facility] Adult PT  Treatment/Exercise - 03/11/17 1207      Shoulder Exercises: Supine   External Rotation Both;10 reps   External Rotation Limitations wand; full AROM pain free    Flexion Both;10 reps   Flexion Limitations wand; full AROM; slight R shoulder pain on return      Shoulder Exercises: Standing   External Rotation Right;10 reps;Theraband   Internal Rotation Right;15 reps;Theraband   Theraband Level (Shoulder Internal Rotation) Level 1 (Yellow)     Moist Heat Therapy   Number Minutes Moist Heat 15 Minutes   Moist Heat Location Cervical     Electrical Stimulation   Electrical Stimulation Location cervical    Electrical Stimulation Action IFC   Electrical Stimulation Parameters 80-150Hz , intensity to pt. tolerance, 15'    Electrical Stimulation Goals Pain     Manual Therapy   Manual Therapy Soft tissue mobilization;Myofascial release;Passive ROM   Manual therapy comments Supine   Soft tissue mobilization STM to B UT, Levator scap., B cervical paraspinals, posterior RTC   Myofascial Release TPR to R UT (near superior angle of scapula); mod ttp here   Passive ROM Cervical PROM all directions with gentle R UT, Levator scap. stretch  Neck Exercises: Stretches   Upper Trapezius Stretch 2 reps;30 seconds   Upper Trapezius Stretch Limitations R only    Levator Stretch 2 reps;30 seconds   Levator Stretch Limitations R only                 PT Education - 03/11/17 1033    Education provided Yes   Education Details Ut stretch, Levator scap. stretch   Person(s) Educated Patient   Methods Explanation;Demonstration;Verbal cues;Handout   Comprehension Verbalized understanding;Returned demonstration;Verbal cues required;Need further instruction          PT Short Term Goals - 03/11/17 0951      PT SHORT TERM GOAL #1   Title independent with initial HEP   Time 4   Period Weeks   Status On-going     PT SHORT TERM GOAL #2   Title able to put her bra on with 25% greater ease due  to reduction in pain   Time 4   Period Weeks   Status On-going     PT SHORT TERM GOAL #3   Title able to put a pull over top with 25% greater ease due to decreased pain   Time 4   Period Weeks   Status On-going     PT SHORT TERM GOAL #4   Title understand ways to sleep with right arm propped up and sitting with right arm propped up   Time 4   Period Weeks   Status New           PT Long Term Goals - 03/11/17 1215      PT LONG TERM GOAL #1   Title indepedent with HEP   Time 8   Period Weeks   Status On-going     PT LONG TERM GOAL #2   Title ability to take care of her grandchildren with 50% greater ease and minimal pain due to increased strength   Time 8   Period Weeks   Status On-going     PT LONG TERM GOAL #3   Title ability to dress without difficulty due to pain miminal   Time 8   Period Weeks   Status On-going     PT LONG TERM GOAL #4   Title able to lift 5 pounds with 2 hands due to increased right shoulder strength >/= 4/5   Time 8   Period Weeks   Status On-going     PT LONG TERM GOAL #5   Title FOTO score </= 34% limitation   Time 8   Period Weeks   Status On-going               Plan - 03/11/17 6712    Clinical Impression Statement Pt. seen to start therapy with slumped, forward rounded shoulder posture.  Tolerated initiation of conservative AROM and stabilization well today.  R sided cervical stretching throughout treatment to reduce tone on UT.  Some STM/TPR to R UT over area of most tenderness.  HEP cervical stretches issued to pt. via handout.  Low-level, neutral, theraband strengthening tolerated well today.  Some pain to end treatment in R-sided cervical area thus moist heat/E-stim applied to reduce post-exercise tone and pain.    PT Treatment/Interventions Electrical Stimulation;Cryotherapy;Iontophoresis 4mg /ml Dexamethasone;Ultrasound;Moist Heat;Therapeutic activities;Therapeutic exercise;Neuromuscular re-education;Patient/family  education;Passive range of motion;Manual techniques;Dry needling;Energy conservation;Taping   PT Next Visit Plan stabilization exercises to right shoulder; soft tissue work to right shoulder and cervical; scapula stabilization; modalities as needed      Patient will  benefit from skilled therapeutic intervention in order to improve the following deficits and impairments:  Increased muscle spasms, Decreased endurance, Decreased activity tolerance, Pain, Decreased strength  Visit Diagnosis: Cervicalgia  Acute pain of right shoulder  Muscle weakness (generalized)     Problem List Patient Active Problem List   Diagnosis Date Noted  . Type 2 diabetes mellitus without complication, without long-term current use of insulin ([place]) 01/14/2016  . Essential hypertension 01/14/2016  . Hyperlipidemia 01/14/2016  . Estrogen deficiency 05/17/2014  . Encounter for screening mammogram for breast cancer 05/17/2014  . Colon cancer screening 05/17/2014  . Hyperlipidemia LDL goal <100 05/17/2014  . Breast cancer screening 05/17/2014  . IBS (irritable bowel syndrome) 01/26/2013  . Other and unspecified hyperlipidemia 01/26/2013  . Vitamin D deficiency 01/05/2013  . Other malaise and fatigue 01/05/2013    Bess Harvest, PTA 03/11/17 12:22 PM  [facility] [address], [place], [place], [address] Phone: [phone]   Fax:  [phone]  Name: Joann Kennedy MRN: [id number] Date of Birth: [birth year]

## 2017-03-16 ENCOUNTER — Encounter: Payer: Self-pay | Admitting: Physical Therapy

## 2017-03-16 ENCOUNTER — Ambulatory Visit: Payer: Medicare Other | Admitting: Physical Therapy

## 2017-03-16 ENCOUNTER — Other Ambulatory Visit: Payer: Medicare Other

## 2017-03-16 DIAGNOSIS — M6281 Muscle weakness (generalized): Secondary | ICD-10-CM

## 2017-03-16 DIAGNOSIS — M25511 Pain in right shoulder: Secondary | ICD-10-CM | POA: Diagnosis not present

## 2017-03-16 DIAGNOSIS — I10 Essential (primary) hypertension: Secondary | ICD-10-CM

## 2017-03-16 DIAGNOSIS — E119 Type 2 diabetes mellitus without complications: Secondary | ICD-10-CM

## 2017-03-16 DIAGNOSIS — E782 Mixed hyperlipidemia: Secondary | ICD-10-CM | POA: Diagnosis not present

## 2017-03-16 DIAGNOSIS — M542 Cervicalgia: Secondary | ICD-10-CM

## 2017-03-16 LAB — LIPID PANEL
Cholesterol: 153 mg/dL (ref <200)
HDL: 55 mg/dL (ref >50)
LDL Cholesterol: 72 mg/dL (ref <100)
Total CHOL/HDL Ratio: 2.8 Ratio (ref <5.0)
Triglycerides: 132 mg/dL (ref <150)
VLDL: 26 mg/dL (ref <30)

## 2017-03-16 LAB — BASIC METABOLIC PANEL
BUN: 17 mg/dL (ref 7–25)
CO2: 26 mmol/L (ref 20–32)
Calcium: 9.2 mg/dL (ref 8.6–10.4)
Chloride: 109 mmol/L (ref 98–110)
Creat: 0.74 mg/dL (ref 0.50–0.99)
Glucose, Bld: 111 mg/dL — ABNORMAL HIGH (ref 65–99)
Potassium: 4.4 mmol/L (ref 3.5–5.3)
Sodium: 142 mmol/L (ref 135–146)

## 2017-03-16 NOTE — Patient Instructions (Signed)

## 2017-03-16 NOTE — Therapy (Signed)
[facility] [address] [place], [place], [address] Phone: [phone]   Fax:  [phone]  Physical Therapy Treatment  Patient Details  Name: Joann Kennedy MRN: [id number] Date of Birth: 02-Jun-1946 Referring Provider: Dr. Hollace Kinnier  Encounter Date: 03/16/2017      PT End of Session - 03/16/17 0844    Visit Number 3   Number of Visits 10   Date for PT Re-Evaluation 04/29/17   Authorization Type medicare g-code on 10th visit   PT Start Time 0813  came late   PT Stop Time 0845   PT Time Calculation (min) 32 min   Activity Tolerance Patient tolerated treatment well   Behavior During Therapy [facility] for tasks assessed/performed      Past Medical History:  Diagnosis Date  . Disorder of bone and cartilage, unspecified   . Dizziness and giddiness   . Hypercalcemia   . Hyperlipidemia   . Hypertension   . Hypopotassemia   . Other malaise and fatigue   . Other specified disease of white blood cells   . Special screening for malignant neoplasms, colon   . Type II or unspecified type diabetes mellitus without mention of complication, not stated as uncontrolled   . Unspecified vitamin D deficiency     Past Surgical History:  Procedure Laterality Date  . COLONOSCOPY  05/14/15   Dr. Collene Mares  . FRACTURE SURGERY  2001   Tibia & fibula  . TONSILLECTOMY  1996    There were no vitals filed for this visit.      Subjective Assessment - 03/16/17 0818    Subjective I still have shoulder pain.  It is not better yet. I can l now lay on right side.    Diagnostic tests MRI shows significant tears of biceps and supraspinatus tendons; arthritis of the shoulder joint.   Patient Stated Goals reduce pain   Currently in Pain? Yes   Pain Score 5    Pain Location Shoulder   Pain Orientation Right   Pain Descriptors / Indicators Constant   Pain Type Acute pain   Pain Onset More than a month ago   Pain Frequency Constant   Aggravating Factors  right hand overhead then going downward   Pain Relieving Factors rest   Multiple Pain Sites No            [facility] PT Assessment - 03/16/17 0001      Strength   Right Shoulder ABduction 3/5   Right Shoulder Internal Rotation 4/5   Right Shoulder External Rotation 4/5                     [facility] Adult PT Treatment/Exercise - 03/16/17 0001      Neck Exercises: Standing   Other Standing Exercises wall walking for right shoulder abduction for 10 times with therapist guiding scapula     Neck Exercises: Supine   Other Supine Exercise lay on foam roll- bil. shoulder horizontal abduction 10x, then y movement     Modalities   Modalities Iontophoresis     Iontophoresis   Type of Iontophoresis Dexamethasone   Location right RTC insertion   Dose 43ml  #1   Time 6 hour patch  IAX#[id number]     Manual Therapy   Manual Therapy Soft tissue mobilization   Soft tissue mobilization to right pectoralis and suprascapular  while on the foam roll     Neck Exercises: Stretches   Upper Trapezius Stretch 2  reps;30 seconds   Upper Trapezius Stretch Limitations R only    Levator Stretch 2 reps;30 seconds   Levator Stretch Limitations R only    Other Neck Stretches doorway stretch hold 30 sec 3 times with therapist guiding right shoulder                PT Education - 03/16/17 0841    Education provided Yes   Education Details information on ionto   Person(s) Educated Patient   Methods Explanation   Comprehension Verbalized understanding          PT Short Term Goals - 03/16/17 0846      PT SHORT TERM GOAL #1   Title independent with initial HEP   Time 4   Period Weeks   Status Achieved     PT SHORT TERM GOAL #2   Title able to put her bra on with 25% greater ease due to reduction in pain   Time 4   Period Weeks   Status On-going     PT SHORT TERM GOAL #3   Title able to put a pull over top with 25% greater ease due to decreased pain    Time 4   Period Weeks   Status On-going     PT SHORT TERM GOAL #4   Title understand ways to sleep with right arm propped up and sitting with right arm propped up   Time 4   Period Weeks   Status Achieved           PT Long Term Goals - 03/11/17 1215      PT LONG TERM GOAL #1   Title indepedent with HEP   Time 8   Period Weeks   Status On-going     PT LONG TERM GOAL #2   Title ability to take care of her grandchildren with 50% greater ease and minimal pain due to increased strength   Time 8   Period Weeks   Status On-going     PT LONG TERM GOAL #3   Title ability to dress without difficulty due to pain miminal   Time 8   Period Weeks   Status On-going     PT LONG TERM GOAL #4   Title able to lift 5 pounds with 2 hands due to increased right shoulder strength >/= 4/5   Time 8   Period Weeks   Status On-going     PT LONG TERM GOAL #5   Title FOTO score </= 34% limitation   Time 8   Period Weeks   Status On-going               Plan - 03/16/17 0830    Clinical Impression Statement Patient is now able to lay on her right shoulder.  Patient has increased right IR and ER strength to 4/5.  Patient has a tight right anterior shoulder and needs strength with back extensors. Patient will benefit from skilled therapy to reduce pain and improve function.    Rehab Potential Excellent   PT Frequency 2x / week   PT Duration 8 weeks   PT Treatment/Interventions Electrical Stimulation;Cryotherapy;Iontophoresis 4mg /ml Dexamethasone;Ultrasound;Moist Heat;Therapeutic activities;Therapeutic exercise;Neuromuscular re-education;Patient/family education;Passive range of motion;Manual techniques;Dry needling;Energy conservation;Taping   PT Next Visit Plan stabilization exercises to right shoulder; soft tissue work to right shoulder and cervical; scapula stabilization; modalities as needed; see if ionto helped   PT Home Exercise Plan progress as needed   Recommended Other Services  initial summary signed on 03/06/2017  Consulted and Agree with Plan of Care Patient      Patient will benefit from skilled therapeutic intervention in order to improve the following deficits and impairments:  Increased muscle spasms, Decreased endurance, Decreased activity tolerance, Pain, Decreased strength  Visit Diagnosis: Cervicalgia  Acute pain of right shoulder  Muscle weakness (generalized)     Problem List Patient Active Problem List   Diagnosis Date Noted  . Type 2 diabetes mellitus without complication, without long-term current use of insulin ([place]) 01/14/2016  . Essential hypertension 01/14/2016  . Hyperlipidemia 01/14/2016  . Estrogen deficiency 05/17/2014  . Encounter for screening mammogram for breast cancer 05/17/2014  . Colon cancer screening 05/17/2014  . Hyperlipidemia LDL goal <100 05/17/2014  . Breast cancer screening 05/17/2014  . IBS (irritable bowel syndrome) 01/26/2013  . Other and unspecified hyperlipidemia 01/26/2013  . Vitamin D deficiency 01/05/2013  . Other malaise and fatigue 01/05/2013    Earlie Counts, PT 03/16/17 8:48 AM   [facility] [address], [place], [place], [address] Phone: [phone]   Fax:  [phone]  Name: Abreanna Drawdy MRN: [id number] Date of Birth: 1946-06-10

## 2017-03-17 LAB — HEMOGLOBIN A1C
Hgb A1c MFr Bld: 7.5 % — ABNORMAL HIGH (ref <5.7)
Mean Plasma Glucose: 169 mg/dL

## 2017-03-18 ENCOUNTER — Encounter: Payer: Self-pay | Admitting: *Deleted

## 2017-03-18 ENCOUNTER — Ambulatory Visit: Payer: Medicare Other

## 2017-03-18 DIAGNOSIS — M25511 Pain in right shoulder: Secondary | ICD-10-CM | POA: Diagnosis not present

## 2017-03-18 DIAGNOSIS — M6281 Muscle weakness (generalized): Secondary | ICD-10-CM

## 2017-03-18 DIAGNOSIS — M542 Cervicalgia: Secondary | ICD-10-CM | POA: Diagnosis not present

## 2017-03-18 NOTE — Therapy (Signed)
[facility] [address], [place], [place], [address] Phone: [phone]   Fax:  [phone]  Physical Therapy Treatment  Patient Details  Name: Joann Kennedy MRN: [id number] Date of Birth: Dec 04, 1945 Referring Provider: Dr. Hollace Kinnier  Encounter Date: 03/18/2017      PT End of Session - 03/18/17 0943    Visit Number 4   Number of Visits 10   Date for PT Re-Evaluation 04/29/17   Authorization Type medicare g-code on 10th visit   PT Start Time [phone]  pt. arrived late    PT Stop Time 1020   PT Time Calculation (min) 42 min   Activity Tolerance Patient tolerated treatment well   Behavior During Therapy [facility] for tasks assessed/performed      Past Medical History:  Diagnosis Date  . Disorder of bone and cartilage, unspecified   . Dizziness and giddiness   . Hypercalcemia   . Hyperlipidemia   . Hypertension   . Hypopotassemia   . Other malaise and fatigue   . Other specified disease of white blood cells   . Special screening for malignant neoplasms, colon   . Type II or unspecified type diabetes mellitus without mention of complication, not stated as uncontrolled   . Unspecified vitamin D deficiency     Past Surgical History:  Procedure Laterality Date  . COLONOSCOPY  05/14/15   Dr. Collene Mares  . FRACTURE SURGERY  2001   Tibia & fibula  . TONSILLECTOMY  1996    There were no vitals filed for this visit.      Subjective Assessment - 03/18/17 0940    Subjective Pt. noting improvement in neck and shoulder status today.     Patient Stated Goals reduce pain   Currently in Pain? Yes   Pain Score 3    Pain Location Shoulder   Pain Orientation Right   Pain Descriptors / Indicators Constant   Pain Onset More than a month ago   Pain Frequency Constant   Aggravating Factors  Reaching behind back    Pain Relieving Factors rest, herbs    Multiple Pain Sites No                         [facility]  Adult PT Treatment/Exercise - 03/18/17 0949      Shoulder Exercises: Supine   Protraction 20 reps;Weights   Protraction Weight (lbs) 3   Horizontal ABduction Both;15 reps;Theraband   Theraband Level (Shoulder Horizontal ABduction) Level 2 (Red)   Horizontal ABduction Limitations laying on pool noodle; cues for scap. squeeze    External Rotation Both;10 reps   Theraband Level (Shoulder External Rotation) Level 1 (Yellow)   External Rotation Limitations laying on pool noodle   Flexion 10 reps;AAROM;Both   Flexion Limitations with golf club; 90dg-180 dg      Shoulder Exercises: Sidelying   External Rotation Right;10 reps;Limitations   External Rotation Weight (lbs) 1    ABduction Right;5 reps   ABduction Limitations terminated due to pain     Shoulder Exercises: ROM/Strengthening   Rhythmic Stabilization, Supine R shoulder rhythmic stabilization at 90 dg flexion with therapist x 1 min      Iontophoresis   Type of Iontophoresis Dexamethasone   Location right RTC insertion   Dose 61ml  #2   Time 6 hour patch  Lot # [id number]     Manual Therapy   Manual Therapy Soft tissue mobilization   Soft tissue mobilization STM  to cervical paraspinals and R UT to promote reduction in tone                  PT Short Term Goals - 03/16/17 0846      PT SHORT TERM GOAL #1   Title independent with initial HEP   Time 4   Period Weeks   Status Achieved     PT SHORT TERM GOAL #2   Title able to put her bra on with 25% greater ease due to reduction in pain   Time 4   Period Weeks   Status On-going     PT SHORT TERM GOAL #3   Title able to put a pull over top with 25% greater ease due to decreased pain   Time 4   Period Weeks   Status On-going     PT SHORT TERM GOAL #4   Title understand ways to sleep with right arm propped up and sitting with right arm propped up   Time 4   Period Weeks   Status Achieved           PT Long Term Goals - 03/11/17 1215      PT LONG TERM  GOAL #1   Title indepedent with HEP   Time 8   Period Weeks   Status On-going     PT LONG TERM GOAL #2   Title ability to take care of her grandchildren with 50% greater ease and minimal pain due to increased strength   Time 8   Period Weeks   Status On-going     PT LONG TERM GOAL #3   Title ability to dress without difficulty due to pain miminal   Time 8   Period Weeks   Status On-going     PT LONG TERM GOAL #4   Title able to lift 5 pounds with 2 hands due to increased right shoulder strength >/= 4/5   Time 8   Period Weeks   Status On-going     PT LONG TERM GOAL #5   Title FOTO score </= 34% limitation   Time 8   Period Weeks   Status On-going               Plan - 03/18/17 0945    Clinical Impression Statement Pt. doing well today noting improvement in shoulder and neck status with lower pain levels over last few days.  Treatment focusing on supine and sidelying AROM and strengthening to pt. tolerance.  Pt. unable to perform sidelying shoulder abduction today without pain thus this activity terminated.  Notes Ionto patch applied to shoulder helped thus ionto patch #2/6 applied to end treatment.  Pt. seem to be progressing well.     PT Treatment/Interventions Electrical Stimulation;Cryotherapy;Iontophoresis 4mg /ml Dexamethasone;Ultrasound;Moist Heat;Therapeutic activities;Therapeutic exercise;Neuromuscular re-education;Patient/family education;Passive range of motion;Manual techniques;Dry needling;Energy conservation;Taping   PT Next Visit Plan stabilization exercises to right shoulder; soft tissue work to right shoulder and cervical; scapula stabilization; modalities as needed; see if ionto helped      Patient will benefit from skilled therapeutic intervention in order to improve the following deficits and impairments:  Increased muscle spasms, Decreased endurance, Decreased activity tolerance, Pain, Decreased strength  Visit Diagnosis: Cervicalgia  Acute pain of  right shoulder  Muscle weakness (generalized)     Problem List Patient Active Problem List   Diagnosis Date Noted  . Type 2 diabetes mellitus without complication, without long-term current use of insulin ([place]) 01/14/2016  . Essential hypertension 01/14/2016  .  Hyperlipidemia 01/14/2016  . Estrogen deficiency 05/17/2014  . Encounter for screening mammogram for breast cancer 05/17/2014  . Colon cancer screening 05/17/2014  . Hyperlipidemia LDL goal <100 05/17/2014  . Breast cancer screening 05/17/2014  . IBS (irritable bowel syndrome) 01/26/2013  . Other and unspecified hyperlipidemia 01/26/2013  . Vitamin D deficiency 01/05/2013  . Other malaise and fatigue 01/05/2013    Bess Harvest, PTA 03/18/17 2:22 PM   [facility] [address], [place], [place], [address] Phone: [phone]   Fax:  [phone]  Name: Joann Kennedy MRN: [id number] Date of Birth: 1946-03-21

## 2017-03-23 ENCOUNTER — Ambulatory Visit (INDEPENDENT_AMBULATORY_CARE_PROVIDER_SITE_OTHER): Payer: Medicare Other | Admitting: Internal Medicine

## 2017-03-23 ENCOUNTER — Ambulatory Visit: Payer: Medicare Other | Admitting: Physical Therapy

## 2017-03-23 ENCOUNTER — Encounter: Payer: Self-pay | Admitting: Internal Medicine

## 2017-03-23 ENCOUNTER — Encounter: Payer: Self-pay | Admitting: Physical Therapy

## 2017-03-23 VITALS — BP 120/70 | HR 76 | Temp 98.0°F | Wt 125.0 lb

## 2017-03-23 DIAGNOSIS — M25511 Pain in right shoulder: Secondary | ICD-10-CM | POA: Diagnosis not present

## 2017-03-23 DIAGNOSIS — M6281 Muscle weakness (generalized): Secondary | ICD-10-CM | POA: Diagnosis not present

## 2017-03-23 DIAGNOSIS — M47812 Spondylosis without myelopathy or radiculopathy, cervical region: Secondary | ICD-10-CM | POA: Diagnosis not present

## 2017-03-23 DIAGNOSIS — E782 Mixed hyperlipidemia: Secondary | ICD-10-CM

## 2017-03-23 DIAGNOSIS — E119 Type 2 diabetes mellitus without complications: Secondary | ICD-10-CM | POA: Diagnosis not present

## 2017-03-23 DIAGNOSIS — S46001D Unspecified injury of muscle(s) and tendon(s) of the rotator cuff of right shoulder, subsequent encounter: Secondary | ICD-10-CM

## 2017-03-23 DIAGNOSIS — M542 Cervicalgia: Secondary | ICD-10-CM

## 2017-03-23 DIAGNOSIS — I10 Essential (primary) hypertension: Secondary | ICD-10-CM | POA: Diagnosis not present

## 2017-03-23 NOTE — Progress Notes (Signed)
Location:  [facility] clinic Provider:  Reis Goga L. Mariea Clonts, D.O., C.M.D.  Code Status: full code Goals of Care:  Advanced Directives 03/23/2017  Does Patient Have a Medical Advance Directive? No  Would patient like information on creating a medical advance directive? No - Patient declined   Chief Complaint  Patient presents with  . Medical Management of Chronic Issues    37mth follow-up, discuss cervical spine MRI    HPI: Patient is a 71 y.o. female seen today for medical management of chronic diseases.    Still having shoulder pain.  She said she had a neck pain, too and Dr. Marlou Sa referred her for MRI neck.  It reveals some moderate foraminal narrowing at C6-7 but does not note compression of nerves. She is going for PT now which has helped both her neck and shoulder.  She is going for an herbal oil treatment like she's had back home.  Pain is less than before with those.   Is able to sleep better now.  Interestingly, she reports that she cannot do many of the things she loves b/c of her shoulder injury including pick up her grandchildren, knit, sew and garden (hurt it lifting heavy pots).  Discussed no more than 5-10 lbs.  We discussed effect on quality of life due to this which pointed toward surgery for correction.  Her husband does not seem to want her to have surgery saying that the recovery is long and painful (accurate, but she is functional and able), she "is old" and she is likely to reinjure it due to persistent activity.  She also has periods of time she cannot stand the pain though there's been improvement with therapy.  They asked if there would be self-healing and I said only to an extent and that she must continue activity to avoid frozen shoulder.  Pt is rolling her eyes while her husband speaks.  She is using ibuprofen about 2x per week when pain is "unbearable".    DMII:  hba1c has improved a bit since last check.  Continues on same meds.  Less active though.  Hyperlipidemia:  LDL  trended up.  On max dose pravachol.  She has difficulty resisting tasty foods the rest of the family is eating. Discussed options are to resist some of those, do more walking or cardio type exercise or switch medications to a stronger one (she was highly opposed to the 3rd).  Says she'll work on the exercise portion.  Past Medical History:  Diagnosis Date  . Disorder of bone and cartilage, unspecified   . Dizziness and giddiness   . Hypercalcemia   . Hyperlipidemia   . Hypertension   . Hypopotassemia   . Other malaise and fatigue   . Other specified disease of white blood cells   . Special screening for malignant neoplasms, colon   . Type II or unspecified type diabetes mellitus without mention of complication, not stated as uncontrolled   . Unspecified vitamin D deficiency     Past Surgical History:  Procedure Laterality Date  . COLONOSCOPY  05/14/15   Dr. Collene Mares  . FRACTURE SURGERY  2001   Tibia & fibula  . TONSILLECTOMY  1996    Allergies  Allergen Reactions  . Metformin And Related Diarrhea    Allergies as of 03/23/2017      Reactions   Metformin And Related Diarrhea      Medication List       Accurate as of 03/23/17  2:41 PM. Always  use your most recent med list.          benazepril-hydrochlorthiazide 20-12.5 MG tablet Commonly known as:  LOTENSIN HCT take 1 tablet by mouth once daily   Calcium Carbonate-Vitamin D 600-400 MG-UNIT tablet Take 1 tablet by mouth daily.   empagliflozin 25 MG Tabs tablet Commonly known as:  JARDIANCE Take 25 mg by mouth daily.   freestyle lancets TEST as directed   glucose blood test strip Commonly known as:  FREESTYLE LITE Use as instructed to test blood sugar once daily dx E11.9   linagliptin 5 MG Tabs tablet Commonly known as:  TRADJENTA Take 1 tablet (5 mg total) by mouth daily.   omeprazole 40 MG capsule Commonly known as:  PRILOSEC Take 40 mg by mouth daily. Reported on 09/10/2015   pravastatin 80 MG  tablet Commonly known as:  PRAVACHOL Take 1 tablet (80 mg total) by mouth daily.       Review of Systems:  Review of Systems  Constitutional: Negative for chills, fever and malaise/fatigue.  HENT: Negative for hearing loss.   Eyes: Negative for blurred vision.       Glasses  Respiratory: Negative for cough and shortness of breath.   Cardiovascular: Negative for chest pain, palpitations and leg swelling.  Gastrointestinal: Negative for diarrhea.  Genitourinary: Negative for dysuria.  Musculoskeletal: Positive for joint pain and myalgias. Negative for falls and neck pain.  Skin: Negative for itching and rash.  Neurological: Negative for dizziness, tingling, sensory change, loss of consciousness and weakness.  Endo/Heme/Allergies: Does not bruise/bleed easily.       Diabetes  Psychiatric/Behavioral: Negative for depression and memory loss.    Health Maintenance  Topic Date Due  . OPHTHALMOLOGY EXAM  11/02/1957  . FOOT EXAM  01/13/2017  . INFLUENZA VACCINE  03/04/2017  . Hepatitis C Screening  08/04/2017 (Originally 09/13/1945)  . PNA vac Low Risk Adult (2 of 2 - PPSV23) 04/21/2017  . HEMOGLOBIN A1C  09/16/2017  . MAMMOGRAM  10/21/2017  . TETANUS/TDAP  12/31/2024  . COLONOSCOPY  05/13/2025  . DEXA SCAN  Completed    Physical Exam: Vitals:   03/23/17 1433  BP: 120/70  Pulse: 76  Temp: 98 F (36.7 C)  TempSrc: Oral  SpO2: 98%  Weight: 125 lb (56.7 kg)   Body mass index is 22.86 kg/m. Physical Exam  Constitutional: She is oriented to person, place, and time. She appears well-developed and well-nourished. No distress.  Cardiovascular: Normal rate, regular rhythm, normal heart sounds and intact distal pulses.   Pulmonary/Chest: Effort normal and breath sounds normal. No respiratory distress.  Abdominal: Bowel sounds are normal.  Musculoskeletal: Normal range of motion.  Tender over lateral shoulder and posterior shoulder during ROM  Neurological: She is alert and  oriented to person, place, and time.  Skin: Skin is warm and dry.  Psychiatric: She has a normal mood and affect.    Labs reviewed: Basic Metabolic Panel:  Recent Labs  04/17/16 0858 08/18/16 1118 03/16/17 0907  NA 140  --  142  K 4.1  --  4.4  CL 105  --  109  CO2 26  --  26  GLUCOSE 127*  --  111*  BUN 18  --  17  CREATININE 0.76  --  0.74  CALCIUM 9.4  --  9.2  TSH  --  0.73  --    Liver Function Tests: No results for input(s): AST, ALT, ALKPHOS, BILITOT, PROT, ALBUMIN in the last 8760 hours. No results  for input(s): LIPASE, AMYLASE in the last 8760 hours. No results for input(s): AMMONIA in the last 8760 hours. CBC: No results for input(s): WBC, NEUTROABS, HGB, HCT, MCV, PLT in the last 8760 hours. Lipid Panel:  Recent Labs  08/05/16 1028 11/18/16 0831 03/16/17 0907  CHOL 187 162 153  HDL 57 57 55  LDLCALC 97 46 72  TRIG 163* 296* 132  CHOLHDL 3.3 2.8 2.8   Lab Results  Component Value Date   HGBA1C 7.5 (H) 03/16/2017    Procedures since last visit: Mr Cervical Spine W/o Contrast  Result Date: 03/04/2017 CLINICAL DATA:  Neck pain. Right radiculopathy with arm pain and weakness. EXAM: MRI CERVICAL SPINE WITHOUT CONTRAST TECHNIQUE: Multiplanar, multisequence MR imaging of the cervical spine was performed. No intravenous contrast was administered. COMPARISON:  None. FINDINGS: Alignment: Slight anterolisthesis at C7-T1, that appears facet mediated. Remainder of the cervical spine is straightened. Vertebrae: Heterogeneous marrow, mainly lower cervical vertebral bodies that are affected by disc degeneration. No focal lesion. No fracture deformity or discitis. Cord: Normal signal and morphology. Posterior Fossa, vertebral arteries, paraspinal tissues: Minimal patchy T2 hyperintensity in the pons, usually microvascular ischemic change. Patient has vascular risk factors per EMR. Disc levels: C2-3: Unremarkable. C3-4: No impingement or notable degenerative change C4-5: No  impingement or notable degenerative change C5-6: Moderate disc narrowing with small uncovertebral spurs. Patent canal and foramina C6-7: Greatest level of disc degeneration with narrowing and uncovertebral/endplate ridging. Ligamentum flavum thickening. Spinal stenosis causing mild cord flattening without cord signal abnormality. Mild to moderate bilateral foraminal narrowing, greater on the right. C7-T1:Facet arthropathy with slight anterolisthesis. Unremarkable disc. No impingement. IMPRESSION: 1. C6-7 predominant disc degeneration with disc osteophyte complex and ligamentum flavum thickening causing mild cord flattening. No cord signal abnormality. Uncovertebral spurs cause mild to moderate foraminal stenosis at this level, stenosis greater on the right. 2. C7-T1 facet arthropathy with slight anterolisthesis. Electronically Signed   By: Monte Fantasia M.D.   On: 03/04/2017 08:39    Assessment/Plan 1. Injury of right rotator cuff, subsequent encounter -with supraspinatus and biceps tendon tears, arthritis, and inflammation -doing PT -saw ortho who said she should have surgery if pain unbearable--discussed also if qol affected--pt definitely could do therapy and has a good chance of recovery, but her husband is concerned about reinjury -cont ibuprofen a few times a week at most, ice, heat when painful -avoid lifting more than 5-10 lbs -neck pain resolved and did not look like surgery was indicated for it -she is to call back and let me know if she is making an adequate recovery or wants to proceed with surgery  2. Essential hypertension -bp at goal with current therapy, cont medication--discussed that ACE also protects her kidneys from diabetes (she is not thrilled and does not seem to understand that bp is normal b/c of the medication)  3. Mixed hyperlipidemia -cont pravachol high dose, but add more walking, cardio exercise (that can be done with shoulder injury--more lower body work) - Lipid  panel; Future  4. Type 2 diabetes mellitus without complication, without long-term current use of insulin (HCC) -controlled, again would improve with more cardio -cont tradjenta and jardiance also for heart protective benefit, has refused to take asa - Hemoglobin A1c; Future - Lipid panel; Future - Basic metabolic panel; Future  5. Osteoarthritis of cervical spine, unspecified spinal osteoarthritis complication status -noted on her c spine mri with moderate foraminal narrowing at base of neck C6-7 -not warranting surgery and pain better with PT  and oil therapy  Labs/tests ordered:   Orders Placed This Encounter  Procedures  . Hemoglobin A1c    Standing Status:   Future    Standing Expiration Date:   11/21/2017  . Lipid panel    Standing Status:   Future    Standing Expiration Date:   11/21/2017    Order Specific Question:   Has the patient fasted?    Answer:   Yes  . Basic metabolic panel    Standing Status:   Future    Standing Expiration Date:   11/21/2017    Order Specific Question:   Has the patient fasted?    Answer:   Yes   Next appt:  07/13/2017 med mgt, labs before  [place] Gracynn Rajewski, D.O. [facility] [address], [place] [address] Cell Phone (Mon-Fri 8am-5pm):  [phone] On Call:  [phone] & follow prompts after 5pm & weekends Office Phone:  [phone] Office Fax:  [phone]

## 2017-03-23 NOTE — Patient Instructions (Signed)
Posture - Standing   Good posture is important. Avoid slouching and forward head thrust. Maintain curve in low back and align ears over shoulders, hips over ankles.  Pull your belly button in toward your back bone. Posture Tips DO: - stand tall and erect - keep chin tucked in - keep head and shoulders in alignment - check posture regularly in mirror or large window - pull head back against headrest in car seat;  Change your position often.  Sit with lumbar support. DON'T: - slouch or slump while watching TV or reading - sit, stand or lie in one position  for too long;  Sitting is especially hard on the spine so if you sit at a desk/use the computer, then stand up often! Copyright  VHI. All rights reserved.  Posture - Sitting  Sit upright, head facing forward. Try using a roll to support lower back. Keep shoulders relaxed, and avoid rounded back. Keep hips level with knees. Avoid crossing legs for long periods. Copyright  VHI. All rights reserved.  Chronic neck strain can develop because of poor posture and faulty work habits  Postural strain related to slumped sitting and forward head posture is a leading cause of headaches, neck and upper back pain  General strengthening and flexibility exercises are helpful in the treatment of neck pain.  Most importantly, you should learn to correct the posture that may be contributing to chronic pain.   Change positions frequently  Change your work or home environment to improve posture and mechanics.    

## 2017-03-23 NOTE — Therapy (Signed)
[facility] [address], [place], [place], [address] Phone: [phone]   Fax:  [phone]  Physical Therapy Treatment  Patient Details  Name: Joann Kennedy MRN: [id number] Date of Birth: 09/24/1945 Referring Provider: Dr. Hollace Kinnier  Encounter Date: 03/23/2017      PT End of Session - 03/23/17 0907    PT Start Time 0902  arrived late   PT Stop Time 0932   PT Time Calculation (min) 30 min   Activity Tolerance Patient tolerated treatment well   Behavior During Therapy [facility] for tasks assessed/performed      Past Medical History:  Diagnosis Date  . Disorder of bone and cartilage, unspecified   . Dizziness and giddiness   . Hypercalcemia   . Hyperlipidemia   . Hypertension   . Hypopotassemia   . Other malaise and fatigue   . Other specified disease of white blood cells   . Special screening for malignant neoplasms, colon   . Type II or unspecified type diabetes mellitus without mention of complication, not stated as uncontrolled   . Unspecified vitamin D deficiency     Past Surgical History:  Procedure Laterality Date  . COLONOSCOPY  05/14/15   Dr. Collene Mares  . FRACTURE SURGERY  2001   Tibia & fibula  . TONSILLECTOMY  1996    There were no vitals filed for this visit.      Subjective Assessment - 03/23/17 0917    Subjective Pt notes some skin irritation where the patch was.  States she is having a little pain but not much todya.   Currently in Pain? Yes   Pain Score 3    Pain Location Shoulder   Pain Orientation Right   Pain Descriptors / Indicators Aching   Pain Type Acute pain   Pain Onset More than a month ago   Pain Frequency Intermittent   Aggravating Factors  bringing something down from overhead   Pain Relieving Factors rest   Effect of Pain on Daily Activities house work, lifting plate to the microwave   Multiple Pain Sites No                         [facility] Adult PT  Treatment/Exercise - 03/23/17 0001      Shoulder Exercises: Supine   Protraction 20 reps;Weights   Protraction Weight (lbs) 3   Horizontal ABduction Both;15 reps;Theraband   Theraband Level (Shoulder Horizontal ABduction) Level 2 (Red)   External Rotation Both;20 reps;Theraband   Theraband Level (Shoulder External Rotation) Level 2 (Red)     Shoulder Exercises: Sidelying   External Rotation Right;20 reps;Strengthening   External Rotation Weight (lbs) 2     Shoulder Exercises: ROM/Strengthening   Other ROM/Strengthening Exercises pully shoulder flexion - 3 min                PT Education - 03/23/17 0932    Education provided Yes   Education Details posture education   Person(s) Educated Patient   Methods Explanation;Demonstration;Handout   Comprehension Verbalized understanding;Returned demonstration          PT Short Term Goals - 03/16/17 0846      PT SHORT TERM GOAL #1   Title independent with initial HEP   Time 4   Period Weeks   Status Achieved     PT SHORT TERM GOAL #2   Title able to put her bra on with 25% greater ease due to reduction in  pain   Time 4   Period Weeks   Status On-going     PT SHORT TERM GOAL #3   Title able to put a pull over top with 25% greater ease due to decreased pain   Time 4   Period Weeks   Status On-going     PT SHORT TERM GOAL #4   Title understand ways to sleep with right arm propped up and sitting with right arm propped up   Time 4   Period Weeks   Status Achieved           PT Long Term Goals - 03/11/17 1215      PT LONG TERM GOAL #1   Title indepedent with HEP   Time 8   Period Weeks   Status On-going     PT LONG TERM GOAL #2   Title ability to take care of her grandchildren with 50% greater ease and minimal pain due to increased strength   Time 8   Period Weeks   Status On-going     PT LONG TERM GOAL #3   Title ability to dress without difficulty due to pain miminal   Time 8   Period Weeks    Status On-going     PT LONG TERM GOAL #4   Title able to lift 5 pounds with 2 hands due to increased right shoulder strength >/= 4/5   Time 8   Period Weeks   Status On-going     PT LONG TERM GOAL #5   Title FOTO score </= 34% limitation   Time 8   Period Weeks   Status On-going               Plan - 03/23/17 0940    Clinical Impression Statement Pt arrived late.  She had muscle spasm and tightness around right scap, UT, anterior deltoids.  Did not do ionto due to pt having skin irritation from previous patch.  Pt will benefit from skilled PT to continue working on shoulder strength and posture.     PT Treatment/Interventions Electrical Stimulation;Cryotherapy;Iontophoresis 4mg /ml Dexamethasone;Ultrasound;Moist Heat;Therapeutic activities;Therapeutic exercise;Neuromuscular re-education;Patient/family education;Passive range of motion;Manual techniques;Dry needling;Energy conservation;Taping   PT Next Visit Plan stabilization exercises to right shoulder; soft tissue work to right shoulder and cervical; scapula stabilization; modalities as needed;      Patient will benefit from skilled therapeutic intervention in order to improve the following deficits and impairments:  Increased muscle spasms, Decreased endurance, Decreased activity tolerance, Pain, Decreased strength  Visit Diagnosis: Cervicalgia  Acute pain of right shoulder  Muscle weakness (generalized)     Problem List Patient Active Problem List   Diagnosis Date Noted  . Type 2 diabetes mellitus without complication, without long-term current use of insulin ([place]) 01/14/2016  . Essential hypertension 01/14/2016  . Hyperlipidemia 01/14/2016  . Estrogen deficiency 05/17/2014  . Encounter for screening mammogram for breast cancer 05/17/2014  . Colon cancer screening 05/17/2014  . Hyperlipidemia LDL goal <100 05/17/2014  . Breast cancer screening 05/17/2014  . IBS (irritable bowel syndrome) 01/26/2013  . Other and  unspecified hyperlipidemia 01/26/2013  . Vitamin D deficiency 01/05/2013  . Other malaise and fatigue 01/05/2013    Zannie Cove, PT 03/23/2017, 10:06 AM  [facility] [address] [place], [place], [address] Phone: [phone]   Fax:  [phone]  Name: Joann Kennedy MRN: [id number] Date of Birth: Dec 03, 1945

## 2017-03-25 ENCOUNTER — Encounter: Payer: Self-pay | Admitting: Physical Therapy

## 2017-03-25 ENCOUNTER — Ambulatory Visit: Payer: Medicare Other | Admitting: Physical Therapy

## 2017-03-25 DIAGNOSIS — M6281 Muscle weakness (generalized): Secondary | ICD-10-CM | POA: Diagnosis not present

## 2017-03-25 DIAGNOSIS — M542 Cervicalgia: Secondary | ICD-10-CM | POA: Diagnosis not present

## 2017-03-25 DIAGNOSIS — M25511 Pain in right shoulder: Secondary | ICD-10-CM

## 2017-03-25 NOTE — Patient Instructions (Signed)
  PNF Strengthening: Resisted   Lay on back with resistive band around each hand, bring right arm up and away, thumb back. Repeat _10___ times per set. Do _2___ sets per session. Do _1-2___ sessions per day.      Resisted Horizontal Abduction: Bilateral   Lay on back, tubing in both hands, arms out in front. Keeping arms straight, pinch shoulder blades together and stretch arms out. Repeat _10___ times per set. Do 2____ sets per session. Do _1-2___ sessions per day.   Scapular Retraction: Elbow Flexion (Standing)   Lay on back with red band in hands. With elbows bent to 90, pinch shoulder blades together and rotate arms out, keeping elbows bent. Repeat _10___ times per set. Do _1___ sets per session. Do many____ sessions per day.    [facility] [address] [place], [place] [address] Phone # [phone] Fax [phone]

## 2017-03-25 NOTE — Therapy (Signed)
[facility] [address], [place], [place], [address] Phone: [phone]   Fax:  [phone]  Physical Therapy Treatment  Patient Details  Name: Joann Kennedy MRN: [id number] Date of Birth: [birth year] Referring Provider: Dr. Hollace Kinnier  Encounter Date: 03/25/2017      PT End of Session - 03/25/17 0956    Visit Number 6   Number of Visits 10   Date for PT Re-Evaluation 04/29/17   Authorization Type medicare g-code on 10th visit   PT Start Time 0930   PT Stop Time 1008   PT Time Calculation (min) 38 min   Activity Tolerance Patient tolerated treatment well   Behavior During Therapy [facility] for tasks assessed/performed      Past Medical History:  Diagnosis Date  . Disorder of bone and cartilage, unspecified   . Dizziness and giddiness   . Hypercalcemia   . Hyperlipidemia   . Hypertension   . Hypopotassemia   . Other malaise and fatigue   . Other specified disease of white blood cells   . Special screening for malignant neoplasms, colon   . Type II or unspecified type diabetes mellitus without mention of complication, not stated as uncontrolled   . Unspecified vitamin D deficiency     Past Surgical History:  Procedure Laterality Date  . COLONOSCOPY  05/14/15   Dr. Collene Mares  . FRACTURE SURGERY  2001   Tibia & fibula  . TONSILLECTOMY  1996    There were no vitals filed for this visit.      Subjective Assessment - 03/25/17 0936    Subjective I saw the doctor. He is happy with my progress. Patient reports her pain is 50% better. Patient is not doing alot of housework this week.    Diagnostic tests MRI shows significant tears of biceps and supraspinatus tendons; arthritis of the shoulder joint.   Patient Stated Goals reduce pain   Currently in Pain? Yes   Pain Score 4    Pain Location Shoulder   Pain Orientation Right   Pain Descriptors / Indicators Aching   Pain Type Acute pain   Pain Onset More than a  month ago   Pain Frequency Intermittent   Aggravating Factors  lifting right arm overhead   Pain Relieving Factors rest            [facility] PT Assessment - 03/25/17 0001      Strength   Right Shoulder Flexion 5/5   Right Shoulder Extension 5/5                     [facility] Adult PT Treatment/Exercise - 03/25/17 0001      Shoulder Exercises: Supine   Protraction 20 reps;Weights   Protraction Weight (lbs) 3   Protraction Limitations needs verbal and tactile cues to keep elbows straight   Horizontal ABduction Both;Theraband;20 reps   Theraband Level (Shoulder Horizontal ABduction) Level 2 (Red)   External Rotation Both;20 reps;Theraband   Theraband Level (Shoulder External Rotation) Level 2 (Red)   Other Supine Exercises shoulder press hold 5 sec 10 times     Shoulder Exercises: Sidelying   External Rotation Right;20 reps;Strengthening   External Rotation Weight (lbs) 2     Shoulder Exercises: ROM/Strengthening   Other ROM/Strengthening Exercises pully shoulder flexion - 3 min     Manual Therapy   Manual Therapy Soft tissue mobilization   Soft tissue mobilization right cervical paraspinals; right anterior shoulder, right interscapular area  PT Education - 03/25/17 1009    Education provided Yes   Education Details scapula strengthening with red band in supine   Person(s) Educated Patient   Methods Explanation;Demonstration;Verbal cues;Handout   Comprehension Verbalized understanding;Returned demonstration          PT Short Term Goals - 03/25/17 0939      PT SHORT TERM GOAL #2   Title able to put her bra on with 25% greater ease due to reduction in pain   Time 4   Period Weeks   Status Achieved     PT SHORT TERM GOAL #3   Title able to put a pull over top with 25% greater ease due to decreased pain   Time 4   Period Weeks   Status Achieved     PT SHORT TERM GOAL #4   Title understand ways to sleep with right arm propped up  and sitting with right arm propped up   Time 4   Period Weeks   Status Achieved           PT Long Term Goals - 03/11/17 1215      PT LONG TERM GOAL #1   Title indepedent with HEP   Time 8   Period Weeks   Status On-going     PT LONG TERM GOAL #2   Title ability to take care of her grandchildren with 50% greater ease and minimal pain due to increased strength   Time 8   Period Weeks   Status On-going     PT LONG TERM GOAL #3   Title ability to dress without difficulty due to pain miminal   Time 8   Period Weeks   Status On-going     PT LONG TERM GOAL #4   Title able to lift 5 pounds with 2 hands due to increased right shoulder strength >/= 4/5   Time 8   Period Weeks   Status On-going     PT LONG TERM GOAL #5   Title FOTO score </= 34% limitation   Time 8   Period Weeks   Status On-going               Plan - 03/25/17 1011    Clinical Impression Statement Patient pain is 50% decreased. Patient has increased strength of right shoulder flexion and extension.  Patient is able to dress herself and reach overhead with 25% easier.  Patient will benefit from skilled therapy to work on scapula strength and shoulder strength.     Rehab Potential Excellent   PT Frequency 2x / week   PT Duration 8 weeks   PT Treatment/Interventions Electrical Stimulation;Cryotherapy;Iontophoresis 4mg /ml Dexamethasone;Ultrasound;Moist Heat;Therapeutic activities;Therapeutic exercise;Neuromuscular re-education;Patient/family education;Passive range of motion;Manual techniques;Dry needling;Energy conservation;Taping   PT Next Visit Plan stabilization exercises to right shoulder; soft tissue work to right shoulder and cervical; scapula stabilization; modalities as needed; Work on [place] progress as needed   Consulted and Agree with Plan of Care Patient      Patient will benefit from skilled therapeutic intervention in order to improve the following deficits and  impairments:  Increased muscle spasms, Decreased endurance, Decreased activity tolerance, Pain, Decreased strength  Visit Diagnosis: Cervicalgia  Acute pain of right shoulder  Muscle weakness (generalized)     Problem List Patient Active Problem List   Diagnosis Date Noted  . Type 2 diabetes mellitus without complication, without long-term current use of insulin ([place]) 01/14/2016  . Essential hypertension 01/14/2016  .  Hyperlipidemia 01/14/2016  . Estrogen deficiency 05/17/2014  . Encounter for screening mammogram for breast cancer 05/17/2014  . Colon cancer screening 05/17/2014  . Hyperlipidemia LDL goal <100 05/17/2014  . Breast cancer screening 05/17/2014  . IBS (irritable bowel syndrome) 01/26/2013  . Other and unspecified hyperlipidemia 01/26/2013  . Vitamin D deficiency 01/05/2013  . Other malaise and fatigue 01/05/2013    Earlie Counts, PT 03/25/17 10:15 AM   [facility] [address] [place], [place], [address] Phone: [phone]   Fax:  [phone]  Name: Joann Kennedy MRN: [id number] Date of Birth: 01-07-1946

## 2017-03-30 ENCOUNTER — Ambulatory Visit: Payer: Medicare Other

## 2017-03-30 DIAGNOSIS — M25511 Pain in right shoulder: Secondary | ICD-10-CM

## 2017-03-30 DIAGNOSIS — M6281 Muscle weakness (generalized): Secondary | ICD-10-CM

## 2017-03-30 DIAGNOSIS — M542 Cervicalgia: Secondary | ICD-10-CM | POA: Diagnosis not present

## 2017-03-30 NOTE — Therapy (Signed)
[facility] [address] [place], [place], [address] Phone: [phone]   Fax:  [phone]  Physical Therapy Treatment  Patient Details  Name: Joann Kennedy MRN: [id number] Date of Birth: 02-Sep-1945 Referring Provider: Dr. Hollace Kinnier  Encounter Date: 03/30/2017      PT End of Session - 03/30/17 1010    Visit Number 7   Date for PT Re-Evaluation 04/29/17   Authorization Type medicare g-code on 10th visit   PT Start Time 0930   PT Stop Time 1010   PT Time Calculation (min) 40 min   Activity Tolerance Patient tolerated treatment well   Behavior During Therapy [facility] for tasks assessed/performed      Past Medical History:  Diagnosis Date  . Disorder of bone and cartilage, unspecified   . Dizziness and giddiness   . Hypercalcemia   . Hyperlipidemia   . Hypertension   . Hypopotassemia   . Other malaise and fatigue   . Other specified disease of white blood cells   . Special screening for malignant neoplasms, colon   . Type II or unspecified type diabetes mellitus without mention of complication, not stated as uncontrolled   . Unspecified vitamin D deficiency     Past Surgical History:  Procedure Laterality Date  . COLONOSCOPY  05/14/15   Dr. Collene Mares  . FRACTURE SURGERY  2001   Tibia & fibula  . TONSILLECTOMY  1996    There were Kennedy vitals filed for this visit.      Subjective Assessment - 03/30/17 0936    Subjective I am feeling good overall.  60-65% overall improvement.     Currently in Pain? Yes   Pain Score 3    Pain Location Shoulder   Pain Orientation Right   Pain Descriptors / Indicators Aching   Pain Type Acute pain   Pain Onset More than a month ago   Pain Frequency Intermittent   Aggravating Factors  lifting overhead, as the day progresses.   Pain Relieving Factors rest, early in the day, ibuprofen when it is really                         [facility] Adult PT  Treatment/Exercise - 03/30/17 0001      Neck Exercises: Machines for Strengthening   UBE (Upper Arm Bike) Level 0 in reverse x 6 minutes  verbal cues for postural stabilization     Shoulder Exercises: Supine   Protraction 20 reps;Weights   Protraction Weight (lbs) 3   Other Supine Exercises shoulder press hold 5 sec 10 times     Shoulder Exercises: Sidelying   External Rotation Right;20 reps;Strengthening   External Rotation Weight (lbs) 2     Shoulder Exercises: Standing   Horizontal ABduction Strengthening;Both;10 reps   Flexion Strengthening;Right;20 reps;Weights   Shoulder Flexion Weight (lbs) 1   ABduction Strengthening;20 reps;Right;Weights   Shoulder ABduction Weight (lbs) 1   ABduction Limitations scaption 1# 2x10   Other Standing Exercises UE ranger: flexion and abduction 2x10     Shoulder Exercises: ROM/Strengthening   Other ROM/Strengthening Exercises pully shoulder flexion - 3 min     Manual Therapy   Manual Therapy Soft tissue mobilization   Soft tissue mobilization right cervical paraspinals; right anterior shoulder, right interscapular area     Neck Exercises: Stretches   Upper Trapezius Stretch 3 reps;20 seconds   Upper Trapezius Stretch Limitations R only  PT Short Term Goals - 03/30/17 4235      PT SHORT TERM GOAL #3   Title able to put a pull over top with 25% greater ease due to decreased pain   Status Achieved     PT SHORT TERM GOAL #4   Title understand ways to sleep with right arm propped up and sitting with right arm propped up   Status Achieved           PT Long Term Goals - 03/30/17 3614      PT LONG TERM GOAL #1   Title indepedent with HEP   Period Weeks   Status On-going     PT LONG TERM GOAL #2   Title ability to take care of her grandchildren with 50% greater ease and minimal pain due to increased strength   Status Achieved     PT LONG TERM GOAL #3   Title ability to dress without difficulty due  to pain miminal   Time 8   Period Weeks   Status On-going               Plan - 03/30/17 4315    Clinical Impression Statement Pt reports 60-65% overall improvement in symptoms since the start of care.  Pt reports 3/10 Rt shoulder pain in the early part of the day and pain increases with use.  Pt reports increased discomfort in eccentric motion.  Pt able to perfrom all exercises without increased pain. Pt with continued scapular weakness and will continued to benefit from skilled PT for strength and flexibility.     PT Frequency 2x / week   PT Duration 8 weeks   PT Treatment/Interventions Electrical Stimulation;Cryotherapy;Iontophoresis 4mg /ml Dexamethasone;Ultrasound;Moist Heat;Therapeutic activities;Therapeutic exercise;Neuromuscular re-education;Patient/family education;Passive range of motion;Manual techniques;Dry needling;Energy conservation;Taping   PT Next Visit Plan stabilization exercises to right shoulder; soft tissue work to right shoulder and cervical; scapula stabilization; modalities as needed; Work on [employer] and Agree with Plan of Care Patient      Patient will benefit from skilled therapeutic intervention in order to improve the following deficits and impairments:  Increased muscle spasms, Decreased endurance, Decreased activity tolerance, Pain, Decreased strength  Visit Diagnosis: Cervicalgia  Acute pain of right shoulder  Muscle weakness (generalized)     Problem List Patient Active Problem List   Diagnosis Date Noted  . Type 2 diabetes mellitus without complication, without long-term current use of insulin ([place]) 01/14/2016  . Essential hypertension 01/14/2016  . Hyperlipidemia 01/14/2016  . Estrogen deficiency 05/17/2014  . Encounter for screening mammogram for breast cancer 05/17/2014  . Colon cancer screening 05/17/2014  . Hyperlipidemia LDL goal <100 05/17/2014  . Breast cancer screening 05/17/2014  . IBS (irritable bowel syndrome)  01/26/2013  . Other and unspecified hyperlipidemia 01/26/2013  . Vitamin D deficiency 01/05/2013  . Other malaise and fatigue 01/05/2013    Sigurd Sos, PT 03/30/17 10:11 AM  [facility] [address] [place], [place], [address] Phone: [phone]   Fax:  [phone]  Name: Joann Kennedy MRN: [id number] Date of Birth: 08-04-1946

## 2017-04-01 ENCOUNTER — Ambulatory Visit: Payer: Medicare Other

## 2017-04-01 DIAGNOSIS — M25511 Pain in right shoulder: Secondary | ICD-10-CM

## 2017-04-01 DIAGNOSIS — M542 Cervicalgia: Secondary | ICD-10-CM

## 2017-04-01 DIAGNOSIS — M6281 Muscle weakness (generalized): Secondary | ICD-10-CM

## 2017-04-01 NOTE — Therapy (Signed)
[facility] [address] [place], [place], [address] Phone: [phone]   Fax:  [phone]  Physical Therapy Treatment  Patient Details  Name: Joann Kennedy MRN: [id number] Date of Birth: Dec 12, 1945 Referring Provider: Dr. Hollace Kinnier  Encounter Date: 04/01/2017      PT End of Session - 04/01/17 1014    Visit Number 8   Number of Visits 10   Date for PT Re-Evaluation 04/29/17   Authorization Type medicare g-code on 10th visit   PT Start Time 0930   PT Stop Time 1014   PT Time Calculation (min) 44 min   Activity Tolerance Patient tolerated treatment well   Behavior During Therapy [facility] for tasks assessed/performed      Past Medical History:  Diagnosis Date  . Disorder of bone and cartilage, unspecified   . Dizziness and giddiness   . Hypercalcemia   . Hyperlipidemia   . Hypertension   . Hypopotassemia   . Other malaise and fatigue   . Other specified disease of white blood cells   . Special screening for malignant neoplasms, colon   . Type II or unspecified type diabetes mellitus without mention of complication, not stated as uncontrolled   . Unspecified vitamin D deficiency     Past Surgical History:  Procedure Laterality Date  . COLONOSCOPY  05/14/15   Dr. Collene Mares  . FRACTURE SURGERY  2001   Tibia & fibula  . TONSILLECTOMY  1996    There were no vitals filed for this visit.      Subjective Assessment - 04/01/17 0941    Subjective I woke up this morning with increased Rt shoulder pain- maybe I slept on it wrong.     Diagnostic tests MRI shows significant tears of biceps and supraspinatus tendons; arthritis of the shoulder joint.   Currently in Pain? Yes   Pain Score 5    Pain Location Shoulder   Pain Orientation Right                         [facility] Adult PT Treatment/Exercise - 04/01/17 0001      Neck Exercises: Machines for Strengthening   UBE (Upper Arm Bike) Level 0 in  reverse x 6 minutes  verbal cues for postural stabilization     Shoulder Exercises: Supine   Protraction 20 reps;Weights   Protraction Weight (lbs) 3   Other Supine Exercises shoulder press hold 5 sec 10 times     Shoulder Exercises: Sidelying   External Rotation Right;20 reps;Strengthening   External Rotation Weight (lbs) 2     Shoulder Exercises: Standing   Horizontal ABduction Strengthening;Both;10 reps   Flexion Strengthening;Right;20 reps;Weights   Shoulder Flexion Weight (lbs) 1   ABduction Strengthening;20 reps;Right;Weights   Shoulder ABduction Weight (lbs) 1   ABduction Limitations scaption 1# 2x10   Other Standing Exercises UE ranger: flexion and abduction 2x10     Shoulder Exercises: ROM/Strengthening   Other ROM/Strengthening Exercises pully shoulder flexion - 3 min     Modalities   Modalities Ultrasound     Ultrasound   Ultrasound Location Rt shoulder   Ultrasound Parameters 1.2 w/cm2 50% pulsed x 8 minutes   Ultrasound Goals Pain                  PT Short Term Goals - 03/30/17 4166      PT SHORT TERM GOAL #3   Title able to put a pull over top  with 25% greater ease due to decreased pain   Status Achieved     PT SHORT TERM GOAL #4   Title understand ways to sleep with right arm propped up and sitting with right arm propped up   Status Achieved           PT Long Term Goals - 03/30/17 7096      PT LONG TERM GOAL #1   Title indepedent with HEP   Period Weeks   Status On-going     PT LONG TERM GOAL #2   Title ability to take care of her grandchildren with 50% greater ease and minimal pain due to increased strength   Status Achieved     PT LONG TERM GOAL #3   Title ability to dress without difficulty due to pain miminal   Time 8   Period Weeks   Status On-going               Plan - 04/01/17 0950    Clinical Impression Statement Pt reports 60-65% overall improvement in Rt shoulder symptoms since the start of care.  Pt with  increase pain upon waking today.  Pt able to perform all exercise in the clinic without limitation today.  Pt with scapular weakness and requires verbal and tactile cues for technique with exercises.  Pt will continue to benefit from skilled PT for glenohumeral joint stabilization and scapular strength due to significant Rt RTC tear.     Rehab Potential Excellent   PT Frequency 2x / week   PT Duration 8 weeks   PT Treatment/Interventions Electrical Stimulation;Cryotherapy;Iontophoresis 4mg /ml Dexamethasone;Ultrasound;Moist Heat;Therapeutic activities;Therapeutic exercise;Neuromuscular re-education;Patient/family education;Passive range of motion;Manual techniques;Dry needling;Energy conservation;Taping   PT Next Visit Plan stabilization exercises to right shoulder; soft tissue work to right shoulder and cervical; scapula stabilization; modalities as needed   Consulted and Agree with Plan of Care Patient      Patient will benefit from skilled therapeutic intervention in order to improve the following deficits and impairments:  Increased muscle spasms, Decreased endurance, Decreased activity tolerance, Pain, Decreased strength  Visit Diagnosis: Cervicalgia  Acute pain of right shoulder  Muscle weakness (generalized)     Problem List Patient Active Problem List   Diagnosis Date Noted  . Type 2 diabetes mellitus without complication, without long-term current use of insulin ([place]) 01/14/2016  . Essential hypertension 01/14/2016  . Hyperlipidemia 01/14/2016  . Estrogen deficiency 05/17/2014  . Encounter for screening mammogram for breast cancer 05/17/2014  . Colon cancer screening 05/17/2014  . Hyperlipidemia LDL goal <100 05/17/2014  . Breast cancer screening 05/17/2014  . IBS (irritable bowel syndrome) 01/26/2013  . Other and unspecified hyperlipidemia 01/26/2013  . Vitamin D deficiency 01/05/2013  . Other malaise and fatigue 01/05/2013    Sigurd Sos, PT 04/01/17 10:15  AM  [facility] [address], [place], [place], [address] Phone: [phone]   Fax:  [phone]  Name: Raeonna Milo MRN: [id number] Date of Birth: 1946/01/25

## 2017-04-20 ENCOUNTER — Encounter: Payer: Medicare Other | Admitting: Physical Therapy

## 2017-04-22 ENCOUNTER — Telehealth: Payer: Self-pay | Admitting: Physical Therapy

## 2017-04-22 ENCOUNTER — Ambulatory Visit: Payer: Medicare Other | Attending: Internal Medicine | Admitting: Physical Therapy

## 2017-04-22 DIAGNOSIS — M542 Cervicalgia: Secondary | ICD-10-CM | POA: Insufficient documentation

## 2017-04-22 DIAGNOSIS — M25511 Pain in right shoulder: Secondary | ICD-10-CM | POA: Insufficient documentation

## 2017-04-22 DIAGNOSIS — M6281 Muscle weakness (generalized): Secondary | ICD-10-CM | POA: Insufficient documentation

## 2017-04-22 NOTE — Telephone Encounter (Signed)
Called patient and she reported she cancel prior to appt.  Earlie Counts, PT @9 /19/2018@ 9:01 AM

## 2017-04-27 ENCOUNTER — Encounter: Payer: Self-pay | Admitting: Physical Therapy

## 2017-04-27 ENCOUNTER — Ambulatory Visit: Payer: Medicare Other | Admitting: Physical Therapy

## 2017-04-27 DIAGNOSIS — M25511 Pain in right shoulder: Secondary | ICD-10-CM | POA: Diagnosis not present

## 2017-04-27 DIAGNOSIS — M6281 Muscle weakness (generalized): Secondary | ICD-10-CM

## 2017-04-27 DIAGNOSIS — M542 Cervicalgia: Secondary | ICD-10-CM | POA: Diagnosis not present

## 2017-04-27 NOTE — Patient Instructions (Addendum)
PNF Strengthening: Resisted   Lay on back with resistive band around each hand, bring right arm up and away, thumb back. Repeat _10___ times per set. Do _2___ sets per session. Do _1-2___ sessions per day.      Resisted Horizontal Abduction: Bilateral   Lay on back, tubing in both hands, arms out in front. Keeping arms straight, pinch shoulder blades together and stretch arms out. Repeat _10___ times per set. Do 2____ sets per session. Do _1-2___ sessions per day.   Scapular Retraction: Elbow Flexion (Standing)   Lay on back with red band in hands. With elbows bent to 90, pinch shoulder blades together and rotate arms out, keeping elbows bent. Repeat _10___ times per set. Do _1___ sets per session. Do many____ sessions per day.  Posture - Standing   Good posture is important. Avoid slouching and forward head thrust. Maintain curve in low back and align ears over shoulders, hips over ankles.  Pull your belly button in toward your back bone. Posture Tips DO: - stand tall and erect - keep chin tucked in - keep head and shoulders in alignment - check posture regularly in mirror or large window - pull head back against headrest in car seat;  Change your position often.  Sit with lumbar support. DON'T: - slouch or slump while watching TV or reading - sit, stand or lie in one position  for too long;  Sitting is especially hard on the spine so if you sit at a desk/use the computer, then stand up often! Copyright  VHI. All rights reserved.  Posture - Sitting  Sit upright, head facing forward. Try using a roll to support lower back. Keep shoulders relaxed, and avoid rounded back. Keep hips level with knees. Avoid crossing legs for long periods. Copyright  VHI. All rights reserved.  Chronic neck strain can develop because of poor posture and faulty work habits  Postural strain related to slumped sitting and forward head posture is a leading cause of headaches, neck and upper  back pain  General strengthening and flexibility exercises are helpful in the treatment of neck pain.  Most importantly, you should learn to correct the posture that may be contributing to chronic pain.   Change positions frequently Change your work or home environment to improve posture and mechanics. [place] [facility] [address] [place], [place] [address] Phone # [phone] Fax [phone]

## 2017-04-27 NOTE — Therapy (Addendum)
[facility] [address] [place], [place], [address] Phone: [phone]   Fax:  [phone]  Physical Therapy Treatment  Patient Details  Name: Joann Kennedy MRN: [id number] Date of Birth: 11-Aug-1945 Referring Provider: Dr. Hollace Kinnier  Encounter Date: 04/27/2017      PT End of Session - 04/27/17 0909    Visit Number 9   Number of Visits 10   Date for PT Re-Evaluation 04/29/17   Authorization Type medicare g-code on 10th visit   PT Start Time 0848   PT Stop Time 0918   PT Time Calculation (min) 30 min   Activity Tolerance Patient tolerated treatment well   Behavior During Therapy [facility] for tasks assessed/performed      Past Medical History:  Diagnosis Date  . Disorder of bone and cartilage, unspecified   . Dizziness and giddiness   . Hypercalcemia   . Hyperlipidemia   . Hypertension   . Hypopotassemia   . Other malaise and fatigue   . Other specified disease of white blood cells   . Special screening for malignant neoplasms, colon   . Type II or unspecified type diabetes mellitus without mention of complication, not stated as uncontrolled   . Unspecified vitamin D deficiency     Past Surgical History:  Procedure Laterality Date  . COLONOSCOPY  05/14/15   Dr. Collene Mares  . FRACTURE SURGERY  2001   Tibia & fibula  . TONSILLECTOMY  1996    There were no vitals filed for this visit.      Subjective Assessment - 04/27/17 0851    Subjective I feel better.  I worked 1/2 hour in the yard. No increase in pain after yardwork.    Diagnostic tests MRI shows significant tears of biceps and supraspinatus tendons; arthritis of the shoulder joint.   Patient Stated Goals reduce pain   Currently in Pain? No/denies            [facility] PT Assessment - 04/27/17 0001      Assessment   Medical Diagnosis S49.91XD Injury of right shoulder, subsequent encounter   Referring Provider Dr. Hollace Kinnier   Onset  Date/Surgical Date 09/04/16   Hand Dominance Right   Prior Therapy none     Precautions   Precautions None;Other (comment)   Precaution Comments significant tear of the right biceps and supraspinatus tendon     Restrictions   Weight Bearing Restrictions No     Balance Screen   Has the patient fallen in the past 6 months No   Has the patient had a decrease in activity level because of a fear of falling?  No   Is the patient reluctant to leave their home because of a fear of falling?  No     Home Ecologist residence     Prior Function   Level of Independence Independent     Cognition   Overall Cognitive Status Within Functional Limits for tasks assessed     Observation/Other Assessments   Focus on Therapeutic Outcomes (FOTO)  27% limitation     Posture/Postural Control   Posture/Postural Control No significant limitations     AROM   Right Shoulder Flexion 175 Degrees   Right Shoulder ABduction 180 Degrees     Strength   Right Shoulder ABduction 5/5   Right Shoulder Internal Rotation 5/5   Right Shoulder External Rotation 4/5     Transfers   Transfers Not assessed  Ambulation/Gait   Ambulation/Gait No                     [facility] Adult PT Treatment/Exercise - May 26, 2017 0001      Therapeutic Activites    Therapeutic Activities Other Therapeutic Activities   Other Therapeutic Activities discussed posture with gardening, cross stitch, sitting and lifting     Shoulder Exercises: ROM/Strengthening   Other ROM/Strengthening Exercises pully shoulder flexion - 3 min and abduction 97mn                PT Education - 010/23/180913    Education provided Yes   Education Details reviewed posture and HEP for shoulder. Discussed her posture with cross stitch and when to take a break   Person(s) Educated Patient   Methods Explanation;Demonstration;Verbal cues;Handout   Comprehension Returned demonstration;Verbalized  understanding          PT Short Term Goals - 03/30/17 01194     PT SHORT TERM GOAL #3   Title able to put a pull over top with 25% greater ease due to decreased pain   Status Achieved     PT SHORT TERM GOAL #4   Title understand ways to sleep with right arm propped up and sitting with right arm propped up   Status Achieved           PT Long Term Goals - 02018/10/230855      PT LONG TERM GOAL #1   Title indepedent with HEP   Time 8   Status Achieved     PT LONG TERM GOAL #2   Title ability to take care of her grandchildren with 50% greater ease and minimal pain due to increased strength   Time 8   Period Weeks   Status Achieved     PT LONG TERM GOAL #3   Title ability to dress without difficulty due to pain miminal   Time 8   Period Weeks   Status Achieved     PT LONG TERM GOAL #4   Status Achieved     PT LONG TERM GOAL #5   Title FOTO score </= 34% limitation   Time 8   Period Weeks   Status Achieved               Plan - 010/23/20180920    Clinical Impression Statement Patient has full right shoulder AROM and strength except right shoulder external rotation 4/5.  Patient reports sometimes neck pain.  Therapist instructed patient on stretches and posture with her cross stitching to reduce neck pain.  Patient has full cervical ROM.  Patient has met all of her goals.  Patient is ready for discharge.    Rehab Potential Excellent   PT Treatment/Interventions Electrical Stimulation;Cryotherapy;Iontophoresis 463mml Dexamethasone;Ultrasound;Moist Heat;Therapeutic activities;Therapeutic exercise;Neuromuscular re-education;Patient/family education;Passive range of motion;Manual techniques;Dry needling;Energy conservation;Taping   PT Next Visit Plan Discharge to current HEP today   PT Home Exercise Plan Current HEP   Consulted and Agree with Plan of Care Patient      Patient will benefit from skilled therapeutic intervention in order to improve the following  deficits and impairments:  Increased muscle spasms, Decreased endurance, Decreased activity tolerance, Pain, Decreased strength  Visit Diagnosis: Cervicalgia  Acute pain of right shoulder  Muscle weakness (generalized)       G-Codes - 092018-10-23908    Functional Assessment Tool Used (Outpatient Only) FOTO score is 27% limitation   Functional Limitation Mobility: Walking and moving around  Mobility: Walking and Moving Around Goal Status [phone]) At least 20 percent but less than 40 percent impaired, limited or restricted   Mobility: Walking and Moving Around Discharge Status [phone]) At least 20 percent but less than 40 percent impaired, limited or restricted      Problem List Patient Active Problem List   Diagnosis Date Noted  . Type 2 diabetes mellitus without complication, without long-term current use of insulin ([place]) 01/14/2016  . Essential hypertension 01/14/2016  . Hyperlipidemia 01/14/2016  . Estrogen deficiency 05/17/2014  . Encounter for screening mammogram for breast cancer 05/17/2014  . Colon cancer screening 05/17/2014  . Hyperlipidemia LDL goal <100 05/17/2014  . Breast cancer screening 05/17/2014  . IBS (irritable bowel syndrome) 01/26/2013  . Other and unspecified hyperlipidemia 01/26/2013  . Vitamin D deficiency 01/05/2013  . Other malaise and fatigue 01/05/2013    Earlie Counts, PT 04/27/17 9:24 AM   [facility] [address] [place], [place], [address] Phone: [phone]   Fax:  [phone]  Name: Mariadelcarmen Corella MRN: [id number] Date of Birth: 1945/08/11 PHYSICAL THERAPY DISCHARGE SUMMARY  Visits from Start of Care: 9  Current functional level related to goals / functional outcomes: See above.    Remaining deficits: See above.    Education / Equipment: HEP Plan: Patient agrees to discharge.  Patient goals were met. Patient is being discharged due to meeting the stated rehab goals.  Thank you for the referral Earlie Counts, PT 08/27/17 4:38 PM   ?????

## 2017-04-29 ENCOUNTER — Ambulatory Visit: Payer: Medicare Other | Admitting: Physical Therapy

## 2017-06-08 ENCOUNTER — Other Ambulatory Visit: Payer: Self-pay | Admitting: Internal Medicine

## 2017-06-08 DIAGNOSIS — E119 Type 2 diabetes mellitus without complications: Secondary | ICD-10-CM

## 2017-07-02 ENCOUNTER — Encounter (HOSPITAL_COMMUNITY): Payer: Self-pay | Admitting: Emergency Medicine

## 2017-07-02 ENCOUNTER — Other Ambulatory Visit: Payer: Self-pay

## 2017-07-02 DIAGNOSIS — I10 Essential (primary) hypertension: Secondary | ICD-10-CM | POA: Insufficient documentation

## 2017-07-02 DIAGNOSIS — Z7902 Long term (current) use of antithrombotics/antiplatelets: Secondary | ICD-10-CM | POA: Insufficient documentation

## 2017-07-02 DIAGNOSIS — Z7984 Long term (current) use of oral hypoglycemic drugs: Secondary | ICD-10-CM | POA: Diagnosis not present

## 2017-07-02 DIAGNOSIS — E119 Type 2 diabetes mellitus without complications: Secondary | ICD-10-CM | POA: Diagnosis not present

## 2017-07-02 DIAGNOSIS — R4781 Slurred speech: Secondary | ICD-10-CM | POA: Diagnosis not present

## 2017-07-02 DIAGNOSIS — G459 Transient cerebral ischemic attack, unspecified: Secondary | ICD-10-CM | POA: Insufficient documentation

## 2017-07-02 DIAGNOSIS — Z79899 Other long term (current) drug therapy: Secondary | ICD-10-CM | POA: Diagnosis not present

## 2017-07-02 DIAGNOSIS — R9431 Abnormal electrocardiogram [ECG] [EKG]: Secondary | ICD-10-CM | POA: Diagnosis not present

## 2017-07-02 DIAGNOSIS — R51 Headache: Secondary | ICD-10-CM | POA: Diagnosis present

## 2017-07-02 DIAGNOSIS — R42 Dizziness and giddiness: Secondary | ICD-10-CM | POA: Diagnosis not present

## 2017-07-02 NOTE — ED Triage Notes (Signed)
Pt states today around 1 or 2 in the afternoon she experienced slurred speech, could not lift her right arm, and dizziness with headache  Pt states all symptoms have resolved except for her headache  No neuro deficits noted at this time

## 2017-07-03 ENCOUNTER — Emergency Department (HOSPITAL_COMMUNITY)
Admission: EM | Admit: 2017-07-03 | Discharge: 2017-07-03 | Disposition: A | Discharge status: Home / Self Care | Payer: Medicare Other | Attending: Emergency Medicine | Admitting: Emergency Medicine

## 2017-07-03 ENCOUNTER — Emergency Department (HOSPITAL_COMMUNITY): Payer: Medicare Other

## 2017-07-03 DIAGNOSIS — G459 Transient cerebral ischemic attack, unspecified: Secondary | ICD-10-CM | POA: Diagnosis not present

## 2017-07-03 DIAGNOSIS — D473 Essential (hemorrhagic) thrombocythemia: Secondary | ICD-10-CM | POA: Diagnosis not present

## 2017-07-03 DIAGNOSIS — D72829 Elevated white blood cell count, unspecified: Secondary | ICD-10-CM | POA: Diagnosis not present

## 2017-07-03 DIAGNOSIS — R42 Dizziness and giddiness: Secondary | ICD-10-CM | POA: Diagnosis not present

## 2017-07-03 DIAGNOSIS — R4781 Slurred speech: Secondary | ICD-10-CM | POA: Diagnosis not present

## 2017-07-03 DIAGNOSIS — D72820 Lymphocytosis (symptomatic): Secondary | ICD-10-CM | POA: Diagnosis not present

## 2017-07-03 LAB — CBC
HEMATOCRIT: 36.4 % (ref 36.0–46.0)
HEMOGLOBIN: 12.8 g/dL (ref 12.0–15.0)
MCH: 30.4 pg (ref 26.0–34.0)
MCHC: 35.2 g/dL (ref 30.0–36.0)
MCV: 86.5 fL (ref 78.0–100.0)
Platelets: 453 10*3/uL — ABNORMAL HIGH (ref 150–400)
RBC: 4.21 MIL/uL (ref 3.87–5.11)
RDW: 13.3 % (ref 11.5–15.5)
WBC: 12.7 10*3/uL — ABNORMAL HIGH (ref 4.0–10.5)

## 2017-07-03 LAB — COMPREHENSIVE METABOLIC PANEL
ALK PHOS: 83 U/L (ref 38–126)
ALT: 23 U/L (ref 14–54)
ANION GAP: 9 (ref 5–15)
AST: 19 U/L (ref 15–41)
Albumin: 4.4 g/dL (ref 3.5–5.0)
BILIRUBIN TOTAL: 0.5 mg/dL (ref 0.3–1.2)
BUN: 17 mg/dL (ref 6–20)
CALCIUM: 9.6 mg/dL (ref 8.9–10.3)
CO2: 26 mmol/L (ref 22–32)
CREATININE: 0.67 mg/dL (ref 0.44–1.00)
Chloride: 103 mmol/L (ref 101–111)
Glucose, Bld: 125 mg/dL — ABNORMAL HIGH (ref 65–99)
Potassium: 3.3 mmol/L — ABNORMAL LOW (ref 3.5–5.1)
Sodium: 138 mmol/L (ref 135–145)
TOTAL PROTEIN: 7.7 g/dL (ref 6.5–8.1)

## 2017-07-03 LAB — PROTIME-INR
INR: 0.89
Prothrombin Time: 11.9 seconds (ref 11.4–15.2)

## 2017-07-03 LAB — DIFFERENTIAL
BASOS PCT: 0 %
Basophils Absolute: 0 10*3/uL (ref 0.0–0.1)
EOS PCT: 2 %
Eosinophils Absolute: 0.3 10*3/uL (ref 0.0–0.7)
LYMPHS ABS: 6.4 10*3/uL — AB (ref 0.7–4.0)
LYMPHS PCT: 51 %
MONOS PCT: 6 %
Monocytes Absolute: 0.8 10*3/uL (ref 0.1–1.0)
NEUTROS ABS: 5.2 10*3/uL (ref 1.7–7.7)
NEUTROS PCT: 41 %

## 2017-07-03 LAB — PATHOLOGIST SMEAR REVIEW

## 2017-07-03 MED ORDER — ASPIRIN EC 325 MG PO TBEC: 325.0000 mg | DELAYED_RELEASE_TABLET | Freq: Every day | ORAL | 0 refills | Status: AC

## 2017-07-03 NOTE — ED Notes (Signed)
Pt is hard stick, will try again when back from CT

## 2017-07-03 NOTE — ED Notes (Signed)
Patient transported to CT 

## 2017-07-03 NOTE — ED Provider Notes (Signed)
[facility] DEPT Provider Note   CSN: [id number] Arrival date & time: 07/02/17  2152     History   Chief Complaint Chief Complaint  Patient presents with  . Headache    HPI Joann Kennedy is a 71 y.o. female.  The history is provided by the patient and the spouse.  Headache   This is a new problem. The current episode started 12 to 24 hours ago. The problem occurs constantly. The problem has been gradually improving. The headache is associated with nothing. The pain is moderate. Pertinent negatives include no fever, no shortness of breath and no vomiting. She has tried nothing for the symptoms.  Patient with history of hyperlipidemia, hypertension, presents with onset of headache and right arm weakness as well as difficulty speaking at approximately 12 PM on November 29 This episode lasted for approximately 5-10 minutes. She also had associated headache at that time  She reports her headache is improved, she reports all of her other symptoms have improved  She denies any fever vomiting, chest pain, shortness of breath, abdominal pain She denies any history of stroke    Past Medical History:  Diagnosis Date  . Disorder of bone and cartilage, unspecified   . Dizziness and giddiness   . Hypercalcemia   . Hyperlipidemia   . Hypertension   . Hypopotassemia   . Other malaise and fatigue   . Other specified disease of white blood cells   . Special screening for malignant neoplasms, colon   . Type II or unspecified type diabetes mellitus without mention of complication, not stated as uncontrolled   . Unspecified vitamin D deficiency     Patient Active Problem List   Diagnosis Date Noted  . Type 2 diabetes mellitus without complication, without long-term current use of insulin ([place]) 01/14/2016  . Essential hypertension 01/14/2016  . Hyperlipidemia 01/14/2016  . Estrogen deficiency 05/17/2014  . Encounter for screening mammogram for breast  cancer 05/17/2014  . Colon cancer screening 05/17/2014  . Hyperlipidemia LDL goal <100 05/17/2014  . Breast cancer screening 05/17/2014  . IBS (irritable bowel syndrome) 01/26/2013  . Other and unspecified hyperlipidemia 01/26/2013  . Vitamin D deficiency 01/05/2013  . Other malaise and fatigue 01/05/2013    Past Surgical History:  Procedure Laterality Date  . COLONOSCOPY  05/14/15   Dr. Collene Mares  . FRACTURE SURGERY  2001   Tibia & fibula  . TONSILLECTOMY  1996    OB History    No data available       Home Medications    Prior to Admission medications   Medication Sig Start Date End Date Taking? Authorizing Provider  benazepril-hydrochlorthiazide (LOTENSIN HCT) 20-12.5 MG tablet take 1 tablet by mouth once daily 10/13/16  Yes Reed, Tiffany L, DO  Calcium Carbonate-Vitamin D 600-400 MG-UNIT tablet Take 1 tablet by mouth daily. 04/21/16  Yes Reed, Tiffany L, DO  empagliflozin (JARDIANCE) 25 MG TABS tablet Take 25 mg by mouth daily. 12/15/16  Yes Reed, Tiffany L, DO  omeprazole (PRILOSEC) 40 MG capsule Take 40 mg by mouth daily. Reported on 09/10/2015   Yes [provider]  pravastatin (PRAVACHOL) 80 MG tablet Take 1 tablet (80 mg total) by mouth daily. 12/15/16  Yes Reed, Tiffany L, DO  TRADJENTA 5 MG TABS tablet take 1 tablet by mouth once daily 06/08/17  Yes Reed, Tiffany L, DO  glucose blood (FREESTYLE LITE) test strip Use as instructed to test blood sugar once daily dx E11.9 03/31/16  Reed, Tiffany L, DO  Lancets (FREESTYLE) lancets TEST as directed 11/20/14   Lauree Chandler, NP    Family History Family History  Problem Relation Age of Onset  . Diabetes Mother   . Heart disease Mother   . Heart disease Brother     Social History Social History   Tobacco Use  . Smoking status: Never Smoker  . Smokeless tobacco: Never Used  Substance Use Topics  . Alcohol use: No  . Drug use: No     Allergies   Metformin and related   Review of Systems Review of  Systems  Constitutional: Negative for fever.  Respiratory: Negative for shortness of breath.   Cardiovascular: Negative for chest pain.  Gastrointestinal: Negative for vomiting.  Neurological: Positive for dizziness and headaches.  All other systems reviewed and are negative.    Physical Exam Updated Vital Signs BP (!) 152/100 (BP Location: Left Arm)   Pulse 83   Temp 98 F (36.7 C) (Oral)   Resp 18   SpO2 99%   Physical Exam CONSTITUTIONAL: Well developed/well nourished HEAD: Normocephalic/atraumatic EYES: EOMI/PERRL, no nystagmus ENMT: Mucous membranes moist NECK: supple no meningeal signs, no bruits CV: S1/S2 noted, no murmurs/rubs/gallops noted LUNGS: Lungs are clear to auscultation bilaterally, no apparent distress ABDOMEN: soft, nontender, no rebound or guarding GU:no cva tenderness NEURO:Awake/alert, face symmetric, no arm or leg drift is noted Equal 5/5 strength with shoulder abduction, elbow flex/extension, wrist flex/extension in upper extremities and equal hand grips bilaterally Equal 5/5 strength with hip flexion,knee flex/extension, foot dorsi/plantar flexion Cranial nerves 3/4/5/6/02/09/09/11/12 tested and intact Gait normal without ataxia No past pointing Sensation to light touch intact in all extremities EXTREMITIES: pulses normal, full ROM SKIN: warm, color normal PSYCH: no abnormalities of mood noted   ED Treatments / Results  Labs (all labs ordered are listed, but only abnormal results are displayed) Labs Reviewed  CBC - Abnormal; Notable for the following components:      Result Value   WBC 12.7 (*)    Platelets 453 (*)    All other components within normal limits  DIFFERENTIAL - Abnormal; Notable for the following components:   Lymphs Abs 6.4 (*)    All other components within normal limits  COMPREHENSIVE METABOLIC PANEL - Abnormal; Notable for the following components:   Potassium 3.3 (*)    Glucose, Bld 125 (*)    All other components  within normal limits  PROTIME-INR  PATHOLOGIST SMEAR REVIEW    EKG ED ECG REPORT   Date: 07/03/2017 1324  Rate: 59  Rhythm: normal sinus rhythm  QRS Axis: normal  Intervals: normal  ST/T Wave abnormalities: normal  Conduction Disutrbances:none  I have personally reviewed the EKG tracing and agree with the computerized printout as noted.  Radiology Ct Head Wo Contrast  Result Date: 07/03/2017 CLINICAL DATA:  Focal neurologic deficit greater than 6 hours. Slurred speech, right arm numbness, onset 12 hours prior. Dizziness and headache. EXAM: CT HEAD WITHOUT CONTRAST TECHNIQUE: Contiguous axial images were obtained from the base of the skull through the vertex without intravenous contrast. COMPARISON:  None. FINDINGS: Brain: Normal for age atrophy. Mild chronic small vessel ischemia. No intracranial hemorrhage, mass effect, or midline shift. No hydrocephalus. The basilar cisterns are patent. No evidence of territorial infarct or acute ischemia. No extra-axial or intracranial fluid collection. Vascular: Atherosclerosis of skullbase vasculature without hyperdense vessel or abnormal calcification. Skull: No fracture or focal lesion. Sinuses/Orbits: Trace left maxillary sinus mucosal thickening. No fluid levels. Mastoid  air cells are clear. Other: None. IMPRESSION: Mild atrophy and chronic small vessel ischemia. No CT findings of acute abnormality. Electronically Signed   By: Jeb Levering M.D.   On: 07/03/2017 02:47    Procedures Procedures (including critical care time)  Medications Ordered in ED Medications - No data to display   Initial Impression / Assessment and Plan / ED Course  I have reviewed the triage vital signs and the nursing notes.  Pertinent labs & imaging results that were available during my care of the patient were reviewed by me and considered in my medical decision making (see chart for details).     Patient is a very pleasant, well-appearing patient who had an  episode of weakness earlier in the day All Of her weakness is resolved, there is no signs of acute stroke at this time However, I am concerned for possible TIA  After long discussion with patient and spouse, we agreed to proceed with CT imaging of head as well as laboratory evaluation  However patient is resistant to being admitted due to the fact that she had a religious service this weekend as well as hosting multiple family members.  At this point she will decline admission but will go forward with ER workup  tPA in stroke considered but not given due to: Onset over 3-4.5hours, symptoms resolved  4:57 AM Patient stable, no new symptoms We discussed CT findings I spoke To her daughter via phone who is a Industrial/product designer in [place] Patient gave me permission to speak to her daughter Rockey Situ the daughter that I advised admission for TIA workup, however the patient is resistant to be admitted I advised to take aspirin daily follow-up with PCP next week for full TIA workup  Told patient that if she has any return of symptoms including headache weakness difficulty speaking she was called 911 and return to the ER immediately Final Clinical Impressions(s) / ED Diagnoses   Final diagnoses:  TIA (transient ischemic attack)    ED Discharge Orders        Ordered    aspirin EC 325 MG tablet  Daily     07/03/17 0454       Ripley Fraise, MD 07/03/17 [phone]

## 2017-07-03 NOTE — Discharge Instructions (Signed)
Please follow-up with your primary doctor next week Your CAT scan of your head tonight was normal I think you will need a TIA/mini stroke workup, which will include echocardiogram, carotid ultrasound as an outpatient  If you have any return of the symptoms with weakness and difficulty speaking please call 911 immediately to return to the ER

## 2017-07-06 ENCOUNTER — Other Ambulatory Visit: Payer: Medicare Other

## 2017-07-08 ENCOUNTER — Other Ambulatory Visit: Payer: Medicare Other

## 2017-07-08 DIAGNOSIS — E119 Type 2 diabetes mellitus without complications: Secondary | ICD-10-CM | POA: Diagnosis not present

## 2017-07-08 DIAGNOSIS — E782 Mixed hyperlipidemia: Secondary | ICD-10-CM

## 2017-07-09 LAB — LIPID PANEL
Cholesterol: 150 mg/dL (ref <200)
HDL: 63 mg/dL (ref >50)
LDL Cholesterol (Calc): 70 mg/dL (calc)
Non-HDL Cholesterol (Calc): 87 mg/dL (calc) (ref <130)
Total CHOL/HDL Ratio: 2.4 (calc) (ref <5.0)
Triglycerides: 89 mg/dL (ref <150)

## 2017-07-09 LAB — BASIC METABOLIC PANEL
BUN: 24 mg/dL (ref 7–25)
CO2: 26 mmol/L (ref 20–32)
Calcium: 9.3 mg/dL (ref 8.6–10.4)
Chloride: 107 mmol/L (ref 98–110)
Creat: 0.69 mg/dL (ref 0.50–0.99)
Glucose, Bld: 132 mg/dL — ABNORMAL HIGH (ref 65–99)
Potassium: 4.2 mmol/L (ref 3.5–5.3)
Sodium: 141 mmol/L (ref 135–146)

## 2017-07-09 LAB — HEMOGLOBIN A1C
Hgb A1c MFr Bld: 7.6 % of total Hgb — ABNORMAL HIGH (ref <5.7)
Mean Plasma Glucose: 171 (calc)
eAG (mmol/L): 9.5 (calc)

## 2017-07-10 ENCOUNTER — Encounter: Payer: Self-pay | Admitting: *Deleted

## 2017-07-13 ENCOUNTER — Ambulatory Visit: Payer: Medicare Other | Admitting: Internal Medicine

## 2017-07-20 ENCOUNTER — Encounter: Payer: Self-pay | Admitting: Internal Medicine

## 2017-07-20 ENCOUNTER — Ambulatory Visit (INDEPENDENT_AMBULATORY_CARE_PROVIDER_SITE_OTHER): Payer: Medicare Other | Admitting: Internal Medicine

## 2017-07-20 VITALS — Temp 98.1°F | Wt 125.0 lb

## 2017-07-20 DIAGNOSIS — I10 Essential (primary) hypertension: Secondary | ICD-10-CM

## 2017-07-20 DIAGNOSIS — Z23 Encounter for immunization: Secondary | ICD-10-CM

## 2017-07-20 DIAGNOSIS — E119 Type 2 diabetes mellitus without complications: Secondary | ICD-10-CM

## 2017-07-20 DIAGNOSIS — R42 Dizziness and giddiness: Secondary | ICD-10-CM | POA: Diagnosis not present

## 2017-07-20 DIAGNOSIS — E782 Mixed hyperlipidemia: Secondary | ICD-10-CM

## 2017-07-20 DIAGNOSIS — G459 Transient cerebral ischemic attack, unspecified: Secondary | ICD-10-CM | POA: Insufficient documentation

## 2017-07-20 MED ORDER — GLUCOSE BLOOD VI STRP: ORAL_STRIP | 1 refills | Status: DC

## 2017-07-20 MED ORDER — FREESTYLE LANCETS MISC: 1 refills | Status: DC

## 2017-07-20 NOTE — Addendum Note (Signed)
Addended by: Despina Hidden on: 07/20/2017 01:20 PM   Modules accepted: Orders

## 2017-07-20 NOTE — Patient Instructions (Addendum)
Drink plenty of water each day--8 8oz glasses of water.  This will help with dizziness on standing.  Also take pause when you first sit up and stand up to prevent falls.  Decrease your portions.  Try to eat smaller portions 6 times per day instead of large portions 3 times per day.   Add walking on the treadmill to your day.  These two changes should help to lower your sugar.  We will send in your prescriptions for your medications and diabetic supplies.  Please ask the pharmacy to fax [place] the form they need completed.     Ischemic Stroke An ischemic stroke is the sudden death of brain tissue. Blood carries oxygen to all areas of the body. This type of stroke happens when your blood does not flow to your brain like normal. Your brain cannot get the oxygen it needs. This is an emergency. It must be treated right away. Symptoms of a stroke usually happen all of a sudden. You may notice them when you wake up. They can include:  Weakness or loss of feeling in your face, arm, or leg. This often happens on one side of the body.  Trouble walking.  Trouble moving your arms or legs.  Loss of balance or coordination.  Feeling confused.  Trouble talking or understanding what people are saying.  Slurred speech.  Trouble seeing.  Seeing two of one object (double vision).  Feeling dizzy.  Feeling sick to your stomach (nauseous) and throwing up (vomiting).  A very bad headache for no reason.  Get help as soon as any of these problems start. This is important. Some treatments work better if they are given right away. These include:  Aspirin.  Medicines to control blood pressure.  A shot (injection) of medicine to break up the blood clot.  Treatments given in the blood vessel (artery) to take out the clot or break it up.  Other treatments may include:  Oxygen.  Fluids given through an IV tube.  Medicines to thin out your blood.  Procedures to help your blood flow better.  What  increases the risk? Certain things may make you more likely to have a stroke. Some of these are things that you can change, such as:  Being very overweight (obesity).  Smoking.  Taking birth control pills.  Not being active.  Drinking too much alcohol.  Using drugs.  Other risk factors include:  High blood pressure.  High cholesterol.  Diabetes.  Heart disease.  Being [place] American, Native American, Hispanic, or [place] Native.  Being over age 18.  Family history of stroke.  Having had blood clots, stroke, or warning stroke (transient ischemic attack, TIA) in the past.  Sickle cell disease.  Being a woman with a history of high blood pressure in pregnancy (preeclampsia).  Migraine headache.  Sleep apnea.  Having an irregular heartbeat (atrial fibrillation).  Long-term (chronic) diseases that cause soreness and swelling (inflammation).  Disorders that affect how your blood clots.  Follow these instructions at home: Medicines  Take over-the-counter and prescription medicines only as told by your doctor.  If you were told to take aspirin or another medicine to thin your blood, take it exactly as told by your doctor. ? Taking too much of the medicine can cause bleeding. ? If you do not take enough, it may not work as well.  Know the side effects of your medicines. If you are taking a blood thinner, make sure you: ? Hold pressure over any  cuts for longer than usual. ? Tell your dentist and other doctors that you take this medicine. ? Avoid activities that may cause damage or injury to your body. Eating and drinking  Follow instructions from your doctor about what you cannot eat or drink.  Eat healthy foods.  If you have trouble with swallowing, do these things to avoid choking: ? Take small bites when eating. ? Eat foods that are soft or pureed. Safety  Follow instructions from your health care team about physical activity.  Use a walker or cane  as told by your doctor.  Keep your home safe so you do not fall. This may include: ? Having experts look at your home to make sure it is safe. ? Putting grab bars in the bedroom and bathroom. ? Using raised toilets. ? Putting a seat in the shower. General instructions  Do not use any tobacco products. ? Examples of these are cigarettes, chewing tobacco, and e-cigarettes. ? If you need help quitting, ask your doctor.  Limit how much alcohol you drink. This means no more than 1 drink a day for nonpregnant women and 2 drinks a day for men. One drink equals 12 oz of beer, 5 oz of wine, or 1 oz of hard liquor.  If you need help to stop using drugs or alcohol, ask your doctor to refer you to a program or specialist.  Stay active. Exercise as told by your doctor.  Keep all follow-up visits as told by your doctor. This is important. Get help right away if:  You suddenly: ? Have weakness or loss of feeling in your face, arm, or leg. ? Feel confused. ? Have trouble talking or understanding what people are saying. ? Have trouble seeing. ? Have trouble walking. ? Have trouble moving your arms or legs. ? Feel dizzy. ? Lose your balance or coordination. ? Have a very bad headache and you do not know why.  You pass out (lose consciousness) or almost pass out.  You have jerky movements that you cannot control (seizure). These symptoms may be an emergency. Do not wait to see if the symptoms will go away. Get medical help right away. Call your local emergency services (911 in the [place] Do not drive yourself to the hospital. This information is not intended to replace advice given to you by your health care provider. Make sure you discuss any questions you have with your health care provider. Document Released: 07/10/2011 Document Revised: 01/01/2016 Document Reviewed: 10/17/2015 Elsevier Interactive Patient Education  2018 [employer]. Stroke Prevention Some medical conditions and  behaviors are associated with an increased chance of having a stroke. You may prevent a stroke by making healthy choices and managing medical conditions. How can I reduce my risk of having a stroke?  Stay physically active. Get at least 30 minutes of activity on most or all days.  Do not smoke. It may also be helpful to avoid exposure to secondhand smoke.  Limit alcohol use. Moderate alcohol use is considered to be: ? No more than 2 drinks per day for men. ? No more than 1 drink per day for nonpregnant women.  Eat healthy foods. This involves: ? Eating 5 or more servings of fruits and vegetables a day. ? Making dietary changes that address high blood pressure (hypertension), high cholesterol, diabetes, or obesity.  Manage your cholesterol levels. ? Making food choices that are high in fiber and low in saturated fat, trans fat, and cholesterol may control cholesterol levels. ?  Take any prescribed medicines to control cholesterol as directed by your health care provider.  Manage your diabetes. ? Controlling your carbohydrate and sugar intake is recommended to manage diabetes. ? Take any prescribed medicines to control diabetes as directed by your health care provider.  Control your hypertension. ? Making food choices that are low in salt (sodium), saturated fat, trans fat, and cholesterol is recommended to manage hypertension. ? Ask your health care provider if you need treatment to lower your blood pressure. Take any prescribed medicines to control hypertension as directed by your health care provider. ? If you are 34-34 years of age, have your blood pressure checked every 3-5 years. If you are 57 years of age or older, have your blood pressure checked every year.  Maintain a healthy weight. ? Reducing calorie intake and making food choices that are low in sodium, saturated fat, trans fat, and cholesterol are recommended to manage weight.  Stop drug abuse.  Avoid taking birth control  pills. ? Talk to your health care provider about the risks of taking birth control pills if you are over 89 years old, smoke, get migraines, or have ever had a blood clot.  Get evaluated for sleep disorders (sleep apnea). ? Talk to your health care provider about getting a sleep evaluation if you snore a lot or have excessive sleepiness.  Take medicines only as directed by your health care provider. ? For some people, aspirin or blood thinners (anticoagulants) are helpful in reducing the risk of forming abnormal blood clots that can lead to stroke. If you have the irregular heart rhythm of atrial fibrillation, you should be on a blood thinner unless there is a good reason you cannot take them. ? Understand all your medicine instructions.  Make sure that other conditions (such as anemia or atherosclerosis) are addressed. Get help right away if:  You have sudden weakness or numbness of the face, arm, or leg, especially on one side of the body.  Your face or eyelid droops to one side.  You have sudden confusion.  You have trouble speaking (aphasia) or understanding.  You have sudden trouble seeing in one or both eyes.  You have sudden trouble walking.  You have dizziness.  You have a loss of balance or coordination.  You have a sudden, severe headache with no known cause.  You have new chest pain or an irregular heartbeat. Any of these symptoms may represent a serious problem that is an emergency. Do not wait to see if the symptoms will go away. Get medical help at once. Call your local emergency services (911 in [place] Do not drive yourself to the hospital. This information is not intended to replace advice given to you by your health care provider. Make sure you discuss any questions you have with your health care provider. Document Released: 08/28/2004 Document Revised: 12/27/2015 Document Reviewed: 01/21/2013 Elsevier Interactive Patient Education  2017 [employer]   Transient Ischemic Attack A transient ischemic attack (TIA) is a "warning stroke" that causes stroke-like symptoms. A TIA does not cause lasting damage to the brain. The symptoms of a TIA can happen fast and do not last long. It is important to know the symptoms of a TIA and what to do. This can help prevent stroke or death. Follow these instructions at home:  Take medicines only as told by your doctor. Make sure you understand all of the instructions.  You may need to take aspirin or warfarin medicine. Warfarin needs to  be taken exactly as told. ? Taking too much or too little warfarin is dangerous. Blood tests must be done as often as told by your doctor. A PT blood test measures how long it takes for blood to clot. Your PT is used to calculate another value called an INR. Your PT and INR help your doctor adjust your warfarin dosage. He or she will make sure you are taking the right amount. ? Food can cause problems with warfarin and affect the results of your blood tests. This is true for foods high in vitamin K. Eat the same amount of foods high in vitamin K each day. Foods high in vitamin K include spinach, kale, broccoli, cabbage, collard and turnip greens, Brussels sprouts, peas, cauliflower, seaweed, and parsley. Other foods high in vitamin K include beef and pork liver, green tea, and soybean oil. Eat the same amount of foods high in vitamin K each day. Avoid big changes in your diet. Tell your doctor before changing your diet. Talk to a food specialist (dietitian) if you have questions. ? Many medicines can cause problems with warfarin and affect your PT and INR. Tell your doctor about all medicines you take. This includes vitamins and dietary pills (supplements). Do not take or stop taking any prescribed or over-the-counter medicines unless your doctor tells you to. ? Warfarin can cause more bruising or bleeding. Hold pressure over any cuts for longer than normal. Talk to your doctor  about other side effects of warfarin. ? Avoid sports or activities that may cause injury or bleeding. ? Be careful when you shave, floss, or use sharp objects. ? Avoid or drink very little alcohol while taking warfarin. Tell your doctor if you change how much alcohol you drink. ? Tell your dentist and other doctors that you take warfarin before any procedures.  Follow your diet program as told, if you are given one.  Keep a healthy weight.  Stay active. Try to get at least 30 minutes of activity on all or most days.  Do not use any tobacco products, including cigarettes, chewing tobacco, or electronic cigarettes. If you need help quitting, ask your doctor.  Limit alcohol intake to no more than 1 drink per day for nonpregnant women and 2 drinks per day for men. One drink equals 12 ounces of beer, 5 ounces of wine, or 1 ounces of hard liquor.  Do not abuse drugs.  Keep your home safe so you do not fall. You can do this by: ? Putting grab bars in the bedroom and bathroom. ? Raising toilet seats. ? Putting a seat in the shower.  Keep all follow-up visits as told by your doctor. This is important. Contact a doctor if:  Your personality changes.  You have trouble swallowing.  You have double vision.  You are dizzy.  You have a fever. Get help right away if: These symptoms may be an emergency. Do not wait to see if the symptoms will go away. Get medical help right away. Call your local emergency services (911 in the [place] Do not drive yourself to the hospital.  You have sudden weakness or lose feeling (go numb), especially on one side of the body. This can affect your: ? Face. ? Arm. ? Leg.  You have sudden trouble walking.  You have sudden trouble moving your arms or legs.  You have sudden confusion.  You have trouble talking.  You have trouble understanding.  You have sudden trouble seeing in one or both eyes.  You lose your balance.  Your movements are not  smooth.  You have a sudden, very bad headache with no known cause.  You have new chest pain.  Your heartbeat is unsteady.  You are partly or totally unaware of what is going on around you.  This information is not intended to replace advice given to you by your health care provider. Make sure you discuss any questions you have with your health care provider. Document Released: 04/29/2008 Document Revised: 03/24/2016 Document Reviewed: 10/26/2013 Elsevier Interactive Patient Education  [employer].

## 2017-07-20 NOTE — Progress Notes (Signed)
Location:  [facility] clinic Provider:  Gemma Ruan L. Mariea Clonts, D.O., C.M.D.  Code Status: full code Goals of Care:  Advanced Directives 07/02/2017  Does Patient Have a Medical Advance Directive? No  Would patient like information on creating a medical advance directive? No - Patient declined   Chief Complaint  Patient presents with  . Medical Management of Chronic Issues    41mth follow-up, discuss emergency room visit    HPI: Patient is a 71 y.o. female seen today for medical management of chronic diseases and f/u after ED visit.  She had presented 07/03/17 with difficulty speaking and right arm weakness that had begun at 12pm on 11/29 and lasted 5-10 minutes, then resolved.  She also had a headache.   She was shaky and had trouble walking.  They went home and then went to the ED at 9pm and stayed till 5:30am.   A workup for stroke was recommended, but pt refused to stay and was to f/u with me in one week and take aspirin 325mg  daily.  She is here over 2 weeks later.  She was diagnosed with a TIA in the ED.  CT had shown mild atrophy and chronic small vessel ischemia, but no acute abnormality.  She has not had MRI/MRA, carotid dopplers or echo.    Hyperlipidemia:  At goal with LDL right at 70.    DMII:  Control has worsened with hba1c now 7.6 up from 7.5. On tradjenta 5mg  daily, jardiance 25mg  po daily and is on full dose asa now and ace and pravachol 80mg .    Refuses flu shot.    Getting lightheaded on standing at times--comes and goes.  Not orthostatic today with readings in 120s/80s and pulse 54 to 61 with positional change.  Past Medical History:  Diagnosis Date  . Disorder of bone and cartilage, unspecified   . Dizziness and giddiness   . Hypercalcemia   . Hyperlipidemia   . Hypertension   . Hypopotassemia   . Other malaise and fatigue   . Other specified disease of white blood cells   . Special screening for malignant neoplasms, colon   . Type II or unspecified type diabetes mellitus  without mention of complication, not stated as uncontrolled   . Unspecified vitamin D deficiency     Past Surgical History:  Procedure Laterality Date  . COLONOSCOPY  05/14/15   Dr. Collene Mares  . FRACTURE SURGERY  2001   Tibia & fibula  . TONSILLECTOMY  1996    Allergies  Allergen Reactions  . Metformin And Related Diarrhea    Outpatient Encounter Medications as of 07/20/2017  Medication Sig  . aspirin EC 325 MG tablet Take 1 tablet (325 mg total) by mouth daily.  . benazepril-hydrochlorthiazide (LOTENSIN HCT) 20-12.5 MG tablet take 1 tablet by mouth once daily  . Calcium Carbonate-Vitamin D 600-400 MG-UNIT tablet Take 1 tablet by mouth daily.  . empagliflozin (JARDIANCE) 25 MG TABS tablet Take 25 mg by mouth daily.  Marland Kitchen glucose blood (FREESTYLE LITE) test strip Use as instructed to test blood sugar once daily dx E11.9  . Lancets (FREESTYLE) lancets TEST as directed  . omeprazole (PRILOSEC) 40 MG capsule Take 40 mg by mouth daily. Reported on 09/10/2015  . pravastatin (PRAVACHOL) 80 MG tablet Take 1 tablet (80 mg total) by mouth daily.  . TRADJENTA 5 MG TABS tablet take 1 tablet by mouth once daily   No facility-administered encounter medications on file as of 07/20/2017.     Review of  Systems:  Review of Systems  Constitutional: Negative for chills, fever and malaise/fatigue.  HENT: Negative for congestion and hearing loss.   Eyes: Negative for blurred vision.       Glasses  Respiratory: Negative for cough and shortness of breath.   Cardiovascular: Negative for chest pain, palpitations and leg swelling.  Gastrointestinal: Negative for abdominal pain, blood in stool, constipation and melena.  Genitourinary: Negative for dysuria.  Musculoskeletal: Negative for falls and joint pain.  Skin: Negative for itching and rash.  Neurological: Positive for dizziness. Negative for tingling, sensory change, speech change, focal weakness, loss of consciousness, weakness and headaches.    Endo/Heme/Allergies: Does not bruise/bleed easily.  Psychiatric/Behavioral: Negative for depression and memory loss. The patient is not nervous/anxious and does not have insomnia.     Health Maintenance  Topic Date Due  . OPHTHALMOLOGY EXAM  11/02/1957  . FOOT EXAM  01/13/2017  . INFLUENZA VACCINE  03/04/2017  . PNA vac Low Risk Adult (2 of 2 - PPSV23) 04/21/2017  . Hepatitis C Screening  08/04/2017 (Originally Jul 03, 1946)  . MAMMOGRAM  10/21/2017  . HEMOGLOBIN A1C  01/06/2018  . TETANUS/TDAP  12/31/2024  . COLONOSCOPY  05/13/2025  . DEXA SCAN  Completed    Physical Exam: Vitals:   07/20/17 1200  BP: 130/80  Pulse: (!) 57  Temp: 98.1 F (36.7 C)  TempSrc: Oral  SpO2: 99%  Weight: 125 lb (56.7 kg)   Body mass index is 22.86 kg/m. Physical Exam  Constitutional: She is oriented to person, place, and time. She appears well-developed and well-nourished. No distress.  HENT:  Head: Normocephalic and atraumatic.  Mouth/Throat: No oropharyngeal exudate.  Eyes: EOM are normal. Pupils are equal, round, and reactive to light.  Neck: Neck supple.  Cardiovascular: Normal rate, regular rhythm, normal heart sounds and intact distal pulses.  No carotid bruits  Pulmonary/Chest: Effort normal and breath sounds normal. No respiratory distress.  Abdominal: Bowel sounds are normal.  Musculoskeletal: Normal range of motion. She exhibits no tenderness.  Neurological: She is alert and oriented to person, place, and time. She displays normal reflexes. No cranial nerve deficit or sensory deficit. She exhibits normal muscle tone. Coordination normal.  Skin: Skin is warm and dry. Capillary refill takes less than 2 seconds.  Psychiatric: She has a normal mood and affect.    Labs reviewed: Basic Metabolic Panel: Recent Labs    08/18/16 1118 03/16/17 0907 07/03/17 0307 07/08/17 0848  NA  --  142 138 141  K  --  4.4 3.3* 4.2  CL  --  109 103 107  CO2  --  26 26 26   GLUCOSE  --  111* 125*  132*  BUN  --  17 17 24   CREATININE  --  0.74 0.67 0.69  CALCIUM  --  9.2 9.6 9.3  TSH 0.73  --   --   --    Liver Function Tests: Recent Labs    07/03/17 0307  AST 19  ALT 23  ALKPHOS 83  BILITOT 0.5  PROT 7.7  ALBUMIN 4.4   No results for input(s): LIPASE, AMYLASE in the last 8760 hours. No results for input(s): AMMONIA in the last 8760 hours. CBC: Recent Labs    07/03/17 0307  WBC 12.7*  NEUTROABS 5.2  HGB 12.8  HCT 36.4  MCV 86.5  PLT 453*   Lipid Panel: Recent Labs    08/05/16 1028 11/18/16 0831 03/16/17 0907 07/08/17 0848  CHOL 187 162 153 150  HDL  57 57 55 63  LDLCALC 97 46 72  --   TRIG 163* 296* 132 89  CHOLHDL 3.3 2.8 2.8 2.4   Lab Results  Component Value Date   HGBA1C 7.6 (H) 07/08/2017    Procedures since last visit: Ct Head Wo Contrast  Result Date: 07/03/2017 CLINICAL DATA:  Focal neurologic deficit greater than 6 hours. Slurred speech, right arm numbness, onset 12 hours prior. Dizziness and headache. EXAM: CT HEAD WITHOUT CONTRAST TECHNIQUE: Contiguous axial images were obtained from the base of the skull through the vertex without intravenous contrast. COMPARISON:  None. FINDINGS: Brain: Normal for age atrophy. Mild chronic small vessel ischemia. No intracranial hemorrhage, mass effect, or midline shift. No hydrocephalus. The basilar cisterns are patent. No evidence of territorial infarct or acute ischemia. No extra-axial or intracranial fluid collection. Vascular: Atherosclerosis of skullbase vasculature without hyperdense vessel or abnormal calcification. Skull: No fracture or focal lesion. Sinuses/Orbits: Trace left maxillary sinus mucosal thickening. No fluid levels. Mastoid air cells are clear. Other: None. IMPRESSION: Mild atrophy and chronic small vessel ischemia. No CT findings of acute abnormality. Electronically Signed   By: Jeb Levering M.D.   On: 07/03/2017 02:47    Assessment/Plan 1. Transient ischemic attack (TIA) - counseled  extensively today about modifying stroke risk factors with diet and exercise, cont bp, lipid and glucose control - MR Brain Wo Contrast; Future - US Carotid Bilateral; Future - Hemoglobin A1c; Future - Lipid panel; Future  2. Type 2 diabetes mellitus without complication, without long-term current use of insulin ([facility]) - needs improved glucose control, but refuses med changes today, wants to work on diet and exercise first, cont jardiance and tradjenta - MR Brain Wo Contrast; Future - US Carotid Bilateral; Future - CBC with Differential/Platelet; Future - COMPLETE METABOLIC PANEL WITH GFR; Future - Hemoglobin A1c; Future - Lipid panel; Future  3. Mixed hyperlipidemia - cont pravachol high dose - MR Brain Wo Contrast; Future - US Carotid Bilateral; Future - Lipid panel; Future  4. Essential hypertension - bp at goal, cont same regimen, refilled meds, on ace - MR Brain Wo Contrast; Future - US Carotid Bilateral; Future  5. Orthostatic dizziness - not orthostatic today, but I think she probably is at times--counseled on hydration and slow movement with positional change - MR Brain Wo Contrast; Future - US Carotid Bilateral; Future  6. Need for 23-polyvalent pneumococcal polysaccharide vaccine -pneumovax given  Labs/tests ordered:   Orders Placed This Encounter  Procedures  . MR Brain Wo Contrast    Standing Status:   Future    Standing Expiration Date:   09/20/2018    Order Specific Question:   What is the patient's sedation requirement?    Answer:   No Sedation    Order Specific Question:   Does the patient have a pacemaker or implanted devices?    Answer:   No    Order Specific Question:   Preferred imaging location?    Answer:   [facility] (table limit-550lbs)    Order Specific Question:   Radiology Contrast Protocol - do NOT remove file path    Answer:   [email or web address]    Order Specific Question:   Reason for Exam additional  comments    Answer:   transient ischemic attack with right arm weakness, aphasia, headache and unsteady gait  . US Carotid Bilateral    TIA with right arm weakness, aphasia, unsteady gait and headache    Standing Status:   Future  Standing Expiration Date:   09/20/2018    Order Specific Question:   Preferred imaging location?    Answer:   [facility]  . CBC with Differential/Platelet    Standing Status:   Future    Standing Expiration Date:   03/20/2018  . COMPLETE METABOLIC PANEL WITH GFR    Standing Status:   Future    Standing Expiration Date:   03/20/2018  . Hemoglobin A1c    Standing Status:   Future    Standing Expiration Date:   03/20/2018  . Lipid panel    Standing Status:   Future    Standing Expiration Date:   03/20/2018   Next appt:  4 mos labs before  Twinkle Sockwell L. Cincere Zorn, D.O. [facility] [address], [place] [address] Cell Phone (Mon-Fri 8am-5pm):  [phone] On Call:  [phone] & follow prompts after 5pm & weekends Office Phone:  [phone] Office Fax:  [phone]

## 2017-07-24 ENCOUNTER — Ambulatory Visit
Admission: RE | Admit: 2017-07-24 | Discharge: 2017-07-24 | Disposition: A | Discharge status: Home / Self Care | Payer: Medicare Other | Source: Ambulatory Visit | Attending: Internal Medicine | Admitting: Internal Medicine

## 2017-07-24 DIAGNOSIS — G459 Transient cerebral ischemic attack, unspecified: Secondary | ICD-10-CM

## 2017-07-24 DIAGNOSIS — I639 Cerebral infarction, unspecified: Secondary | ICD-10-CM | POA: Diagnosis not present

## 2017-07-24 DIAGNOSIS — R42 Dizziness and giddiness: Secondary | ICD-10-CM

## 2017-07-24 DIAGNOSIS — I10 Essential (primary) hypertension: Secondary | ICD-10-CM

## 2017-07-24 DIAGNOSIS — E119 Type 2 diabetes mellitus without complications: Secondary | ICD-10-CM

## 2017-07-24 DIAGNOSIS — E782 Mixed hyperlipidemia: Secondary | ICD-10-CM

## 2017-07-29 ENCOUNTER — Encounter: Payer: Self-pay | Admitting: *Deleted

## 2017-07-30 ENCOUNTER — Encounter: Payer: Self-pay | Admitting: Internal Medicine

## 2017-07-30 ENCOUNTER — Other Ambulatory Visit: Payer: Self-pay | Admitting: Internal Medicine

## 2017-07-30 ENCOUNTER — Ambulatory Visit (INDEPENDENT_AMBULATORY_CARE_PROVIDER_SITE_OTHER): Payer: Medicare Other | Admitting: Internal Medicine

## 2017-07-30 VITALS — BP 130/80 | HR 70 | Temp 98.6°F | Wt 125.0 lb

## 2017-07-30 DIAGNOSIS — E1159 Type 2 diabetes mellitus with other circulatory complications: Secondary | ICD-10-CM | POA: Diagnosis not present

## 2017-07-30 DIAGNOSIS — I679 Cerebrovascular disease, unspecified: Secondary | ICD-10-CM | POA: Diagnosis not present

## 2017-07-30 DIAGNOSIS — I6381 Other cerebral infarction due to occlusion or stenosis of small artery: Secondary | ICD-10-CM | POA: Insufficient documentation

## 2017-07-30 NOTE — Patient Instructions (Signed)
Please cut down on sweets and starchy foods.   I will set you up to see Tivis Ringer, our PharmD and diabetes educator to help you with your diet.

## 2017-07-30 NOTE — Progress Notes (Signed)
Location:  [facility] clinic Provider:  Denielle Bayard L. Mariea Clonts, D.O., C.M.D.  Code Status: full code Goals of Care:  Advanced Directives 07/02/2017  Does Patient Have a Medical Advance Directive? No  Would patient like information on creating a medical advance directive? No - Patient declined     Chief Complaint  Patient presents with  . Follow-up    discuss MRI of the brain results    HPI: Patient is a 71 y.o. female with diabetes, htn, hyperlipidemia and right shoulder pain seen today for follow-up on MRI of the brain.    Reviewed results with her and her husband.  "MRI shows mild decreased circulation to the brain overall. 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 This part of the brain receives signals from the body and sends signals to the body from the brain. It does not produce specific symptoms when it's affected by a stroke. Strokes in the is area may be silent. This confirms that she has cerebrovascular disease--we must keep her sugar, cholesterol and blood pressure under good control to prevent recurrence. This is a serious matter." as I noted on her MRI report.    Past Medical History:  Diagnosis Date  . Disorder of bone and cartilage, unspecified   . Dizziness and giddiness   . Hypercalcemia   . Hyperlipidemia   . Hypertension   . Hypopotassemia   . Other malaise and fatigue   . Other specified disease of white blood cells   . Special screening for malignant neoplasms, colon   . Type II or unspecified type diabetes mellitus without mention of complication, not stated as uncontrolled   . Unspecified vitamin D deficiency     Past Surgical History:  Procedure Laterality Date  . COLONOSCOPY  05/14/15   Dr. Collene Mares  . FRACTURE SURGERY  2001   Tibia & fibula  . TONSILLECTOMY  1996    Allergies  Allergen Reactions  . Metformin And Related Diarrhea    Outpatient Encounter Medications as of 07/30/2017  Medication Sig  . aspirin EC 325 MG  tablet Take 1 tablet (325 mg total) by mouth daily.  . benazepril-hydrochlorthiazide (LOTENSIN HCT) 20-12.5 MG tablet take 1 tablet by mouth once daily  . Calcium Carbonate-Vitamin D 600-400 MG-UNIT tablet Take 1 tablet by mouth daily.  . empagliflozin (JARDIANCE) 25 MG TABS tablet Take 25 mg by mouth daily.  Marland Kitchen glucose blood (FREESTYLE LITE) test strip Use as instructed to test blood sugar once daily dx E11.9  . Lancets (FREESTYLE) lancets Use as directed to test blood sugar once daily  . omeprazole (PRILOSEC) 40 MG capsule Take 40 mg by mouth daily. Reported on 09/10/2015  . pravastatin (PRAVACHOL) 80 MG tablet Take 1 tablet (80 mg total) by mouth daily.  . TRADJENTA 5 MG TABS tablet take 1 tablet by mouth once daily   No facility-administered encounter medications on file as of 07/30/2017.     Review of Systems:  Review of Systems  Constitutional: Negative for chills, fever and malaise/fatigue.  HENT: Negative for congestion and hearing loss.   Eyes:       Glasses  Respiratory: Negative for shortness of breath.   Cardiovascular: Negative for chest pain, palpitations and leg swelling.  Gastrointestinal: Negative for abdominal pain, blood in stool, constipation and melena.  Genitourinary: Negative for dysuria.  Musculoskeletal: Negative for falls and joint pain.  Skin: Negative for itching and rash.  Neurological: Negative for dizziness, loss of consciousness and  weakness.  Endo/Heme/Allergies: Does not bruise/bleed easily.  Psychiatric/Behavioral: Negative for depression and memory loss.    Health Maintenance  Topic Date Due  . OPHTHALMOLOGY EXAM  11/02/1957  . FOOT EXAM  01/13/2017  . INFLUENZA VACCINE  03/04/2017  . Hepatitis C Screening  08/04/2017 (Originally 06/28/1946)  . MAMMOGRAM  10/21/2017  . HEMOGLOBIN A1C  01/06/2018  . TETANUS/TDAP  12/31/2024  . COLONOSCOPY  05/13/2025  . DEXA SCAN  Completed  . PNA vac Low Risk Adult  Completed    Physical Exam: Vitals:    07/30/17 0903  BP: 130/80  Pulse: 70  Temp: 98.6 F (37 C)  TempSrc: Oral  SpO2: 97%  Weight: 125 lb (56.7 kg)   Body mass index is 22.86 kg/m. Physical Exam  Constitutional: She is oriented to person, place, and time. She appears well-developed and well-nourished. No distress.  Cardiovascular: Normal rate, regular rhythm, normal heart sounds and intact distal pulses.  No murmur heard. Pulmonary/Chest: Effort normal and breath sounds normal. No respiratory distress.  Abdominal: Bowel sounds are normal.  Musculoskeletal: Normal range of motion.  Right shoulder has improved  Neurological: She is alert and oriented to person, place, and time.  Skin: Skin is warm and dry. Capillary refill takes less than 2 seconds.  Psychiatric: She has a normal mood and affect.    Labs reviewed: Basic Metabolic Panel: Recent Labs    08/18/16 1118 03/16/17 0907 07/03/17 0307 07/08/17 0848  NA  --  142 138 141  K  --  4.4 3.3* 4.2  CL  --  109 103 107  CO2  --  26 26 26   GLUCOSE  --  111* 125* 132*  BUN  --  17 17 24   CREATININE  --  0.74 0.67 0.69  CALCIUM  --  9.2 9.6 9.3  TSH 0.73  --   --   --    Liver Function Tests: Recent Labs    07/03/17 0307  AST 19  ALT 23  ALKPHOS 83  BILITOT 0.5  PROT 7.7  ALBUMIN 4.4   No results for input(s): LIPASE, AMYLASE in the last 8760 hours. No results for input(s): AMMONIA in the last 8760 hours. CBC: Recent Labs    07/03/17 0307  WBC 12.7*  NEUTROABS 5.2  HGB 12.8  HCT 36.4  MCV 86.5  PLT 453*   Lipid Panel: Recent Labs    08/05/16 1028 11/18/16 0831 03/16/17 0907 07/08/17 0848  CHOL 187 162 153 150  HDL 57 57 55 63  LDLCALC 97 46 72  --   TRIG 163* 296* 132 89  CHOLHDL 3.3 2.8 2.8 2.4   Lab Results  Component Value Date   HGBA1C 7.6 (H) 07/08/2017    Procedures since last visit: Ct Head Wo Contrast  Result Date: 07/03/2017 CLINICAL DATA:  Focal neurologic deficit greater than 6 hours. Slurred speech, right arm  numbness, onset 12 hours prior. Dizziness and headache. EXAM: CT HEAD WITHOUT CONTRAST TECHNIQUE: Contiguous axial images were obtained from the base of the skull through the vertex without intravenous contrast. COMPARISON:  None. FINDINGS: Brain: Normal for age atrophy. Mild chronic small vessel ischemia. No intracranial hemorrhage, mass effect, or midline shift. No hydrocephalus. The basilar cisterns are patent. No evidence of territorial infarct or acute ischemia. No extra-axial or intracranial fluid collection. Vascular: Atherosclerosis of skullbase vasculature without hyperdense vessel or abnormal calcification. Skull: No fracture or focal lesion. Sinuses/Orbits: Trace left maxillary sinus mucosal thickening. No fluid levels. Mastoid air  cells are clear. Other: None. IMPRESSION: Mild atrophy and chronic small vessel ischemia. No CT findings of acute abnormality. Electronically Signed   By: Jeb Levering M.D.   On: 07/03/2017 02:47   Mr Brain Wo Contrast  Result Date: 07/24/2017 CLINICAL DATA:  Stroke-like symptoms July 03, 2017. Aphasia and right arm weakness. EXAM: MRI HEAD WITHOUT CONTRAST TECHNIQUE: Multiplanar, multiecho pulse sequences of the brain and surrounding structures were obtained without intravenous contrast. COMPARISON:  Head CT 07/03/2017 FINDINGS: Brain: No acute or subacute infarction, hemorrhage, hydrocephalus, extra-axial collection or mass lesion. There is mild patchy FLAIR hyperintensities in the deep cerebral white matter and pons, nonspecific but likely from chronic small vessel ischemia given patient's vascular risk factors. Chronic lacune in the left corona radiata. Normal brain volume. Vascular: Major flow voids are preserved. Skull and upper cervical spine: Negative for marrow lesion Sinuses/Orbits: Negative IMPRESSION: 1. No acute or subacute infarct. 2. Chronic small vessel ischemic injury that is overall mild. 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  Electronically Signed   By: Monte Fantasia M.D.   On: 07/24/2017 13:10    Assessment/Plan 1. Cerebrovascular disease -cont to work on diet, exercise, bp, glucose and cholesterol control -glucose could be better -pt insists upon working on diet more before med changes -will refer to Tivis Ringer for appt to address her dietary changes  2. Lacunar infarction -educated on this today, see #1 -no residual effects  3. Type 2 diabetes mellitus with other circulatory complication, without long-term current use of insulin (HCC) -cont jardiance therapy, work on more smaller meals, fewer sweets and starches and more healthy veggies  Labs/tests ordered:  No orders of the defined types were placed in this encounter.  Next appt:  11/03/2017 with labs before and visit with Cathey on an upcoming Monday   Kennth Vanbenschoten L. Anjanette Gilkey, D.O. [facility] [address], [place] [address] Cell Phone (Mon-Fri 8am-5pm):  [phone] On Call:  [phone] & follow prompts after 5pm & weekends Office Phone:  [phone] Office Fax:  [phone]

## 2017-08-03 ENCOUNTER — Encounter: Payer: Self-pay | Admitting: Pharmacotherapy

## 2017-08-03 ENCOUNTER — Ambulatory Visit (INDEPENDENT_AMBULATORY_CARE_PROVIDER_SITE_OTHER): Payer: Medicare Other | Admitting: Pharmacotherapy

## 2017-08-03 VITALS — BP 116/72 | HR 67 | Ht 62.0 in | Wt 127.0 lb

## 2017-08-03 DIAGNOSIS — I6381 Other cerebral infarction due to occlusion or stenosis of small artery: Secondary | ICD-10-CM | POA: Diagnosis not present

## 2017-08-03 DIAGNOSIS — E1159 Type 2 diabetes mellitus with other circulatory complications: Secondary | ICD-10-CM

## 2017-08-03 DIAGNOSIS — I10 Essential (primary) hypertension: Secondary | ICD-10-CM | POA: Diagnosis not present

## 2017-08-03 NOTE — Patient Instructions (Signed)
Limit starchy food to 1 serving per meal.

## 2017-08-03 NOTE — Progress Notes (Signed)
  Subjective:    Joann Kennedy is a 71 y.o.[place] female who presents for follow-up of Type 2 diabetes mellitus.  A1C starting to climb again - 7.8% - 7.5%- 7.6% Currently on Jardiance, metformin, and Tradjenta. Diarrhea with higher doses of metformin. EGFR: 60 ml/min PMH is significant for cerebrovascular disease with a recent TIA this month.  Did not bring blood glucose meter. 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 No dietary restriction, but does not like fruit.  Diet is heavy in rice. Denies skipping meals.  Denies problems with vision.  Wears glasses. No peripheral edema Denies problems with feet. Nocturia is rare. No dysuria Staying well hydrated with water. Review of Systems A comprehensive review of systems was negative except for: Eyes: positive for contacts/glasses    Objective:    BP 116/72   Pulse 67   Ht '5\' 2"'$  (1.575 m)   Wt 127 lb (57.6 kg)   SpO2 98%   BMI 23.23 kg/m   General:  alert, cooperative and no distress  Oropharynx: normal findings: lips normal without lesions and gums healthy   Eyes:  negative findings: lids and lashes normal and conjunctivae and sclerae normal   Ears:  external ears normal        Lung: clear to auscultation bilaterally  Heart:  regular rate and rhythm     Extremities: extremities normal, atraumatic, no cyanosis or edema  Skin: warm and dry, no hyperpigmentation, vitiligo, or suspicious lesions     Neuro: mental status, speech normal, alert and oriented x3 and gait and station normal   Lab Review Glucose, Bld (mg/dL)  Date Value  07/08/2017 132 (H)  07/03/2017 125 (H)  03/16/2017 111 (H)   CO2 (mmol/L)  Date Value  07/08/2017 26  07/03/2017 26  03/16/2017 26   BUN (mg/dL)  Date Value  07/08/2017 24  07/03/2017 17  03/16/2017 17  01/07/2016 17  08/08/2015 14  11/20/2014 18   Creat (mg/dL)  Date Value  07/08/2017 0.69  03/16/2017  0.74  04/17/2016 0.76   Creatinine, Ser (mg/dL)  Date Value  07/03/2017 0.67       Assessment:    Diabetes Mellitus type II, under fair control. A1C above goal <7% BP at goal <130/80   Plan:    1.  Rx changes: none  2.  Continue Jardiance, Metformin, and Tradjenta. 3.  Explained insulin resistance. 4.  Counseled on how her current RX is helping her and why she needs more than 1 medication. 5.  Counseled on meal planning and portion control.  Her specific issue is rice.  Provided written information to supplement education. 6.  Counseled on importance of routine exercise.  Goal is 30-45 minutes 5 x week. 7.  BP at goal <130/80.

## 2017-08-24 ENCOUNTER — Ambulatory Visit: Payer: Self-pay | Admitting: Internal Medicine

## 2017-09-14 NOTE — Telephone Encounter (Signed)
ERROR

## 2017-10-28 ENCOUNTER — Telehealth: Payer: Self-pay | Admitting: *Deleted

## 2017-10-28 DIAGNOSIS — I10 Essential (primary) hypertension: Secondary | ICD-10-CM

## 2017-10-28 MED ORDER — LISINOPRIL-HYDROCHLOROTHIAZIDE 20-12.5 MG PO TABS
1.0000 | ORAL_TABLET | Freq: Every day | ORAL | 3 refills | Status: DC
Start: 2017-10-28 — End: 2018-10-25

## 2017-10-28 NOTE — Telephone Encounter (Signed)
Received fax from [facility] stating Benazepril/HCTZ 20/12.5mg  is currently on backorder and the patient is seeking a temporary replacement. Please Advise.

## 2017-10-28 NOTE — Telephone Encounter (Signed)
Changed to lisinopril/hctz 20/12.5mg  po daily and sent to [facility].

## 2017-11-03 ENCOUNTER — Other Ambulatory Visit: Payer: Medicare Other

## 2017-11-03 DIAGNOSIS — E782 Mixed hyperlipidemia: Secondary | ICD-10-CM

## 2017-11-03 DIAGNOSIS — G459 Transient cerebral ischemic attack, unspecified: Secondary | ICD-10-CM

## 2017-11-03 DIAGNOSIS — E119 Type 2 diabetes mellitus without complications: Secondary | ICD-10-CM

## 2017-11-04 LAB — COMPLETE METABOLIC PANEL WITH GFR
AG Ratio: 1.3 (calc) (ref 1.0–2.5)
ALT: 15 U/L (ref 6–29)
AST: 15 U/L (ref 10–35)
Albumin: 4.3 g/dL (ref 3.6–5.1)
Alkaline phosphatase (APISO): 82 U/L (ref 33–130)
BUN: 24 mg/dL (ref 7–25)
CO2: 27 mmol/L (ref 20–32)
Calcium: 9.5 mg/dL (ref 8.6–10.4)
Chloride: 104 mmol/L (ref 98–110)
Creat: 0.71 mg/dL (ref 0.60–0.93)
GFR, Est African American: 100 mL/min/{1.73_m2} (ref >=60)
GFR, Est Non African American: 86 mL/min/{1.73_m2} (ref >=60)
Globulin: 3.2 g/dL (calc) (ref 1.9–3.7)
Glucose, Bld: 122 mg/dL — ABNORMAL HIGH (ref 65–99)
Potassium: 4.3 mmol/L (ref 3.5–5.3)
Sodium: 143 mmol/L (ref 135–146)
Total Bilirubin: 0.4 mg/dL (ref 0.2–1.2)
Total Protein: 7.5 g/dL (ref 6.1–8.1)

## 2017-11-04 LAB — CBC WITH DIFFERENTIAL/PLATELET
Basophils Absolute: 55 cells/uL (ref 0–200)
Basophils Relative: 0.6 %
Eosinophils Absolute: 218 cells/uL (ref 15–500)
Eosinophils Relative: 2.4 %
HCT: 36.3 % (ref 35.0–45.0)
Hemoglobin: 12.2 g/dL (ref 11.7–15.5)
Lymphs Abs: 4186 cells/uL — ABNORMAL HIGH (ref 850–3900)
MCH: 29.3 pg (ref 27.0–33.0)
MCHC: 33.6 g/dL (ref 32.0–36.0)
MCV: 87.3 fL (ref 80.0–100.0)
MPV: 8.8 fL (ref 7.5–12.5)
Monocytes Relative: 4.9 %
Neutro Abs: 4195 cells/uL (ref 1500–7800)
Neutrophils Relative %: 46.1 %
Platelets: 475 10*3/uL — ABNORMAL HIGH (ref 140–400)
RBC: 4.16 10*6/uL (ref 3.80–5.10)
RDW: 12.9 % (ref 11.0–15.0)
Total Lymphocyte: 46 %
WBC mixed population: 446 cells/uL (ref 200–950)
WBC: 9.1 10*3/uL (ref 3.8–10.8)

## 2017-11-04 LAB — LIPID PANEL
Cholesterol: 179 mg/dL (ref <200)
HDL: 57 mg/dL (ref >50)
LDL Cholesterol (Calc): 95 mg/dL (calc)
Non-HDL Cholesterol (Calc): 122 mg/dL (calc) (ref <130)
Total CHOL/HDL Ratio: 3.1 (calc) (ref <5.0)
Triglycerides: 160 mg/dL — ABNORMAL HIGH (ref <150)

## 2017-11-04 LAB — HEMOGLOBIN A1C
Hgb A1c MFr Bld: 7.6 % of total Hgb — ABNORMAL HIGH (ref <5.7)
Mean Plasma Glucose: 171 (calc)
eAG (mmol/L): 9.5 (calc)

## 2017-11-09 ENCOUNTER — Ambulatory Visit (INDEPENDENT_AMBULATORY_CARE_PROVIDER_SITE_OTHER): Payer: Medicare Other | Admitting: Internal Medicine

## 2017-11-09 ENCOUNTER — Encounter: Payer: Self-pay | Admitting: Internal Medicine

## 2017-11-09 VITALS — BP 138/80 | HR 83 | Temp 98.1°F | Ht 62.0 in | Wt 125.0 lb

## 2017-11-09 DIAGNOSIS — I679 Cerebrovascular disease, unspecified: Secondary | ICD-10-CM

## 2017-11-09 DIAGNOSIS — S46001D Unspecified injury of muscle(s) and tendon(s) of the rotator cuff of right shoulder, subsequent encounter: Secondary | ICD-10-CM

## 2017-11-09 DIAGNOSIS — E782 Mixed hyperlipidemia: Secondary | ICD-10-CM

## 2017-11-09 DIAGNOSIS — E1159 Type 2 diabetes mellitus with other circulatory complications: Secondary | ICD-10-CM

## 2017-11-09 DIAGNOSIS — I10 Essential (primary) hypertension: Secondary | ICD-10-CM

## 2017-11-09 MED ORDER — FREESTYLE LANCETS MISC: 1 refills | Status: DC

## 2017-11-09 MED ORDER — GLUCOSE BLOOD VI STRP: ORAL_STRIP | 1 refills | Status: DC

## 2017-11-09 NOTE — Patient Instructions (Addendum)
Labs overall look good. Continue to work on diet (refraining from rice or other food items that are white) replace with brown rice if you can. You can also reduce the amount of rice you eat daily (such as skipping it for one meal). Also watch breads, crackers, fried foods, or sweet foods (cookies, cake, pies, candy). Eat a well balanced diet with fruits and veggies, lower in red meats, but high in lean proteins. Drink plenty of water as well.  Look to walking daily for an average of 30 minutes to start. This can be in two sets of 15 minutes if you desire. Work to get your heart rate up. Aim for a total of 150 minutes a week.   Both of these can help reduce your triglycerides and control your blood sugar better.

## 2017-11-09 NOTE — Progress Notes (Signed)
Location:  [facility] clinic Provider:  Tiffany L. Mariea Clonts, D.O., C.M.D.  Code Status:  FC  Goals of Care:  Advanced Directives 07/02/2017  Does Patient Have a Medical Advance Directive? No  Would patient like information on creating a medical advance directive? No - Patient declined     Chief Complaint  Patient presents with  . Medical Management of Chronic Issues    35mth follow-up, discuss labs    HPI: Patient is a 72 y.o. female seen today for medical management of chronic diseases.  Overall doing well today. She is accompanied by her husband. She is here to review her labs. She reports taking her medications as directed except the blood pressure medication was split into two pills due to back order. This confused them. She was only taking one of them. She has no trouble eat, swallowing, sleeping, or mood changes. Continues to have trouble with diet and eating the right foods. She is also not exercising regularly.   Past Medical History:  Diagnosis Date  . Disorder of bone and cartilage, unspecified   . Dizziness and giddiness   . Hypercalcemia   . Hyperlipidemia   . Hypertension   . Hypopotassemia   . Other malaise and fatigue   . Other specified disease of white blood cells   . Special screening for malignant neoplasms, colon   . Type II or unspecified type diabetes mellitus without mention of complication, not stated as uncontrolled   . Unspecified vitamin D deficiency     Past Surgical History:  Procedure Laterality Date  . COLONOSCOPY  05/14/15   Dr. Collene Mares  . FRACTURE SURGERY  2001   Tibia & fibula  . TONSILLECTOMY  1996    Allergies  Allergen Reactions  . Metformin And Related Diarrhea    Outpatient Encounter Medications as of 11/09/2017  Medication Sig  . aspirin EC 325 MG tablet Take 1 tablet (325 mg total) by mouth daily.  . Calcium Carbonate-Vitamin D 600-400 MG-UNIT tablet Take 1 tablet by mouth daily.  . empagliflozin (JARDIANCE) 25 MG TABS tablet Take 25 mg  by mouth daily.  Marland Kitchen glucose blood (FREESTYLE LITE) test strip Use as instructed to test blood sugar once daily dx E11.9  . Lancets (FREESTYLE) lancets Use as directed to test blood sugar once daily  . lisinopril-hydrochlorothiazide (ZESTORETIC) 20-12.5 MG tablet Take 1 tablet by mouth daily.  Marland Kitchen omeprazole (PRILOSEC) 40 MG capsule Take 40 mg by mouth daily. Reported on 09/10/2015  . pravastatin (PRAVACHOL) 80 MG tablet Take 1 tablet (80 mg total) by mouth daily.  . TRADJENTA 5 MG TABS tablet take 1 tablet by mouth once daily  . [DISCONTINUED] glucose blood (FREESTYLE LITE) test strip Use as instructed to test blood sugar once daily dx E11.9  . [DISCONTINUED] Lancets (FREESTYLE) lancets Use as directed to test blood sugar once daily   No facility-administered encounter medications on file as of 11/09/2017.     Review of Systems:  Review of Systems  Constitutional: Negative for chills, fever and malaise/fatigue.  HENT: Negative for hearing loss.   Eyes: Negative for blurred vision.       Glasses DM eye exam last year  Respiratory: Positive for cough. Negative for shortness of breath.        Cough in the morning-allergy  Cardiovascular: Negative for chest pain, palpitations and leg swelling.  Gastrointestinal: Negative for constipation, diarrhea and heartburn.       Poor dentition in back teeth  Genitourinary: Negative.   Musculoskeletal:  Positive for back pain, joint pain and neck pain.  Neurological: Positive for headaches. Negative for dizziness.  Endo/Heme/Allergies: Positive for environmental allergies.  Psychiatric/Behavioral: Negative for depression and memory loss. The patient is not nervous/anxious and does not have insomnia.     Health Maintenance  Topic Date Due  . Hepatitis C Screening  Aug 25, 1945  . OPHTHALMOLOGY EXAM  11/02/1957  . FOOT EXAM  01/13/2017  . MAMMOGRAM  10/21/2017  . INFLUENZA VACCINE  03/04/2018  . HEMOGLOBIN A1C  05/05/2018  . TETANUS/TDAP  12/31/2024  .  COLONOSCOPY  05/13/2025  . DEXA SCAN  Completed  . PNA vac Low Risk Adult  Completed    Physical Exam: Vitals:   11/09/17 1308  BP: 138/80  Pulse: 83  Temp: 98.1 F (36.7 C)  TempSrc: Oral  SpO2: 97%  Weight: 125 lb (56.7 kg)   Body mass index is 22.86 kg/m. Physical Exam  Constitutional: She is oriented to person, place, and time. She appears well-developed and well-nourished.  HENT:  Head: Normocephalic.  Right Ear: External ear normal.  Left Ear: External ear normal.  Mouth/Throat: Oropharynx is clear and moist.  Eyes: Conjunctivae are normal.  Cardiovascular: Normal rate, regular rhythm, normal heart sounds and intact distal pulses.  Pulmonary/Chest: Effort normal and breath sounds normal.  Abdominal: Soft. Bowel sounds are normal.  Musculoskeletal: Normal range of motion.  Neurological: She is alert and oriented to person, place, and time.  Skin: Skin is warm and dry. Capillary refill takes less than 2 seconds.  Psychiatric: She has a normal mood and affect. Her behavior is normal. Judgment and thought content normal.  Vitals reviewed.   Labs reviewed: Basic Metabolic Panel: Recent Labs    07/03/17 0307 07/08/17 0848 11/03/17 0904  NA 138 141 143  K 3.3* 4.2 4.3  CL 103 107 104  CO2 26 26 27   GLUCOSE 125* 132* 122*  BUN 17 24 24   CREATININE 0.67 0.69 0.71  CALCIUM 9.6 9.3 9.5   Liver Function Tests: Recent Labs    07/03/17 0307 11/03/17 0904  AST 19 15  ALT 23 15  ALKPHOS 83  --   BILITOT 0.5 0.4  PROT 7.7 7.5  ALBUMIN 4.4  --    No results for input(s): LIPASE, AMYLASE in the last 8760 hours. No results for input(s): AMMONIA in the last 8760 hours. CBC: Recent Labs    07/03/17 0307 11/03/17 0904  WBC 12.7* 9.1  NEUTROABS 5.2 4,195  HGB 12.8 12.2  HCT 36.4 36.3  MCV 86.5 87.3  PLT 453* 475*   Lipid Panel: Recent Labs    03/16/17 0907 07/08/17 0848 11/03/17 0904  CHOL 153 150 179  HDL 55 63 57  LDLCALC 72 70 95  TRIG 132 89  160*  CHOLHDL 2.8 2.4 3.1   Lab Results  Component Value Date   HGBA1C 7.6 (H) 11/03/2017    Procedures since last visit: No results found.  Assessment/Plan  1. Essential hypertension BP is stable today, but a little higher than desired with her hx of TIA. There was confusion about her medication as her normal one (combo) Zestoretic was on back order. She was given the combo in two pills and she has only been taking one. Educated today about taking both of the medications until combo pill is back.   2. Type 2 diabetes mellitus with other circulatory complication, without long-term current use of insulin (HCC) Her recent A1c is 7.6, which has been constant even with diet education.  Reeducated in detail today about ways to improve diet with reduction of rice (white) and walking to increase exercise. She reports taking her medication well and without issue. Will reassess at future labs.   3. Mixed hyperlipidemia Her triglycerides are 160, this is above goal. Encourage a diet change as well as exercise to help with this. Will continue statin as this time, as other numbers look good in lipid panel. Will reassess at future date.  4. Injury of right rotator cuff, subsequent encounter Still sore but doing better. She is using a muscle rub from back home.  She reports this is helping her. Encourage to use arm as she can to avoid further injury as she does not want to have surgery.   Labs/tests ordered:   Orders Placed This Encounter  Procedures  . Hemoglobin A1c    Standing Status:   Future    Standing Expiration Date:   07/11/2018  . Basic metabolic panel    Standing Status:   Future    Standing Expiration Date:   07/11/2018    Order Specific Question:   Has the patient fasted?    Answer:   Yes  . Lipid panel    Standing Status:   Future    Standing Expiration Date:   07/11/2018    Order Specific Question:   Has the patient fasted?    Answer:   Yes      Next appt:  03/01/2018   Karen Kays RN, DNP Student  Geriatrics [facility] [phone], [place] [address] Cell Phone (Mon-Fri 8am-5pm):  [phone] On Call:  [phone] & follow prompts after 5pm & weekends Office Phone:  [phone] Office Fax:  [phone]

## 2017-11-10 ENCOUNTER — Encounter: Payer: Self-pay | Admitting: Internal Medicine

## 2017-11-13 ENCOUNTER — Telehealth: Payer: Self-pay | Admitting: Internal Medicine

## 2017-11-13 NOTE — Telephone Encounter (Signed)
Left msg asking pt to confirm AWV-S appt w/ nurse on 11/30/17. Last AWV 11/18/16. VDM (DD)

## 2017-11-30 ENCOUNTER — Ambulatory Visit (INDEPENDENT_AMBULATORY_CARE_PROVIDER_SITE_OTHER): Payer: Medicare Other

## 2017-11-30 VITALS — BP 132/70 | HR 69 | Temp 98.5°F | Ht 62.0 in | Wt 127.0 lb

## 2017-11-30 DIAGNOSIS — Z1231 Encounter for screening mammogram for malignant neoplasm of breast: Secondary | ICD-10-CM

## 2017-11-30 DIAGNOSIS — Z0000 Encounter for general adult medical examination without abnormal findings: Secondary | ICD-10-CM | POA: Diagnosis not present

## 2017-11-30 DIAGNOSIS — E2839 Other primary ovarian failure: Secondary | ICD-10-CM | POA: Diagnosis not present

## 2017-11-30 DIAGNOSIS — Z1239 Encounter for other screening for malignant neoplasm of breast: Secondary | ICD-10-CM

## 2017-11-30 NOTE — Progress Notes (Addendum)
Subjective:   Joann Kennedy is a 72 y.o. female who presents for Medicare Annual (Subsequent) preventive examination.  Last AWV-11/18/2016    Objective:     Vitals: BP 132/70 (BP Location: Right Arm, Patient Position: Sitting)   Pulse 69   Temp 98.5 F (36.9 C) (Oral)   Ht 5\' 2"  (1.575 m)   Wt 127 lb (57.6 kg)   SpO2 96%   BMI 23.23 kg/m   Body mass index is 23.23 kg/m.  Advanced Directives 11/30/2017 07/02/2017 03/23/2017 03/04/2017 11/18/2016 08/18/2016 03/17/2016  Does Patient Have a Medical Advance Directive? No No No Yes;No No No No  Would patient like information on creating a medical advance directive? Yes (MAU/Ambulatory/Procedural Areas - Information given) No - Patient declined No - Patient declined - Yes (MAU/Ambulatory/Procedural Areas - Information given) - No - patient declined information    Tobacco Social History   Tobacco Use  Smoking Status Never Smoker  Smokeless Tobacco Never Used     Counseling given: Not Answered   Clinical Intake:  Pre-visit preparation completed: No  Pain : 0-10 Pain Score: 6  Pain Type: Chronic pain Pain Location: Shoulder Pain Orientation: Right Pain Descriptors / Indicators: Aching Pain Onset: More than a month ago Pain Frequency: Intermittent     Nutritional Risks: None Diabetes: Yes CBG done?: No Did pt. bring in CBG monitor from home?: No  How often do you need to have someone help you when you read instructions, pamphlets, or other written materials from your doctor or pharmacy?: 1 - Never What is the last grade level you completed in school?: College  Interpreter Needed?: No  Information entered by :: [name], RN  Past Medical History:  Diagnosis Date  . Disorder of bone and cartilage, unspecified   . Dizziness and giddiness   . Hypercalcemia   . Hyperlipidemia   . Hypertension   . Hypopotassemia   . Other malaise and fatigue   . Other specified disease of white blood cells   . Special  screening for malignant neoplasms, colon   . Type II or unspecified type diabetes mellitus without mention of complication, not stated as uncontrolled   . Unspecified vitamin D deficiency    Past Surgical History:  Procedure Laterality Date  . COLONOSCOPY  05/14/15   Dr. Collene Mares  . FRACTURE SURGERY  2001   Tibia & fibula  . TONSILLECTOMY  1996   Family History  Problem Relation Age of Onset  . Diabetes Mother   . Heart disease Mother   . Heart disease Brother    Social History   Socioeconomic History  . Marital status: Married    Spouse name: Not on file  . Number of children: Not on file  . Years of education: Not on file  . Highest education level: Not on file  Occupational History  . Not on file  Social Needs  . Financial resource strain: Not hard at all  . Food insecurity:    Worry: Never true    Inability: Never true  . Transportation needs:    Medical: No    Non-medical: No  Tobacco Use  . Smoking status: Never Smoker  . Smokeless tobacco: Never Used  Substance and Sexual Activity  . Alcohol use: No  . Drug use: No  . Sexual activity: Not on file  Lifestyle  . Physical activity:    Days per week: 0 days    Minutes per session: 0 min  . Stress: Only a little  Relationships  . Social connections:    Talks on phone: More than three times a week    Gets together: More than three times a week    Attends religious service: Never    Active member of club or organization: No    Attends meetings of clubs or organizations: Never    Relationship status: Married  Other Topics Concern  . Not on file  Social History Narrative  . Not on file    Outpatient Encounter Medications as of 11/30/2017  Medication Sig  . aspirin EC 325 MG tablet Take 1 tablet (325 mg total) by mouth daily.  . Calcium Carbonate-Vitamin D 600-400 MG-UNIT tablet Take 1 tablet by mouth daily.  . empagliflozin (JARDIANCE) 25 MG TABS tablet Take 25 mg by mouth daily.  Marland Kitchen glucose blood (FREESTYLE  LITE) test strip Use as instructed to test blood sugar once daily dx E11.9  . Lancets (FREESTYLE) lancets Use as directed to test blood sugar once daily  . lisinopril-hydrochlorothiazide (ZESTORETIC) 20-12.5 MG tablet Take 1 tablet by mouth daily.  Marland Kitchen omeprazole (PRILOSEC) 40 MG capsule Take 40 mg by mouth daily. Reported on 09/10/2015  . pravastatin (PRAVACHOL) 80 MG tablet Take 1 tablet (80 mg total) by mouth daily.  . TRADJENTA 5 MG TABS tablet take 1 tablet by mouth once daily   No facility-administered encounter medications on file as of 11/30/2017.     Activities of Daily Living In your present state of health, do you have any difficulty performing the following activities: 11/30/2017  Hearing? N  Vision? N  Difficulty concentrating or making decisions? N  Walking or climbing stairs? N  Dressing or bathing? N  Doing errands, shopping? N  Preparing Food and eating ? N  Using the Toilet? N  In the past six months, have you accidently leaked urine? N  Do you have problems with loss of bowel control? N  Managing your Medications? N  Managing your Finances? N  Housekeeping or managing your Housekeeping? N  Some recent data might be hidden    Patient Care Team: Gayland Curry, DO as PCP - General (Geriatric Medicine)    Assessment:   This is a routine wellness examination for [name].  Exercise Activities and Dietary recommendations Current Exercise Habits: The patient does not participate in regular exercise at present, Exercise limited by: None identified  Goals    None      Fall Risk Fall Risk  11/30/2017 07/20/2017 03/23/2017 11/18/2016 11/17/2016  Falls in the past year? No No No No No   Is the patient's home free of loose throw rugs in walkways, pet beds, electrical cords, etc?   yes      Grab bars in the bathroom? no      Handrails on the stairs?   yes      Adequate lighting?   yes   Depression Screen PHQ 2/9 Scores 11/30/2017 07/20/2017 03/23/2017 11/18/2016  PHQ - 2  Score 0 0 0 0     Cognitive Function MMSE - Mini Mental State Exam 11/30/2017 11/18/2016  Orientation to time 5 5  Orientation to Place 4 5  Registration 3 3  Attention/ Calculation 5 5  Recall 1 1  Language- name 2 objects 2 2  Language- repeat 1 1  Language- follow 3 step command 3 3  Language- read & follow direction 1 1  Write a sentence 1 1  Copy design 1 0  Total score 27 27  Immunization History  Administered Date(s) Administered  . Influenza Whole 08/10/2013  . Influenza-Unspecified 05/05/2015  . Pneumococcal Conjugate-13 04/21/2016  . Pneumococcal Polysaccharide-23 07/20/2017  . Tdap 01/01/2015    Qualifies for Shingles Vaccine? Yes, educated and want to wait  Screening Tests Health Maintenance  Topic Date Due  . Hepatitis C Screening  January 17, 1946  . OPHTHALMOLOGY EXAM  11/02/1957  . FOOT EXAM  01/13/2017  . MAMMOGRAM  10/21/2017  . INFLUENZA VACCINE  03/04/2018  . HEMOGLOBIN A1C  05/05/2018  . TETANUS/TDAP  12/31/2024  . COLONOSCOPY  05/13/2025  . DEXA SCAN  Completed  . PNA vac Low Risk Adult  Completed    Cancer Screenings: Lung: Low Dose CT Chest recommended if Age 62-80 years, 30 pack-year currently smoking OR have quit w/in 15years. Patient does not qualify. Breast:  Up to date on Mammogram? No, ordered Up to date of Bone Density/Dexa? No, ordered Colorectal: up to date  Additional Screenings:  Hepatitis C Screening: declined Patient stated she would rather verbally let her eye doctor know to fax over records here than to fill out the release form    Plan:  I have personally reviewed and addressed the Medicare Annual Wellness questionnaire and have noted the following in the patient's chart:  A. Medical and social history B. Use of alcohol, tobacco or illicit drugs  C. Current medications and supplements D. Functional ability and status E.  Nutritional status F.  Physical activity G. Advance directives H. List of other physicians I.   Hospitalizations, surgeries, and ER visits in previous 12 months J.  [place] to include hearing, vision, cognitive, depression L. Referrals and appointments - none  In addition, I have reviewed and discussed with patient certain preventive protocols, quality metrics, and best practice recommendations. A written personalized care plan for preventive services as well as general preventive health recommendations were provided to patient.  See attached scanned questionnaire for additional information.   Signed,   [name], RN Nurse Health Advisor  Patient Concerns: None

## 2017-11-30 NOTE — Patient Instructions (Signed)
Joann Kennedy , Thank you for taking time to come for your Medicare Wellness Visit. I appreciate your ongoing commitment to your health goals. Please review the following plan we discussed and let me know if I can assist you in the future.   Screening recommendations/referrals: Colonoscopy up to date, due 1010/2026 Mammogram due, ordered Bone Density due, ordered Recommended yearly ophthalmology/optometry visit for glaucoma screening and checkup Recommended yearly dental visit for hygiene and checkup  Vaccinations: Influenza vaccine due 2019 fall season Pneumococcal vaccine up to date, completed Tdap vaccine up to date, due 12/31/2024 Shingles vaccine due    Advanced directives: Advance directive discussed with you today. I have provided a copy for you to complete at home and have notarized. Once this is complete please bring a copy in to our office so we can scan it into your chart.  Conditions/risks identified: none  Next appointment: Dr. Mariea Clonts 03/08/2018 @ 2:30pm             Joann Dense, RN 12/06/2018 @ 10am   Preventive Care 65 Years and Older, Female Preventive care refers to lifestyle choices and visits with your health care provider that can promote health and wellness. What does preventive care include?  A yearly physical exam. This is also called an annual well check.  Dental exams once or twice a year.  Routine eye exams. Ask your health care provider how often you should have your eyes checked.  Personal lifestyle choices, including:  Daily care of your teeth and gums.  Regular physical activity.  Eating a healthy diet.  Avoiding tobacco and drug use.  Limiting alcohol use.  Practicing safe sex.  Taking low-dose aspirin every day.  Taking vitamin and mineral supplements as recommended by your health care provider. What happens during an annual well check? The services and screenings done by your health care provider during your annual well check will  depend on your age, overall health, lifestyle risk factors, and family history of disease. Counseling  Your health care provider may ask you questions about your:  Alcohol use.  Tobacco use.  Drug use.  Emotional well-being.  Home and relationship well-being.  Sexual activity.  Eating habits.  History of falls.  Memory and ability to understand (cognition).  Work and work Statistician.  Reproductive health. Screening  You may have the following tests or measurements:  Height, weight, and BMI.  Blood pressure.  Lipid and cholesterol levels. These may be checked every 5 years, or more frequently if you are over 40 years old.  Skin check.  Lung cancer screening. You may have this screening every year starting at age 72 if you have a 30-pack-year history of smoking and currently smoke or have quit within the past 15 years.  Fecal occult blood test (FOBT) of the stool. You may have this test every year starting at age 56.  Flexible sigmoidoscopy or colonoscopy. You may have a sigmoidoscopy every 5 years or a colonoscopy every 10 years starting at age 62.  Hepatitis C blood test.  Hepatitis B blood test.  Sexually transmitted disease (STD) testing.  Diabetes screening. This is done by checking your blood sugar (glucose) after you have not eaten for a while (fasting). You may have this done every 1-3 years.  Bone density scan. This is done to screen for osteoporosis. You may have this done starting at age 76.  Mammogram. This may be done every 1-2 years. Talk to your health care provider about how often you should have  regular mammograms. Talk with your health care provider about your test results, treatment options, and if necessary, the need for more tests. Vaccines  Your health care provider may recommend certain vaccines, such as:  Influenza vaccine. This is recommended every year.  Tetanus, diphtheria, and acellular pertussis (Tdap, Td) vaccine. You may need a  Td booster every 10 years.  Zoster vaccine. You may need this after age 80.  Pneumococcal 13-valent conjugate (PCV13) vaccine. One dose is recommended after age 70.  Pneumococcal polysaccharide (PPSV23) vaccine. One dose is recommended after age 70. Talk to your health care provider about which screenings and vaccines you need and how often you need them. This information is not intended to replace advice given to you by your health care provider. Make sure you discuss any questions you have with your health care provider. Document Released: 08/17/2015 Document Revised: 04/09/2016 Document Reviewed: 05/22/2015 Elsevier Interactive Patient Education  2017 [place] Prevention in the Home Falls can cause injuries. They can happen to people of all ages. There are many things you can do to make your home safe and to help prevent falls. What can I do on the outside of my home?  Regularly fix the edges of walkways and driveways and fix any cracks.  Remove anything that might make you trip as you walk through a door, such as a raised step or threshold.  Trim any bushes or trees on the path to your home.  Use bright outdoor lighting.  Clear any walking paths of anything that might make someone trip, such as rocks or tools.  Regularly check to see if handrails are loose or broken. Make sure that both sides of any steps have handrails.  Any raised decks and porches should have guardrails on the edges.  Have any leaves, snow, or ice cleared regularly.  Use sand or salt on walking paths during winter.  Clean up any spills in your garage right away. This includes oil or grease spills. What can I do in the bathroom?  Use night lights.  Install grab bars by the toilet and in the tub and shower. Do not use towel bars as grab bars.  Use non-skid mats or decals in the tub or shower.  If you need to sit down in the shower, use a plastic, non-slip stool.  Keep the floor dry. Clean  up any water that spills on the floor as soon as it happens.  Remove soap buildup in the tub or shower regularly.  Attach bath mats securely with double-sided non-slip rug tape.  Do not have throw rugs and other things on the floor that can make you trip. What can I do in the bedroom?  Use night lights.  Make sure that you have a light by your bed that is easy to reach.  Do not use any sheets or blankets that are too big for your bed. They should not hang down onto the floor.  Have a firm chair that has side arms. You can use this for support while you get dressed.  Do not have throw rugs and other things on the floor that can make you trip. What can I do in the kitchen?  Clean up any spills right away.  Avoid walking on wet floors.  Keep items that you use a lot in easy-to-reach places.  If you need to reach something above you, use a strong step stool that has a grab bar.  Keep electrical cords out of the  way.  Do not use floor polish or wax that makes floors slippery. If you must use wax, use non-skid floor wax.  Do not have throw rugs and other things on the floor that can make you trip. What can I do with my stairs?  Do not leave any items on the stairs.  Make sure that there are handrails on both sides of the stairs and use them. Fix handrails that are broken or loose. Make sure that handrails are as long as the stairways.  Check any carpeting to make sure that it is firmly attached to the stairs. Fix any carpet that is loose or worn.  Avoid having throw rugs at the top or bottom of the stairs. If you do have throw rugs, attach them to the floor with carpet tape.  Make sure that you have a light switch at the top of the stairs and the bottom of the stairs. If you do not have them, ask someone to add them for you. What else can I do to help prevent falls?  Wear shoes that:  Do not have high heels.  Have rubber bottoms.  Are comfortable and fit you well.  Are  closed at the toe. Do not wear sandals.  If you use a stepladder:  Make sure that it is fully opened. Do not climb a closed stepladder.  Make sure that both sides of the stepladder are locked into place.  Ask someone to hold it for you, if possible.  Clearly mark and make sure that you can see:  Any grab bars or handrails.  First and last steps.  Where the edge of each step is.  Use tools that help you move around (mobility aids) if they are needed. These include:  Canes.  Walkers.  Scooters.  Crutches.  Turn on the lights when you go into a dark area. Replace any light bulbs as soon as they burn out.  Set up your furniture so you have a clear path. Avoid moving your furniture around.  If any of your floors are uneven, fix them.  If there are any pets around you, be aware of where they are.  Review your medicines with your doctor. Some medicines can make you feel dizzy. This can increase your chance of falling. Ask your doctor what other things that you can do to help prevent falls. This information is not intended to replace advice given to you by your health care provider. Make sure you discuss any questions you have with your health care provider. Document Released: 05/17/2009 Document Revised: 12/27/2015 Document Reviewed: 08/25/2014 Elsevier Interactive Patient Education  2017 [employer].   \

## 2017-12-07 ENCOUNTER — Telehealth: Payer: Self-pay | Admitting: *Deleted

## 2017-12-07 NOTE — Telephone Encounter (Signed)
Received Diabetic Supply Form from [facility] for Test Strips and Lancets. Filled out form and placed in Dr. Cyndi Lennert folder to review and sign.  To be faxed back to [place] Fax: [phone]

## 2017-12-25 LAB — HM DIABETES EYE EXAM

## 2018-01-14 ENCOUNTER — Other Ambulatory Visit: Payer: Self-pay | Admitting: Internal Medicine

## 2018-01-14 DIAGNOSIS — E119 Type 2 diabetes mellitus without complications: Secondary | ICD-10-CM

## 2018-03-01 ENCOUNTER — Other Ambulatory Visit: Payer: Medicare Other

## 2018-03-01 DIAGNOSIS — I10 Essential (primary) hypertension: Secondary | ICD-10-CM | POA: Diagnosis not present

## 2018-03-01 DIAGNOSIS — E1159 Type 2 diabetes mellitus with other circulatory complications: Secondary | ICD-10-CM | POA: Diagnosis not present

## 2018-03-01 DIAGNOSIS — E782 Mixed hyperlipidemia: Secondary | ICD-10-CM

## 2018-03-02 LAB — LIPID PANEL
Cholesterol: 169 mg/dL (ref <200)
HDL: 58 mg/dL (ref >50)
LDL Cholesterol (Calc): 88 mg/dL (calc)
Non-HDL Cholesterol (Calc): 111 mg/dL (calc) (ref <130)
Total CHOL/HDL Ratio: 2.9 (calc) (ref <5.0)
Triglycerides: 130 mg/dL (ref <150)

## 2018-03-02 LAB — HEMOGLOBIN A1C
Hgb A1c MFr Bld: 7.7 % of total Hgb — ABNORMAL HIGH (ref <5.7)
Mean Plasma Glucose: 174 (calc)
eAG (mmol/L): 9.7 (calc)

## 2018-03-02 LAB — BASIC METABOLIC PANEL
BUN/Creatinine Ratio: 31 (calc) — ABNORMAL HIGH (ref 6–22)
BUN: 26 mg/dL — ABNORMAL HIGH (ref 7–25)
CO2: 24 mmol/L (ref 20–32)
Calcium: 9.5 mg/dL (ref 8.6–10.4)
Chloride: 109 mmol/L (ref 98–110)
Creat: 0.84 mg/dL (ref 0.60–0.93)
Glucose, Bld: 142 mg/dL — ABNORMAL HIGH (ref 65–99)
Potassium: 4.2 mmol/L (ref 3.5–5.3)
Sodium: 141 mmol/L (ref 135–146)

## 2018-03-08 ENCOUNTER — Encounter: Payer: Self-pay | Admitting: Internal Medicine

## 2018-03-08 ENCOUNTER — Ambulatory Visit (INDEPENDENT_AMBULATORY_CARE_PROVIDER_SITE_OTHER): Payer: Medicare Other | Admitting: Internal Medicine

## 2018-03-08 VITALS — BP 124/78 | HR 70 | Temp 98.6°F | Ht 62.0 in | Wt 126.4 lb

## 2018-03-08 DIAGNOSIS — E782 Mixed hyperlipidemia: Secondary | ICD-10-CM | POA: Diagnosis not present

## 2018-03-08 DIAGNOSIS — I10 Essential (primary) hypertension: Secondary | ICD-10-CM

## 2018-03-08 DIAGNOSIS — E1159 Type 2 diabetes mellitus with other circulatory complications: Secondary | ICD-10-CM | POA: Diagnosis not present

## 2018-03-08 DIAGNOSIS — S46001S Unspecified injury of muscle(s) and tendon(s) of the rotator cuff of right shoulder, sequela: Secondary | ICD-10-CM | POA: Diagnosis not present

## 2018-03-08 DIAGNOSIS — I679 Cerebrovascular disease, unspecified: Secondary | ICD-10-CM | POA: Diagnosis not present

## 2018-03-08 DIAGNOSIS — E119 Type 2 diabetes mellitus without complications: Secondary | ICD-10-CM | POA: Diagnosis not present

## 2018-03-08 MED ORDER — LINAGLIPTIN 5 MG PO TABS: 5.0000 mg | ORAL_TABLET | Freq: Every day | ORAL | 3 refills | Status: DC

## 2018-03-08 NOTE — Progress Notes (Signed)
Location:  [facility] clinic Provider:  Nava Song L. Mariea Clonts, D.O., C.M.D.  Goals of Care:  Advanced Directives 11/30/2017  Does Patient Have a Medical Advance Directive? No  Would patient like information on creating a medical advance directive? Yes (MAU/Ambulatory/Procedural Areas - Information given)   Chief Complaint  Patient presents with  . Medical Management of Chronic Issues    Pt is being seen for a 4 month routine visit.   . Audit C Screening    Score of 0  . Health Maintenance    Needs DM foot exam: eye exam done-requesting report    HPI: Patient is a 72 y.o. female seen today for medical management of chronic diseases.    Pt reported to CMA that she could not get her tradjenta along with jardiance.  She was told that they work the same say so they would not fill both.  CMA is checking with pharmacy.  She's been on just jardiance for over a month.  hba1c 7.7 up from 7.6.  Sometimes she eats right, but her sister is visiting for more than a month and may not eat right.  She does walk a lot with her grandchildren--every morning around the neighborhood.  Weight stable.  She was taking the one pill at breakfast and other at her evening meal without a problem before pharmacy stopped filling it.  Pharmacy indicates pt has not tried to fill the tradjenta since November (was out of refills).    We have tried 2-3 previous times to get the eye exam from her relative who checks her eyes and we've never received it.  BP at goal with current regimen.  No dizziness.    She is due for her foot exam.  Done today.    No new stroke symptoms.   Arms still hurts her some.  Says she will die with that.  Is using a topical rub and sometimes uses heat.  Massages with warm water and uses herbals on it.  If she overdoes, she does those things and it helps.  Therapy helped a lot.    Past Medical History:  Diagnosis Date  . Disorder of bone and cartilage, unspecified   . Dizziness and giddiness   .  Hypercalcemia   . Hyperlipidemia   . Hypertension   . Hypopotassemia   . Other malaise and fatigue   . Other specified disease of white blood cells   . Special screening for malignant neoplasms, colon   . Type II or unspecified type diabetes mellitus without mention of complication, not stated as uncontrolled   . Unspecified vitamin D deficiency     Past Surgical History:  Procedure Laterality Date  . COLONOSCOPY  05/14/15   Dr. Collene Mares  . FRACTURE SURGERY  2001   Tibia & fibula  . TONSILLECTOMY  1996    Allergies  Allergen Reactions  . Metformin And Related Diarrhea    Outpatient Encounter Medications as of 03/08/2018  Medication Sig  . aspirin EC 325 MG tablet Take 1 tablet (325 mg total) by mouth daily.  . Calcium Carbonate-Vitamin D 600-400 MG-UNIT tablet Take 1 tablet by mouth daily.  Marland Kitchen glucose blood (FREESTYLE LITE) test strip Use as instructed to test blood sugar once daily dx E11.9  . JARDIANCE 25 MG TABS tablet TAKE 1 TABLET BY MOUTH ONCE DAILY  . Lancets (FREESTYLE) lancets Use as directed to test blood sugar once daily  . lisinopril-hydrochlorothiazide (ZESTORETIC) 20-12.5 MG tablet Take 1 tablet by mouth daily.  Marland Kitchen  omeprazole (PRILOSEC) 40 MG capsule Take 40 mg by mouth daily. Reported on 09/10/2015  . pravastatin (PRAVACHOL) 80 MG tablet Take 1 tablet (80 mg total) by mouth daily.  . TRADJENTA 5 MG TABS tablet take 1 tablet by mouth once daily   No facility-administered encounter medications on file as of 03/08/2018.     Review of Systems:  Review of Systems  Constitutional: Negative for chills, fever, malaise/fatigue and weight loss.  HENT: Negative for congestion and hearing loss.   Eyes: Negative for blurred vision.  Respiratory: Negative for cough and shortness of breath.   Cardiovascular: Negative for chest pain, palpitations and leg swelling.  Gastrointestinal: Negative for abdominal pain, blood in stool, constipation, diarrhea and melena.  Genitourinary:  Negative for dysuria, frequency and urgency.  Musculoskeletal: Negative for falls and joint pain.  Skin: Negative for itching and rash.  Neurological: Negative for dizziness, tingling, tremors, sensory change, loss of consciousness and weakness.  Psychiatric/Behavioral: Negative for depression and memory loss. The patient is not nervous/anxious and does not have insomnia.     Health Maintenance  Topic Date Due  . OPHTHALMOLOGY EXAM  11/02/1955  . FOOT EXAM  01/13/2017  . MAMMOGRAM  10/21/2017  . INFLUENZA VACCINE  03/04/2018  . Hepatitis C Screening  04/04/2019 (Originally 1945/11/02)  . HEMOGLOBIN A1C  09/01/2018  . TETANUS/TDAP  12/31/2024  . COLONOSCOPY  05/13/2025  . DEXA SCAN  Completed  . PNA vac Low Risk Adult  Completed    Physical Exam: Vitals:   03/08/18 1439  BP: 124/78  Pulse: 70  Temp: 98.6 F (37 C)  TempSrc: Oral  SpO2: 98%  Weight: 126 lb 6.4 oz (57.3 kg)  Height: 5\' 2"  (1.575 m)   Body mass index is 23.12 kg/m. Physical Exam  Constitutional: She is oriented to person, place, and time. She appears well-developed and well-nourished. No distress.  HENT:  Head: Normocephalic and atraumatic.  Cardiovascular: Normal rate, regular rhythm, normal heart sounds and intact distal pulses.  Pulmonary/Chest: Effort normal and breath sounds normal. No respiratory distress.  Abdominal: Bowel sounds are normal.  Musculoskeletal: She exhibits tenderness.  Right shoulder with abduction  Neurological: She is alert and oriented to person, place, and time. No cranial nerve deficit.  Skin: Skin is warm and dry. Capillary refill takes less than 2 seconds.  Psychiatric: She has a normal mood and affect.    Labs reviewed: Basic Metabolic Panel: Recent Labs    07/08/17 0848 11/03/17 0904 03/01/18 0836  NA 141 143 141  K 4.2 4.3 4.2  CL 107 104 109  CO2 26 27 24   GLUCOSE 132* 122* 142*  BUN 24 24 26*  CREATININE 0.69 0.71 0.84  CALCIUM 9.3 9.5 9.5   Liver Function  Tests: Recent Labs    07/03/17 0307 11/03/17 0904  AST 19 15  ALT 23 15  ALKPHOS 83  --   BILITOT 0.5 0.4  PROT 7.7 7.5  ALBUMIN 4.4  --    No results for input(s): LIPASE, AMYLASE in the last 8760 hours. No results for input(s): AMMONIA in the last 8760 hours. CBC: Recent Labs    07/03/17 0307 11/03/17 0904  WBC 12.7* 9.1  NEUTROABS 5.2 4,195  HGB 12.8 12.2  HCT 36.4 36.3  MCV 86.5 87.3  PLT 453* 475*   Lipid Panel: Recent Labs    07/08/17 0848 11/03/17 0904 03/01/18 0836  CHOL 150 179 169  HDL 63 57 58  LDLCALC 70 95 88  TRIG 89  160* 130  CHOLHDL 2.4 3.1 2.9   Lab Results  Component Value Date   HGBA1C 7.7 (H) 03/01/2018    Assessment/Plan 1. Type 2 diabetes mellitus with other circulatory complication, without long-term current use of insulin (HCC) -control not ideal, but has not been on regimen prescribed probably for 6 mos based on not refilling the tradjenta with the jardiance - Basic metabolic panel; Future - Lipid panel; Future - Hemoglobin A1c; Future  2. Mixed hyperlipidemia -  Cont pravachol max dose and f/u lab - Lipid panel; Future -ideally should be on high dose statin with crestor or lipitor  3. Essential hypertension -bp at goal, cont same regimen - Basic metabolic panel; Future  4. Injury of right rotator cuff, sequela -continues with some discomfort if she uses her arm like in gardening  5. Cerebrovascular disease -s/p prior stroke, needs to be consistent with her diabetes medications, cholesterol meds and diet and exercise, bp control - Lipid panel; Future - Hemoglobin A1c; Future  6. Type 2 diabetes mellitus without complication, without long-term current use of insulin (HCC) - cont jardiance and refill tradjenta - linagliptin (TRADJENTA) 5 MG TABS tablet; Take 1 tablet (5 mg total) by mouth daily.  Dispense: 30 tablet; Refill: 3 . Labs/tests ordered:   Orders Placed This Encounter  Procedures  . Basic metabolic panel     Standing Status:   Future    Standing Expiration Date:   11/07/2018    Order Specific Question:   Has the patient fasted?    Answer:   Yes  . Lipid panel    Standing Status:   Future    Standing Expiration Date:   11/07/2018    Order Specific Question:   Has the patient fasted?    Answer:   Yes  . Hemoglobin A1c    Standing Status:   Future    Standing Expiration Date:   11/07/2018  . HM DIABETES EYE EXAM    This external order was created through the Results Console.    Next appt:  07/12/2018 med mgt, labs before  Denaja Verhoeven L. Baneen Wieseler, D.O. [facility] [address], [place] [address] Cell Phone (Mon-Fri 8am-5pm):  [phone] On Call:  [phone] & follow prompts after 5pm & weekends Office Phone:  [phone] Office Fax:  [phone]

## 2018-03-08 NOTE — Patient Instructions (Signed)
Please bring [place] your eye report.

## 2018-05-19 ENCOUNTER — Other Ambulatory Visit: Payer: Self-pay | Admitting: Internal Medicine

## 2018-05-19 DIAGNOSIS — E119 Type 2 diabetes mellitus without complications: Secondary | ICD-10-CM

## 2018-07-07 ENCOUNTER — Other Ambulatory Visit: Payer: Medicare Other

## 2018-07-12 ENCOUNTER — Ambulatory Visit: Payer: Medicare Other | Admitting: Internal Medicine

## 2018-08-29 ENCOUNTER — Other Ambulatory Visit: Payer: Self-pay | Admitting: Internal Medicine

## 2018-08-29 DIAGNOSIS — E119 Type 2 diabetes mellitus without complications: Secondary | ICD-10-CM

## 2018-08-30 ENCOUNTER — Ambulatory Visit (INDEPENDENT_AMBULATORY_CARE_PROVIDER_SITE_OTHER): Payer: Medicare HMO | Admitting: Family

## 2018-08-30 ENCOUNTER — Encounter: Payer: Self-pay | Admitting: Family

## 2018-08-30 VITALS — BP 110/70 | HR 77 | Temp 98.0°F | Ht 62.0 in | Wt 130.6 lb

## 2018-08-30 DIAGNOSIS — J00 Acute nasopharyngitis [common cold]: Secondary | ICD-10-CM

## 2018-08-30 DIAGNOSIS — J029 Acute pharyngitis, unspecified: Secondary | ICD-10-CM | POA: Diagnosis not present

## 2018-08-30 NOTE — Patient Instructions (Addendum)
1.Chloraseptic spray to throat every 4 hours as needed for pain x 2 days. 2. Loratadine 10 mg tablet take one by mouth once daily x 14 days  3. Drink plenty of fluid and warm soup/tea to sooth your throat. 4. Notify provider if running any fever> 100.5 or symptoms worsen.   Sore Throat When you have a sore throat, your throat may feel:  Tender.  Burning.  Irritated.  Scratchy.  Painful when you swallow.  Painful when you talk. Many things can cause a sore throat, such as:  An infection.  Allergies.  Dry air.  Smoke or pollution.  Radiation treatment.  Gastroesophageal reflux disease (GERD).  A tumor. A sore throat can be the first sign of another sickness. It can happen with other problems, like:  Coughing.  Sneezing.  Fever.  Swelling in the neck. Most sore throats go away without treatment. Follow these instructions at home:      Take over-the-counter medicines only as told by your doctor. ? If your child has a sore throat, do not give your child aspirin.  Drink enough fluids to keep your pee (urine) pale yellow.  Rest when you feel you need to.  To help with pain: ? Sip warm liquids, such as broth, herbal tea, or warm water. ? Eat or drink cold or frozen liquids, such as frozen ice pops. ? Gargle with a salt-water mixture 3-4 times a day or as needed. To make a salt-water mixture, add -1 tsp (3-6 g) of salt to 1 cup (237 mL) of warm water. Mix it until you cannot see the salt anymore. ? Suck on hard candy or throat lozenges. ? Put a cool-mist humidifier in your bedroom at night. ? Sit in the bathroom with the door closed for 5-10 minutes while you run hot water in the shower.  Do not use any products that contain nicotine or tobacco, such as cigarettes, e-cigarettes, and chewing tobacco. If you need help quitting, ask your doctor.  Wash your hands well and often with soap and water. If soap and water are not available, use hand sanitizer. Contact  a doctor if:  You have a fever for more than 2-3 days.  You keep having symptoms for more than 2-3 days.  Your throat does not get better in 7 days.  You have a fever and your symptoms suddenly get worse.  Your child who is 3 months to 79 years old has a temperature of 102.41F (39C) or higher. Get help right away if:  You have trouble breathing.  You cannot swallow fluids, soft foods, or your saliva.  You have swelling in your throat or neck that gets worse.  You keep feeling sick to your stomach (nauseous).  You keep throwing up (vomiting). Summary  A sore throat is pain, burning, irritation, or scratchiness in the throat. Many things can cause a sore throat.  Take over-the-counter medicines only as told by your doctor. Do not give your child aspirin.  Drink plenty of fluids, and rest as needed.  Contact a doctor if your symptoms get worse or your sore throat does not get better within 7 days. This information is not intended to replace advice given to you by your health care provider. Make sure you discuss any questions you have with your health care provider. Document Released: 04/29/2008 Document Revised: 12/21/2017 Document Reviewed: 12/21/2017 Elsevier Interactive Patient Education  2019 [employer].

## 2018-08-30 NOTE — Progress Notes (Signed)
Provider: Haynes Giannotti FNP-C  Gayland Curry, DO  Patient Care Team: Gayland Curry, DO as PCP - General (Geriatric Medicine)  Extended Emergency Contact Information Primary Emergency Contact: Elmes,Liyana Abran Richard) Address: [place] [address] [address] [place] Phone: [phone] Relation: Spouse   Goals of care: Advanced Directive information Advanced Directives 11/30/2017  Does Patient Have a Medical Advance Directive? No  Would patient like information on creating a medical advance directive? Yes (MAU/Ambulatory/Procedural Areas - Information given)     Chief Complaint  Patient presents with  . Acute Visit    Experiencing sore throat, eye problems  and congestion for about a week. Taking dayquil  for 2 days states makes throat really dry and can't cough.     HPI:  Pt is a 73 y.o. female seen today for an acute visit for evaluation of sore throat,congestion,watery eyes and dry cough X 2 days.she is here escorted by husband today.she has had sore throat painful to swallow.she states her eyes were red and was given some eye drops by her daughter who is an Ophthalmologist with much improvement though eyes still watery but redness resolved.Husband states she is unable to sleep in the bed due to nasal congestion prefers to sleep on recliner.she has taken DayQuil x 2 days but stopped due to pain with swallowing.she denies any fever,chills,shortness of breath or edema.No abrupt weight gain.she states her grandson and husband had similar symptoms prior to getting it.   Past Medical History:  Diagnosis Date  . Disorder of bone and cartilage, unspecified   . Dizziness and giddiness   . Hypercalcemia   . Hyperlipidemia   . Hypertension   . Hypopotassemia   . Other malaise and fatigue   . Other specified disease of white blood cells   . Special screening for malignant neoplasms, colon   . Type II or unspecified type diabetes mellitus  without mention of complication, not stated as uncontrolled   . Unspecified vitamin D deficiency    Past Surgical History:  Procedure Laterality Date  . COLONOSCOPY  05/14/15   Dr. Collene Mares  . FRACTURE SURGERY  2001   Tibia & fibula  . TONSILLECTOMY  1996    Allergies  Allergen Reactions  . Metformin And Related Diarrhea    Outpatient Encounter Medications as of 08/30/2018  Medication Sig  . aspirin EC 325 MG tablet Take 1 tablet (325 mg total) by mouth daily.  . Calcium Carbonate-Vitamin D 600-400 MG-UNIT tablet Take 1 tablet by mouth daily.  Marland Kitchen glucose blood (FREESTYLE LITE) test strip Use as instructed to test blood sugar once daily dx E11.9  . JARDIANCE 25 MG TABS tablet TAKE 1 TABLET BY MOUTH ONCE DAILY  . Lancets (FREESTYLE) lancets Use as directed to test blood sugar once daily  . linagliptin (TRADJENTA) 5 MG TABS tablet Take 1 tablet (5 mg total) by mouth daily.  Marland Kitchen lisinopril-hydrochlorothiazide (ZESTORETIC) 20-12.5 MG tablet Take 1 tablet by mouth daily.  Marland Kitchen omeprazole (PRILOSEC) 40 MG capsule Take 40 mg by mouth daily. Reported on 09/10/2015  . pravastatin (PRAVACHOL) 80 MG tablet Take 1 tablet (80 mg total) by mouth daily.   No facility-administered encounter medications on file as of 08/30/2018.     Review of Systems  Constitutional: Negative for appetite change, chills, fatigue and fever.  HENT: Positive for congestion, rhinorrhea, sneezing and sore throat. Negative for ear discharge, ear pain, hearing loss, postnasal drip, sinus pressure  and sinus pain.   Eyes: Negative for pain, discharge, redness, itching and visual disturbance.  Respiratory: Positive for cough. Negative for chest tightness, shortness of breath and wheezing.   Cardiovascular: Negative for chest pain, palpitations and leg swelling.  Gastrointestinal: Negative for abdominal distention, abdominal pain, constipation, diarrhea, nausea and vomiting.  Skin: Negative for color change, pallor and rash.    Neurological: Negative for dizziness, light-headedness and headaches.    Immunization History  Administered Date(s) Administered  . Influenza Whole 08/10/2013  . Influenza-Unspecified 05/05/2015  . Pneumococcal Conjugate-13 04/21/2016  . Pneumococcal Polysaccharide-23 07/20/2017  . Tdap 01/01/2015   Pertinent  Health Maintenance Due  Topic Date Due  . MAMMOGRAM  10/21/2017  . INFLUENZA VACCINE  03/04/2018  . HEMOGLOBIN A1C  09/01/2018  . OPHTHALMOLOGY EXAM  12/26/2018  . FOOT EXAM  03/09/2019  . COLONOSCOPY  05/13/2025  . DEXA SCAN  Completed  . PNA vac Low Risk Adult  Completed   Fall Risk  08/30/2018 03/08/2018 11/30/2017 07/20/2017 03/23/2017  Falls in the past year? 0 No No No No  Number falls in past yr: 0 - - - -  Injury with Fall? 0 - - - -    Vitals:   08/30/18 1615  BP: 110/70  Pulse: 77  Temp: 98 F (36.7 C)  TempSrc: Oral  SpO2: 96%  Weight: 130 lb 9.6 oz (59.2 kg)  Height: 5\' 2"  (1.575 m)   Body mass index is 23.89 kg/m. Physical Exam Constitutional:      General: She is not in acute distress.    Appearance: She is normal weight.  HENT:     Head: Normocephalic.     Right Ear: Tympanic membrane, ear canal and external ear normal. There is no impacted cerumen.     Left Ear: Tympanic membrane, ear canal and external ear normal. There is no impacted cerumen.     Nose: Rhinorrhea present. No congestion.     Mouth/Throat:     Mouth: Mucous membranes are moist.     Pharynx: Oropharynx is clear. No oropharyngeal exudate or posterior oropharyngeal erythema.  Eyes:     General: No scleral icterus.       Right eye: No discharge.        Left eye: No discharge.     Conjunctiva/sclera: Conjunctivae normal.     Pupils: Pupils are equal, round, and reactive to light.     Comments: Corrective lens in place   Neck:     Musculoskeletal: Normal range of motion. No neck rigidity or muscular tenderness.  Cardiovascular:     Rate and Rhythm: Normal rate and regular  rhythm.     Pulses: Normal pulses.     Heart sounds: Normal heart sounds. No murmur. No friction rub. No gallop.   Pulmonary:     Effort: Pulmonary effort is normal. No respiratory distress.     Breath sounds: Normal breath sounds. No wheezing, rhonchi or rales.  Chest:     Chest wall: No tenderness.  Abdominal:     General: Bowel sounds are normal. There is no distension.     Palpations: Abdomen is soft. There is no mass.     Tenderness: There is no abdominal tenderness. There is no right CVA tenderness, left CVA tenderness, guarding or rebound.  Musculoskeletal: Normal range of motion.        General: No swelling or tenderness.     Right lower leg: No edema.     Left lower leg: No edema.  Lymphadenopathy:     Cervical: No cervical adenopathy.  Skin:    General: Skin is warm and dry.     Coloration: Skin is not pale.     Findings: No erythema, lesion or rash.  Neurological:     Mental Status: She is alert and oriented to person, place, and time.     Gait: Gait normal.  Psychiatric:        Mood and Affect: Mood normal.        Behavior: Behavior normal.        Thought Content: Thought content normal.        Judgment: Judgment normal.     Labs reviewed: Recent Labs    11/03/17 0904 03/01/18 0836  NA 143 141  K 4.3 4.2  CL 104 109  CO2 27 24  GLUCOSE 122* 142*  BUN 24 26*  CREATININE 0.71 0.84  CALCIUM 9.5 9.5   Recent Labs    11/03/17 0904  AST 15  ALT 15  BILITOT 0.4  PROT 7.5   Recent Labs    11/03/17 0904  WBC 9.1  NEUTROABS 4,195  HGB 12.2  HCT 36.3  MCV 87.3  PLT 475*   Lab Results  Component Value Date   TSH 0.73 08/18/2016   Lab Results  Component Value Date   HGBA1C 7.7 (H) 03/01/2018   Lab Results  Component Value Date   CHOL 169 03/01/2018   HDL 58 03/01/2018   LDLCALC 88 03/01/2018   TRIG 130 03/01/2018   CHOLHDL 2.9 03/01/2018    Significant Diagnostic Results in last 30 days:  No results found.  Assessment/Plan 1. Acute  sore throat Afebrile.Negative exam findings.suspect possible viral. Encouraged to gurgle throat with warm salt water daily. - Chloraseptic spray to throat every 4 hours as needed for pain x 2 days for pain. - Drink plenty of fluid and warm soup/tea to sooth your throat. - Notify provider if running any fever> 100.5 or symptoms worsen. - OTC Tylenol 325 mg tablet one by mouth every 6 hours as needed for pain or fever recommended.   2. Acute rhinitis Clear rhinorrhea associated with sneezing and watery eyes.Will benefit from antihistamine.start on loratadine 10 mg tablet one by mouth daily.encouraged fluid intake.  Family/ staff Communication: Reviewed plan of care with patient and Husband.   Labs/tests ordered: None   Glenyce Randle C Swayzee Wadley, NP

## 2018-09-21 ENCOUNTER — Encounter: Payer: Self-pay | Admitting: Family

## 2018-10-22 ENCOUNTER — Other Ambulatory Visit: Payer: Self-pay | Admitting: Internal Medicine

## 2018-10-22 DIAGNOSIS — E785 Hyperlipidemia, unspecified: Secondary | ICD-10-CM

## 2018-10-22 DIAGNOSIS — E119 Type 2 diabetes mellitus without complications: Secondary | ICD-10-CM

## 2018-10-22 DIAGNOSIS — I10 Essential (primary) hypertension: Secondary | ICD-10-CM

## 2018-10-23 ENCOUNTER — Other Ambulatory Visit: Payer: Self-pay | Admitting: Internal Medicine

## 2018-10-23 DIAGNOSIS — E782 Mixed hyperlipidemia: Secondary | ICD-10-CM

## 2018-10-23 DIAGNOSIS — I10 Essential (primary) hypertension: Secondary | ICD-10-CM

## 2018-10-23 DIAGNOSIS — E119 Type 2 diabetes mellitus without complications: Secondary | ICD-10-CM

## 2018-10-26 ENCOUNTER — Other Ambulatory Visit: Payer: Medicare HMO

## 2018-11-01 ENCOUNTER — Ambulatory Visit: Payer: Medicare HMO | Admitting: Internal Medicine

## 2018-12-06 ENCOUNTER — Encounter: Payer: Medicare Other | Admitting: Family

## 2018-12-06 ENCOUNTER — Ambulatory Visit: Payer: Self-pay

## 2018-12-07 ENCOUNTER — Encounter: Payer: Medicare HMO | Admitting: Family

## 2018-12-20 ENCOUNTER — Ambulatory Visit: Payer: Self-pay | Admitting: Internal Medicine

## 2019-01-19 ENCOUNTER — Other Ambulatory Visit: Payer: Self-pay

## 2019-01-19 ENCOUNTER — Other Ambulatory Visit: Payer: Medicare HMO

## 2019-01-19 DIAGNOSIS — I10 Essential (primary) hypertension: Secondary | ICD-10-CM | POA: Diagnosis not present

## 2019-01-19 DIAGNOSIS — E119 Type 2 diabetes mellitus without complications: Secondary | ICD-10-CM | POA: Diagnosis not present

## 2019-01-19 DIAGNOSIS — E785 Hyperlipidemia, unspecified: Secondary | ICD-10-CM | POA: Diagnosis not present

## 2019-01-20 LAB — BASIC METABOLIC PANEL WITH GFR
BUN: 17 mg/dL (ref 7–25)
CO2: 28 mmol/L (ref 20–32)
Calcium: 9.5 mg/dL (ref 8.6–10.4)
Chloride: 105 mmol/L (ref 98–110)
Creat: 0.76 mg/dL (ref 0.60–0.93)
GFR, Est African American: 91 mL/min/{1.73_m2} (ref >=60)
GFR, Est Non African American: 79 mL/min/{1.73_m2} (ref >=60)
Glucose, Bld: 162 mg/dL — ABNORMAL HIGH (ref 65–99)
Potassium: 3.9 mmol/L (ref 3.5–5.3)
Sodium: 140 mmol/L (ref 135–146)

## 2019-01-20 LAB — HEMOGLOBIN A1C
Hgb A1c MFr Bld: 8.2 % of total Hgb — ABNORMAL HIGH (ref <5.7)
Mean Plasma Glucose: 189 (calc)
eAG (mmol/L): 10.4 (calc)

## 2019-01-20 LAB — LIPID PANEL
Cholesterol: 176 mg/dL (ref <200)
HDL: 56 mg/dL (ref >=50)
LDL Cholesterol (Calc): 92 mg/dL (calc)
Non-HDL Cholesterol (Calc): 120 mg/dL (calc) (ref <130)
Total CHOL/HDL Ratio: 3.1 (calc) (ref <5.0)
Triglycerides: 187 mg/dL — ABNORMAL HIGH (ref <150)

## 2019-01-24 ENCOUNTER — Encounter: Payer: Self-pay | Admitting: Internal Medicine

## 2019-01-24 ENCOUNTER — Other Ambulatory Visit: Payer: Self-pay

## 2019-01-24 ENCOUNTER — Ambulatory Visit (INDEPENDENT_AMBULATORY_CARE_PROVIDER_SITE_OTHER): Payer: Medicare HMO | Admitting: Internal Medicine

## 2019-01-24 VITALS — BP 118/70 | HR 69 | Temp 97.9°F | Ht 62.0 in | Wt 130.0 lb

## 2019-01-24 DIAGNOSIS — E1159 Type 2 diabetes mellitus with other circulatory complications: Secondary | ICD-10-CM | POA: Diagnosis not present

## 2019-01-24 DIAGNOSIS — I10 Essential (primary) hypertension: Secondary | ICD-10-CM

## 2019-01-24 DIAGNOSIS — S46001S Unspecified injury of muscle(s) and tendon(s) of the rotator cuff of right shoulder, sequela: Secondary | ICD-10-CM

## 2019-01-24 DIAGNOSIS — E782 Mixed hyperlipidemia: Secondary | ICD-10-CM

## 2019-01-24 MED ORDER — ROSUVASTATIN CALCIUM 10 MG PO TABS: 10.0000 mg | ORAL_TABLET | Freq: Every day | ORAL | 3 refills | Status: DC

## 2019-01-24 NOTE — Progress Notes (Signed)
Location:  [facility] Provider:  Brittnye Josephs L. Mariea Clonts, D.O., C.M.D.  Goals of Care:  Advanced Directives 11/30/2017  Does Patient Have a Medical Advance Directive? No  Would patient like information on creating a medical advance directive? Yes (MAU/Ambulatory/Procedural Areas - Information given)     Chief Complaint  Patient presents with  . Medical Management of Chronic Issues    follow-up, discuss labs    HPI: Patient is a 73 y.o. female seen today for medical management of chronic diseases.    I have not seen her since August of 2019.    Feels the same as before.  No new concerns.   Sugar average 8.2 up from 7.7.  Says it was due to lockdown--eating and having nothing to do.  She had diarrhea with metformin before so on jardiance and tradjenta.  She is not taking her tradjenta daily.  Says she takes it only when she has a heavy meal--was then taking at night.    BP is good.    Discussed cholesterol still high with maximum dose of pravachol.  Shoulder hurts if she lifts heavy, but otherwise it's ok.  Will take tylenol and it goes away. Past Medical History:  Diagnosis Date  . Disorder of bone and cartilage, unspecified   . Dizziness and giddiness   . Hypercalcemia   . Hyperlipidemia   . Hypertension   . Hypopotassemia   . Other malaise and fatigue   . Other specified disease of white blood cells   . Special screening for malignant neoplasms, colon   . Type II or unspecified type diabetes mellitus without mention of complication, not stated as uncontrolled   . Unspecified vitamin D deficiency     Past Surgical History:  Procedure Laterality Date  . COLONOSCOPY  05/14/15   Dr. Collene Mares  . FRACTURE SURGERY  2001   Tibia & fibula  . TONSILLECTOMY  1996    Allergies  Allergen Reactions  . Metformin And Related Diarrhea    Outpatient Encounter Medications as of 01/24/2019  Medication Sig  . aspirin EC 325 MG tablet Take 1 tablet (325 mg total) by mouth daily.  .  Calcium Carbonate-Vitamin D 600-400 MG-UNIT tablet Take 1 tablet by mouth daily.  Marland Kitchen glucose blood (FREESTYLE LITE) test strip Use as instructed to test blood sugar once daily dx E11.9  . JARDIANCE 25 MG TABS tablet TAKE 1 TABLET BY MOUTH ONCE DAILY  . Lancets (FREESTYLE) lancets Use as directed to test blood sugar once daily  . lisinopril-hydrochlorothiazide (PRINZIDE,ZESTORETIC) 20-12.5 MG tablet TAKE 1 TABLET BY MOUTH DAILY, TO REPLACE BENAZEPRIL-HCTZ  . omeprazole (PRILOSEC) 40 MG capsule Take 40 mg by mouth daily. Reported on 09/10/2015  . pravastatin (PRAVACHOL) 80 MG tablet TAKE 1 TABLET BY MOUTH ONCE DAILY  . TRADJENTA 5 MG TABS tablet TAKE 1 TABLET(5 MG) BY MOUTH DAILY   No facility-administered encounter medications on file as of 01/24/2019.     Review of Systems:  Review of Systems  Constitutional: Negative for chills, fever and malaise/fatigue.  HENT: Positive for hearing loss.   Eyes: Negative for blurred vision.       Glasses  Respiratory: Negative for cough and shortness of breath.   Cardiovascular: Negative for chest pain, palpitations and leg swelling.  Gastrointestinal: Negative for abdominal pain, blood in stool, constipation, diarrhea and melena.  Genitourinary: Negative for dysuria.  Musculoskeletal: Positive for joint pain. Negative for falls.       Chronic shoulder pain worsened by lifting heavy  items  Skin: Negative for itching and rash.  Neurological: Negative for dizziness and loss of consciousness.  Endo/Heme/Allergies: Does not bruise/bleed easily.  Psychiatric/Behavioral: Positive for memory loss. Negative for depression. The patient is not nervous/anxious and does not have insomnia.     Health Maintenance  Topic Date Due  . MAMMOGRAM  10/21/2017  . OPHTHALMOLOGY EXAM  12/26/2018  . Hepatitis C Screening  04/04/2019 (Originally 10/10/1945)  . INFLUENZA VACCINE  03/05/2019  . FOOT EXAM  03/09/2019  . HEMOGLOBIN A1C  07/21/2019  . TETANUS/TDAP  12/31/2024   . COLONOSCOPY  05/13/2025  . DEXA SCAN  Completed  . PNA vac Low Risk Adult  Completed    Physical Exam: Vitals:   01/24/19 1531  BP: 118/70  Pulse: 69  Temp: 97.9 F (36.6 C)  TempSrc: Oral  SpO2: 96%  Weight: 130 lb (59 kg)  Height: 5\' 2"  (1.575 m)   Body mass index is 23.78 kg/m. Physical Exam Vitals signs reviewed.  Constitutional:      Appearance: Normal appearance.  Cardiovascular:     Rate and Rhythm: Normal rate and regular rhythm.     Pulses: Normal pulses.     Heart sounds: Normal heart sounds.  Pulmonary:     Effort: Pulmonary effort is normal.     Breath sounds: Normal breath sounds.  Musculoskeletal: Normal range of motion.  Skin:    General: Skin is warm and dry.  Neurological:     General: No focal deficit present.     Mental Status: She is alert and oriented to person, place, and time.  Psychiatric:        Mood and Affect: Mood normal.     Labs reviewed: Basic Metabolic Panel: Recent Labs    03/01/18 0836 01/19/19 0831  NA 141 140  K 4.2 3.9  CL 109 105  CO2 24 28  GLUCOSE 142* 162*  BUN 26* 17  CREATININE 0.84 0.76  CALCIUM 9.5 9.5   Liver Function Tests: No results for input(s): AST, ALT, ALKPHOS, BILITOT, PROT, ALBUMIN in the last 8760 hours. No results for input(s): LIPASE, AMYLASE in the last 8760 hours. No results for input(s): AMMONIA in the last 8760 hours. CBC: No results for input(s): WBC, NEUTROABS, HGB, HCT, MCV, PLT in the last 8760 hours. Lipid Panel: Recent Labs    03/01/18 0836 01/19/19 0831  CHOL 169 176  HDL 58 56  LDLCALC 88 92  TRIG 130 187*  CHOLHDL 2.9 3.1   Lab Results  Component Value Date   HGBA1C 8.2 (H) 01/19/2019    Assessment/Plan 1. Mixed hyperlipidemia - stop pravachol 80 mg which has still not gotten her to goal of LDL <70 - start rosuvastatin (CRESTOR) 10 MG tablet; Take 1 tablet (10 mg total) by mouth daily.  Dispense: 90 tablet; Refill: 3 - recheck Lipid panel; Future  2. Type 2  diabetes mellitus with other circulatory complication, without long-term current use of insulin (HCC) -uncontrolled at present -cont jardiance, start taking tradjenta daily not just with big meals a few days a week (she made that decision on her own) - Hemoglobin A1c; Future  3. Essential hypertension -bp well controlled with current regimen  4. Injury of right rotator cuff, sequela -continues to bother with heavy lifting which she's meant to avoid  Labs/tests ordered:   Lab Orders     Hemoglobin A1c     Lipid panel  Next appt:  4 mos med mgt, fasting labs before  Nhyira Leano L. Mariea Clonts,  D.O. [facility] [phone], [place] [address] Cell Phone (Mon-Fri 8am-5pm):  [phone] On Call:  [phone] & follow prompts after 5pm & weekends Office Phone:  [phone] Office Fax:  [phone]

## 2019-01-24 NOTE — Patient Instructions (Addendum)
Please take the tradjenta EVERY day.  Your sugar average has gone up over 8 which is too high.  It puts you at risk for damage to your eyes and the nerves in your feet.  Please get your mammogram and bone density tests scheduled.

## 2019-01-28 DIAGNOSIS — M545 Low back pain: Secondary | ICD-10-CM | POA: Diagnosis not present

## 2019-01-28 DIAGNOSIS — W19XXXA Unspecified fall, initial encounter: Secondary | ICD-10-CM | POA: Diagnosis not present

## 2019-01-28 DIAGNOSIS — M533 Sacrococcygeal disorders, not elsewhere classified: Secondary | ICD-10-CM | POA: Diagnosis not present

## 2019-02-01 ENCOUNTER — Other Ambulatory Visit: Payer: Self-pay | Admitting: Internal Medicine

## 2019-02-01 DIAGNOSIS — E119 Type 2 diabetes mellitus without complications: Secondary | ICD-10-CM

## 2019-03-02 ENCOUNTER — Other Ambulatory Visit: Payer: Self-pay | Admitting: Internal Medicine

## 2019-03-02 DIAGNOSIS — E119 Type 2 diabetes mellitus without complications: Secondary | ICD-10-CM

## 2019-05-02 ENCOUNTER — Other Ambulatory Visit: Payer: Self-pay | Admitting: *Deleted

## 2019-05-02 DIAGNOSIS — E119 Type 2 diabetes mellitus without complications: Secondary | ICD-10-CM

## 2019-05-02 DIAGNOSIS — I10 Essential (primary) hypertension: Secondary | ICD-10-CM

## 2019-05-02 MED ORDER — JARDIANCE 25 MG PO TABS: 25.0000 mg | ORAL_TABLET | Freq: Every day | ORAL | 1 refills | Status: DC

## 2019-05-02 MED ORDER — LISINOPRIL-HYDROCHLOROTHIAZIDE 20-12.5 MG PO TABS: ORAL_TABLET | ORAL | 1 refills | Status: DC

## 2019-05-02 NOTE — Telephone Encounter (Signed)
[facility]

## 2019-05-23 ENCOUNTER — Other Ambulatory Visit: Payer: Medicare HMO

## 2019-05-23 ENCOUNTER — Other Ambulatory Visit: Payer: Self-pay

## 2019-05-23 DIAGNOSIS — E782 Mixed hyperlipidemia: Secondary | ICD-10-CM | POA: Diagnosis not present

## 2019-05-23 DIAGNOSIS — E1159 Type 2 diabetes mellitus with other circulatory complications: Secondary | ICD-10-CM

## 2019-05-24 LAB — LIPID PANEL
Cholesterol: 132 mg/dL (ref <200)
HDL: 53 mg/dL (ref >=50)
LDL Cholesterol (Calc): 60 mg/dL (calc)
Non-HDL Cholesterol (Calc): 79 mg/dL (calc) (ref <130)
Total CHOL/HDL Ratio: 2.5 (calc) (ref <5.0)
Triglycerides: 100 mg/dL (ref <150)

## 2019-05-24 LAB — HEMOGLOBIN A1C
Hgb A1c MFr Bld: 7.9 % of total Hgb — ABNORMAL HIGH (ref <5.7)
Mean Plasma Glucose: 180 (calc)
eAG (mmol/L): 10 (calc)

## 2019-05-30 ENCOUNTER — Ambulatory Visit (INDEPENDENT_AMBULATORY_CARE_PROVIDER_SITE_OTHER): Payer: Medicare HMO | Admitting: Internal Medicine

## 2019-05-30 ENCOUNTER — Encounter: Payer: Self-pay | Admitting: Internal Medicine

## 2019-05-30 ENCOUNTER — Other Ambulatory Visit: Payer: Self-pay

## 2019-05-30 VITALS — BP 118/68 | HR 63 | Temp 98.2°F | Ht 62.0 in | Wt 134.4 lb

## 2019-05-30 DIAGNOSIS — E1159 Type 2 diabetes mellitus with other circulatory complications: Secondary | ICD-10-CM

## 2019-05-30 DIAGNOSIS — Z1159 Encounter for screening for other viral diseases: Secondary | ICD-10-CM

## 2019-05-30 DIAGNOSIS — R69 Illness, unspecified: Secondary | ICD-10-CM | POA: Diagnosis not present

## 2019-05-30 DIAGNOSIS — E782 Mixed hyperlipidemia: Secondary | ICD-10-CM | POA: Diagnosis not present

## 2019-05-30 DIAGNOSIS — I10 Essential (primary) hypertension: Secondary | ICD-10-CM | POA: Diagnosis not present

## 2019-05-30 DIAGNOSIS — M8589 Other specified disorders of bone density and structure, multiple sites: Secondary | ICD-10-CM | POA: Diagnosis not present

## 2019-05-30 DIAGNOSIS — Z1231 Encounter for screening mammogram for malignant neoplasm of breast: Secondary | ICD-10-CM | POA: Diagnosis not present

## 2019-05-30 DIAGNOSIS — E119 Type 2 diabetes mellitus without complications: Secondary | ICD-10-CM | POA: Insufficient documentation

## 2019-05-30 DIAGNOSIS — I679 Cerebrovascular disease, unspecified: Secondary | ICD-10-CM

## 2019-05-30 MED ORDER — FREESTYLE LANCETS MISC: 1 refills | Status: DC

## 2019-05-30 MED ORDER — FREESTYLE LITE TEST VI STRP: ORAL_STRIP | 1 refills | Status: DC

## 2019-05-30 NOTE — Patient Instructions (Addendum)
Please schedule your eye exam to rule out diabetic retinopathy.   I've ordered your mammogram and bone density to be done at the breast center.

## 2019-05-30 NOTE — Progress Notes (Signed)
Location:  [facility] clinic  Provider: Dr. Hollace Kinnier  Code Status: Goals of Care:  Advanced Directives 11/30/2017  Does Patient Have a Medical Advance Directive? No  Would patient like information on creating a medical advance directive? Yes (MAU/Ambulatory/Procedural Areas - Information given)     No chief complaint on file.   HPI: Patient is a 73 y.o. female seen today for medical management of chronic diseases.    She has been doing well. She is social distancing and masking when around others. Overall, she is limits going out except for groceries or appointments.   Labs reviewed with patient.   Claims her diet has improved. She is limiting her rice intake. Will eat rice at least once a day. She has reduced her portion size of rice. Denies eating sugary desserts. Does not drink soda. Admits to drinking tea with a little sugar.   Denies polyuria, polyphagia and polydipsia. Takes her Jardiance and tradjenta daily. Denies any hypoglycemic events.   Worried that her crestor may be be causing increased bowel movements. Some mornings she has 2-3 bowel movements.   She will take prilosec occasionally when her stomach is upset.   Right shoulder will hurt occasionally. When her activity is increased, she will have shoulder pain. She will rest and it will go away.    Takes her blood pressure medication daily. Tries to limit salt in her diet. Denies chest pain, dizziness or headaches.   Walks at least 2-3 times a week for about an hour. Her exercise depends on the weather. She likes to exercise outside. No recent falls or injuries.   Plans on having her eyes checked later this year.   Plans to see dentist soon.   Has muscle cramps in legs but denies any neuropathy or restless legs. She eats a banana daily to help with cramps.   Denies flu vaccine today.   Need lancet/ strip refill.  Discussed ordering bone density and mammogram.      Past Medical History:  Diagnosis Date   . Disorder of bone and cartilage, unspecified   . Dizziness and giddiness   . Hypercalcemia   . Hyperlipidemia   . Hypertension   . Hypopotassemia   . Other malaise and fatigue   . Other specified disease of white blood cells   . Special screening for malignant neoplasms, colon   . Type II or unspecified type diabetes mellitus without mention of complication, not stated as uncontrolled   . Unspecified vitamin D deficiency     Past Surgical History:  Procedure Laterality Date  . COLONOSCOPY  05/14/15   Dr. Collene Mares  . FRACTURE SURGERY  2001   Tibia & fibula  . TONSILLECTOMY  1996    Allergies  Allergen Reactions  . Metformin And Related Diarrhea    Outpatient Encounter Medications as of 05/30/2019  Medication Sig  . aspirin EC 325 MG tablet Take 1 tablet (325 mg total) by mouth daily.  . Calcium Carbonate-Vitamin D 600-400 MG-UNIT tablet Take 1 tablet by mouth daily.  . empagliflozin (JARDIANCE) 25 MG TABS tablet Take 25 mg by mouth daily.  Marland Kitchen glucose blood (FREESTYLE LITE) test strip Use as instructed to test blood sugar once daily dx E11.9  . Lancets (FREESTYLE) lancets Use as directed to test blood sugar once daily  . lisinopril-hydrochlorothiazide (ZESTORETIC) 20-12.5 MG tablet Take one tablet by mouth once daily  . omeprazole (PRILOSEC) 40 MG capsule Take 40 mg by mouth daily. Reported on 09/10/2015  . rosuvastatin (  CRESTOR) 10 MG tablet Take 1 tablet (10 mg total) by mouth daily.  . TRADJENTA 5 MG TABS tablet TAKE 1 TABLET(5 MG) BY MOUTH DAILY   No facility-administered encounter medications on file as of 05/30/2019.     Review of Systems:  Review of Systems  Constitutional: Negative for activity change, appetite change and fatigue.  HENT: Negative for dental problem, hearing loss and trouble swallowing.   Eyes: Negative for photophobia and visual disturbance.  Respiratory: Negative for cough, shortness of breath and wheezing.   Cardiovascular: Negative for chest pain  and leg swelling.  Gastrointestinal: Negative for constipation, diarrhea and nausea.  Endocrine: Negative for polydipsia, polyphagia and polyuria.  Genitourinary: Negative for dysuria, frequency and hematuria.  Musculoskeletal: Positive for myalgias. Negative for arthralgias and back pain.  Skin: Negative.   Neurological: Negative for dizziness, weakness and headaches.  Hematological: Bruises/bleeds easily.  Psychiatric/Behavioral: Negative for dysphoric mood and sleep disturbance. The patient is not nervous/anxious.   All other systems reviewed and are negative.   Health Maintenance  Topic Date Due  . Hepatitis C Screening  [birth year]  . MAMMOGRAM  10/21/2017  . OPHTHALMOLOGY EXAM  12/26/2018  . INFLUENZA VACCINE  03/05/2019  . FOOT EXAM  03/09/2019  . HEMOGLOBIN A1C  11/21/2019  . TETANUS/TDAP  12/31/2024  . COLONOSCOPY  05/13/2025  . DEXA SCAN  Completed  . PNA vac Low Risk Adult  Completed    Physical Exam: There were no vitals filed for this visit. There is no height or weight on file to calculate BMI. Physical Exam Vitals signs reviewed.  Constitutional:      Appearance: Normal appearance. She is normal weight.  HENT:     Head: Normocephalic.  Eyes:     Pupils: Pupils are equal, round, and reactive to light.  Cardiovascular:     Rate and Rhythm: Normal rate and regular rhythm.     Pulses: Normal pulses.          Dorsalis pedis pulses are 2+ on the right side and 2+ on the left side.     Heart sounds: Normal heart sounds. No murmur.  Pulmonary:     Effort: Pulmonary effort is normal. No respiratory distress.     Breath sounds: Normal breath sounds. No wheezing.  Abdominal:     General: Abdomen is flat. Bowel sounds are normal.     Palpations: Abdomen is soft.  Musculoskeletal: Normal range of motion.     Right lower leg: No edema.     Left lower leg: No edema.     Right foot: Normal range of motion.     Left foot: Normal range of motion.  Feet:     Right  foot:     Protective Sensation: 10 sites tested. 10 sites sensed.     Skin integrity: Skin integrity normal.     Toenail Condition: Right toenails are normal.     Left foot:     Protective Sensation: 10 sites tested. 10 sites sensed.     Skin integrity: Skin integrity normal.     Toenail Condition: Left toenails are normal.  Skin:    General: Skin is warm and dry.     Capillary Refill: Capillary refill takes less than 2 seconds.  Neurological:     General: No focal deficit present.     Mental Status: She is alert and oriented to person, place, and time. Mental status is at baseline.     Sensory: No sensory deficit.  Motor: No weakness.  Psychiatric:        Mood and Affect: Mood normal.        Behavior: Behavior normal.        Thought Content: Thought content normal.        Judgment: Judgment normal.     Labs reviewed: Basic Metabolic Panel: Recent Labs    01/19/19 0831  NA 140  K 3.9  CL 105  CO2 28  GLUCOSE 162*  BUN 17  CREATININE 0.76  CALCIUM 9.5   Liver Function Tests: No results for input(s): AST, ALT, ALKPHOS, BILITOT, PROT, ALBUMIN in the last 8760 hours. No results for input(s): LIPASE, AMYLASE in the last 8760 hours. No results for input(s): AMMONIA in the last 8760 hours. CBC: No results for input(s): WBC, NEUTROABS, HGB, HCT, MCV, PLT in the last 8760 hours. Lipid Panel: Recent Labs    01/19/19 0831 05/23/19 0840  CHOL 176 132  HDL 56 53  LDLCALC 92 60  TRIG 187* 100  CHOLHDL 3.1 2.5   Lab Results  Component Value Date   HGBA1C 7.9 (H) 05/23/2019    Procedures since last visit: No results found.  Assessment/Plan 1. Type 2 diabetes mellitus with other circulatory complication, without long-term current use of insulin (HCC) - stable, improving - yearly foot exam 10/10  - hemoglobin A1C improved to 7.9  - patient has limited her rice portion daily and eliminated desserts - patient educated about limiting carbs and sugars - hemoglobin  A1C- future - basic metabolic panel with GFR- future - eye exam needs to be scheduled- educated patient on importance of having eyes examined  2. Mixed hyperlipidemia - stable with medication - LDL at goal<100 - continue current medication regimen - limit fats and fried food from diet - lipid panel- future  3. Essential hypertension - bp at goal of less than 150/90 - follow a low sodium diet - continue current medication regimen  4. Cerebrovascular disease - stable, not progressed - bp and lipid panel at goal - continue current medication regimen - continue to follow a diet that limits fats and sodium from diet  5. Osteopenia of multiple sites - prolonged prilosec use contributes to risk for osteoprosis - continue walking and add weight bearing exercise to improve bone health - referral for bone density scan  6. Encounter for screening mammogram for malignant neoplasm of breast - referral for mammogram to be done  7. Encounter for hepatitis C screening test for low risk patient - hepatitis C screening- today   Labs/tests ordered:  Hepatitis C screening, hemoglobin A1C, lipid panel, basic metabolic panel with GFR Next appt:  4 month follow up

## 2019-05-31 ENCOUNTER — Ambulatory Visit (INDEPENDENT_AMBULATORY_CARE_PROVIDER_SITE_OTHER): Payer: Medicare HMO | Admitting: Family

## 2019-05-31 ENCOUNTER — Encounter: Payer: Self-pay | Admitting: Family

## 2019-05-31 DIAGNOSIS — Z0000 Encounter for general adult medical examination without abnormal findings: Secondary | ICD-10-CM

## 2019-05-31 NOTE — Progress Notes (Signed)
Subjective:   Joann Kennedy is a 73 y.o. female who presents for Medicare Annual (Subsequent) preventive examination.  Review of Systems:   Cardiac Risk Factors include: advanced age (>41men, >38 women);diabetes mellitus;hypertension;dyslipidemia     Objective:     Vitals: There were no vitals taken for this visit.  There is no height or weight on file to calculate BMI.  Advanced Directives 05/31/2019 05/30/2019 11/30/2017 07/02/2017 03/23/2017 03/04/2017 11/18/2016  Does Patient Have a Medical Advance Directive? No No No No No Yes;No No  Would patient like information on creating a medical advance directive? No - Patient declined Yes (MAU/Ambulatory/Procedural Areas - Information given) Yes (MAU/Ambulatory/Procedural Areas - Information given) No - Patient declined No - Patient declined - Yes (MAU/Ambulatory/Procedural Areas - Information given)    Tobacco Social History   Tobacco Use  Smoking Status Never Smoker  Smokeless Tobacco Never Used     Counseling given: Not Answered   Clinical Intake:  Pre-visit preparation completed: No  Pain : No/denies pain  BMI - recorded: 24.58 Nutritional Status: BMI of 19-24  Normal Nutritional Risks: None Diabetes: No  How often do you need to have someone help you when you read instructions, pamphlets, or other written materials from your doctor or pharmacy?: 1 - Never What is the last grade level you completed in school?: [place]?: No  Information entered by :: Dinah Ngetich FNP-C  Past Medical History:  Diagnosis Date  . Disorder of bone and cartilage, unspecified   . Dizziness and giddiness   . Hypercalcemia   . Hyperlipidemia   . Hypertension   . Hypopotassemia   . Other malaise and fatigue   . Other specified disease of white blood cells   . Special screening for malignant neoplasms, colon   . Type II or unspecified type diabetes mellitus without mention of complication, not stated as  uncontrolled   . Unspecified vitamin D deficiency    Past Surgical History:  Procedure Laterality Date  . COLONOSCOPY  05/14/15   Dr. Collene Mares  . FRACTURE SURGERY  2001   Tibia & fibula  . TONSILLECTOMY  1996   Family History  Problem Relation Age of Onset  . Diabetes Mother   . Heart disease Mother   . Heart disease Brother    Social History   Socioeconomic History  . Marital status: Married    Spouse name: Not on file  . Number of children: Not on file  . Years of education: Not on file  . Highest education level: Not on file  Occupational History  . Not on file  Social Needs  . Financial resource strain: Not hard at all  . Food insecurity    Worry: Never true    Inability: Never true  . Transportation needs    Medical: No    Non-medical: No  Tobacco Use  . Smoking status: Never Smoker  . Smokeless tobacco: Never Used  Substance and Sexual Activity  . Alcohol use: No  . Drug use: No  . Sexual activity: Not on file  Lifestyle  . Physical activity    Days per week: 0 days    Minutes per session: 0 min  . Stress: Only a little  Relationships  . Social connections    Talks on phone: More than three times a week    Gets together: More than three times a week    Attends religious service: Never    Active member of club or organization: No  Attends meetings of clubs or organizations: Never    Relationship status: Married  Other Topics Concern  . Not on file  Social History Narrative  . Not on file    Outpatient Encounter Medications as of 05/31/2019  Medication Sig  . aspirin EC 325 MG tablet Take 1 tablet (325 mg total) by mouth daily.  . Calcium Carbonate-Vitamin D 600-400 MG-UNIT tablet Take 1 tablet by mouth daily.  . empagliflozin (JARDIANCE) 25 MG TABS tablet Take 25 mg by mouth daily.  Marland Kitchen glucose blood (FREESTYLE LITE) test strip Use as instructed to test blood sugar once daily dx E11.9  . Lancets (FREESTYLE) lancets Use as directed to test blood  sugar once daily  . lisinopril-hydrochlorothiazide (ZESTORETIC) 20-12.5 MG tablet Take one tablet by mouth once daily  . omeprazole (PRILOSEC) 40 MG capsule Take 40 mg by mouth daily. Reported on 09/10/2015  . rosuvastatin (CRESTOR) 10 MG tablet Take 1 tablet (10 mg total) by mouth daily.  . TRADJENTA 5 MG TABS tablet TAKE 1 TABLET(5 MG) BY MOUTH DAILY   No facility-administered encounter medications on file as of 05/31/2019.     Activities of Daily Living In your present state of health, do you have any difficulty performing the following activities: 05/31/2019  Hearing? N  Vision? N  Difficulty concentrating or making decisions? N  Walking or climbing stairs? N  Dressing or bathing? N  Doing errands, shopping? N  Preparing Food and eating ? N  Using the Toilet? N  In the past six months, have you accidently leaked urine? N  Do you have problems with loss of bowel control? N  Managing your Medications? N  Managing your Finances? N  Housekeeping or managing your Housekeeping? N  Some recent data might be hidden    Patient Care Team: Gayland Curry, DO as PCP - General (Geriatric Medicine)    Assessment:   This is a routine wellness examination for [name].  Exercise Activities and Dietary recommendations Current Exercise Habits: Home exercise routine, Type of exercise: walking, Time (Minutes): 60, Frequency (Times/Week): 3, Weekly Exercise (Minutes/Week): 180, Intensity: Moderate, Exercise limited by: None identified  Goals    . Maintain life and travel     Starting today 11/18/2016 I will maintain my lifestyle here, but will try to travel back home to meditate.        Fall Risk Fall Risk  05/31/2019 05/30/2019 01/24/2019 08/30/2018 03/08/2018  Falls in the past year? 0 0 0 0 No  Number falls in past yr: 0 - 0 0 -  Injury with Fall? 0 - 0 0 -   Is the patient's home free of loose throw rugs in walkways, pet beds, electrical cords, etc?   yes      Grab bars in the bathroom? no       Handrails on the stairs?   no      Adequate lighting?   yes  Depression Screen PHQ 2/9 Scores 05/31/2019 01/24/2019 11/30/2017 07/20/2017  PHQ - 2 Score 0 0 0 0     Cognitive Function MMSE - Mini Mental State Exam 11/30/2017 11/18/2016  Orientation to time 5 5  Orientation to Place 4 5  Registration 3 3  Attention/ Calculation 5 5  Recall 1 1  Language- name 2 objects 2 2  Language- repeat 1 1  Language- follow 3 step command 3 3  Language- read & follow direction 1 1  Write a sentence 1 1  Copy design 1 0  Total score 27 27     6CIT Screen 05/31/2019  What Year? 0 points  What month? 0 points  What time? 0 points  Count back from 20 0 points  Months in reverse 0 points  Repeat phrase 0 points  Total Score 0    Immunization History  Administered Date(s) Administered  . Influenza Whole 08/10/2013  . Influenza-Unspecified 05/05/2015  . Pneumococcal Conjugate-13 04/21/2016  . Pneumococcal Polysaccharide-23 07/20/2017  . Tdap 01/01/2015    Qualifies for Shingles Vaccine? Decline   Screening Tests Health Maintenance  Topic Date Due  . Hepatitis C Screening  [birth year]  . MAMMOGRAM  10/21/2017  . OPHTHALMOLOGY EXAM  12/26/2018  . INFLUENZA VACCINE  03/05/2019  . FOOT EXAM  03/09/2019  . HEMOGLOBIN A1C  11/21/2019  . TETANUS/TDAP  12/31/2024  . COLONOSCOPY  05/13/2025  . DEXA SCAN  Completed  . PNA vac Low Risk Adult  Completed    Cancer Screenings: Lung: Low Dose CT Chest recommended if Age 72-80 years, 30 pack-year currently smoking OR have quit w/in 15years. Patient does not qualify. Breast:  Up to date on Mammogram? No   Up to date of Bone Density/Dexa? Yes Colorectal: Up to date due 05/13/2025   Additional Screenings: Hepatitis C Screening: completed      Plan:  - Mammogram referral done 05/30/2019   - Annual foot exam decline referral until after COVID-19 restrictions.  - Ophthalmology will make appointment with her eye doctor.  - Hep C  orders in place.   I have personally reviewed and noted the following in the patient's chart:   . Medical and social history . Use of alcohol, tobacco or illicit drugs  . Current medications and supplements . Functional ability and status . Nutritional status . Physical activity . Advanced directives . List of other physicians . Hospitalizations, surgeries, and ER visits in previous 12 months . Vitals . Screenings to include cognitive, depression, and falls . Referrals and appointments  In addition, I have reviewed and discussed with patient certain preventive protocols, quality metrics, and best practice recommendations. A written personalized care plan for preventive services as well as general preventive health recommendations were provided to patient.  Sandrea Hughs, NP  05/31/2019

## 2019-05-31 NOTE — Patient Instructions (Signed)
Joann Kennedy , Thank you for taking time to come for your Medicare Wellness Visit. I appreciate your ongoing commitment to your health goals. Please review the following plan we discussed and let me know if I can assist you in the future.   Screening recommendations/referrals: Colonoscopy Up to date due 05/13/2025  Mammogram : Ordered 05/30/2019  Bone Density: Ordered 05/30/2019  Recommended yearly ophthalmology/optometry visit for glaucoma screening and checkup Recommended yearly dental visit for hygiene and checkup  Vaccinations: Influenza vaccine: Declined  Pneumococcal vaccine: completed  Tdap vaccine:Due  Shingles vaccine: Declined     Advanced directives:No    Conditions/risks identified: Advance age female > 15 yrs,Hypertension,dyslipidemia and Type 2 Diabetes   Next appointment: 1 yr   Preventive Care 68 Years and Older, Female Preventive care refers to lifestyle choices and visits with your health care provider that can promote health and wellness. What does preventive care include?  A yearly physical exam. This is also called an annual well check.  Dental exams once or twice a year.  Routine eye exams. Ask your health care provider how often you should have your eyes checked.  Personal lifestyle choices, including:  Daily care of your teeth and gums.  Regular physical activity.  Eating a healthy diet.  Avoiding tobacco and drug use.  Limiting alcohol use.  Practicing safe sex.  Taking low-dose aspirin every day.  Taking vitamin and mineral supplements as recommended by your health care provider. What happens during an annual well check? The services and screenings done by your health care provider during your annual well check will depend on your age, overall health, lifestyle risk factors, and family history of disease. Counseling  Your health care provider may ask you questions about your:  Alcohol use.  Tobacco use.  Drug use.  Emotional  well-being.  Home and relationship well-being.  Sexual activity.  Eating habits.  History of falls.  Memory and ability to understand (cognition).  Work and work Statistician.  Reproductive health. Screening  You may have the following tests or measurements:  Height, weight, and BMI.  Blood pressure.  Lipid and cholesterol levels. These may be checked every 5 years, or more frequently if you are over 23 years old.  Skin check.  Lung cancer screening. You may have this screening every year starting at age 67 if you have a 30-pack-year history of smoking and currently smoke or have quit within the past 15 years.  Fecal occult blood test (FOBT) of the stool. You may have this test every year starting at age 17.  Flexible sigmoidoscopy or colonoscopy. You may have a sigmoidoscopy every 5 years or a colonoscopy every 10 years starting at age 51.  Hepatitis C blood test.  Hepatitis B blood test.  Sexually transmitted disease (STD) testing.  Diabetes screening. This is done by checking your blood sugar (glucose) after you have not eaten for a while (fasting). You may have this done every 1-3 years.  Bone density scan. This is done to screen for osteoporosis. You may have this done starting at age 21.  Mammogram. This may be done every 1-2 years. Talk to your health care provider about how often you should have regular mammograms. Talk with your health care provider about your test results, treatment options, and if necessary, the need for more tests. Vaccines  Your health care provider may recommend certain vaccines, such as:  Influenza vaccine. This is recommended every year.  Tetanus, diphtheria, and acellular pertussis (Tdap, Td) vaccine. You  may need a Td booster every 10 years.  Zoster vaccine. You may need this after age 27.  Pneumococcal 13-valent conjugate (PCV13) vaccine. One dose is recommended after age 30.  Pneumococcal polysaccharide (PPSV23) vaccine. One  dose is recommended after age 65. Talk to your health care provider about which screenings and vaccines you need and how often you need them. This information is not intended to replace advice given to you by your health care provider. Make sure you discuss any questions you have with your health care provider. Document Released: 08/17/2015 Document Revised: 04/09/2016 Document Reviewed: 05/22/2015 Elsevier Interactive Patient Education  2017 [place] Prevention in the Home Falls can cause injuries. They can happen to people of all ages. There are many things you can do to make your home safe and to help prevent falls. What can I do on the outside of my home?  Regularly fix the edges of walkways and driveways and fix any cracks.  Remove anything that might make you trip as you walk through a door, such as a raised step or threshold.  Trim any bushes or trees on the path to your home.  Use bright outdoor lighting.  Clear any walking paths of anything that might make someone trip, such as rocks or tools.  Regularly check to see if handrails are loose or broken. Make sure that both sides of any steps have handrails.  Any raised decks and porches should have guardrails on the edges.  Have any leaves, snow, or ice cleared regularly.  Use sand or salt on walking paths during winter.  Clean up any spills in your garage right away. This includes oil or grease spills. What can I do in the bathroom?  Use night lights.  Install grab bars by the toilet and in the tub and shower. Do not use towel bars as grab bars.  Use non-skid mats or decals in the tub or shower.  If you need to sit down in the shower, use a plastic, non-slip stool.  Keep the floor dry. Clean up any water that spills on the floor as soon as it happens.  Remove soap buildup in the tub or shower regularly.  Attach bath mats securely with double-sided non-slip rug tape.  Do not have throw rugs and other  things on the floor that can make you trip. What can I do in the bedroom?  Use night lights.  Make sure that you have a light by your bed that is easy to reach.  Do not use any sheets or blankets that are too big for your bed. They should not hang down onto the floor.  Have a firm chair that has side arms. You can use this for support while you get dressed.  Do not have throw rugs and other things on the floor that can make you trip. What can I do in the kitchen?  Clean up any spills right away.  Avoid walking on wet floors.  Keep items that you use a lot in easy-to-reach places.  If you need to reach something above you, use a strong step stool that has a grab bar.  Keep electrical cords out of the way.  Do not use floor polish or wax that makes floors slippery. If you must use wax, use non-skid floor wax.  Do not have throw rugs and other things on the floor that can make you trip. What can I do with my stairs?  Do not leave any items  on the stairs.  Make sure that there are handrails on both sides of the stairs and use them. Fix handrails that are broken or loose. Make sure that handrails are as long as the stairways.  Check any carpeting to make sure that it is firmly attached to the stairs. Fix any carpet that is loose or worn.  Avoid having throw rugs at the top or bottom of the stairs. If you do have throw rugs, attach them to the floor with carpet tape.  Make sure that you have a light switch at the top of the stairs and the bottom of the stairs. If you do not have them, ask someone to add them for you. What else can I do to help prevent falls?  Wear shoes that:  Do not have high heels.  Have rubber bottoms.  Are comfortable and fit you well.  Are closed at the toe. Do not wear sandals.  If you use a stepladder:  Make sure that it is fully opened. Do not climb a closed stepladder.  Make sure that both sides of the stepladder are locked into place.  Ask  someone to hold it for you, if possible.  Clearly mark and make sure that you can see:  Any grab bars or handrails.  First and last steps.  Where the edge of each step is.  Use tools that help you move around (mobility aids) if they are needed. These include:  Canes.  Walkers.  Scooters.  Crutches.  Turn on the lights when you go into a dark area. Replace any light bulbs as soon as they burn out.  Set up your furniture so you have a clear path. Avoid moving your furniture around.  If any of your floors are uneven, fix them.  If there are any pets around you, be aware of where they are.  Review your medicines with your doctor. Some medicines can make you feel dizzy. This can increase your chance of falling. Ask your doctor what other things that you can do to help prevent falls. This information is not intended to replace advice given to you by your health care provider. Make sure you discuss any questions you have with your health care provider. Document Released: 05/17/2009 Document Revised: 12/27/2015 Document Reviewed: 08/25/2014 Elsevier Interactive Patient Education  2017 [employer].

## 2019-05-31 NOTE — Progress Notes (Signed)
This service is provided via telemedicine  No vital signs collected/recorded due to the encounter was a telemedicine visit.   Location of patient (ex: home, work):  Home   Patient consents to a telephone visit:  Yes  Location of the provider (ex: office, home): Office   Name of any referring provider:  Hollace Kinnier , D. O  Names of all persons participating in the telemedicine service and their role in the encounter:  Dinah Ngetich NP, Ruthell Rummage CMA, and patient   Time spent on call:  Ruthell Rummage spent 9 minutes on phone with patient

## 2019-06-12 IMAGING — MR MR SHOULDER*R* W/O CM
4 of 5 series · 22 of 40 positions shown · non-contrast
Comparison: None.

CLINICAL DATA: Chronic right shoulder pain.

EXAM:
MRI OF THE RIGHT SHOULDER WITHOUT CONTRAST
TECHNIQUE: Multiplanar, multisequence MR imaging of the shoulder was performed.
No intravenous contrast was administered.

[Series 3: T2 fat-sat · axial · 4.0mm · 0.55mm/px · z∈[-47,+58]mm · 6 of 23 slices shown (1 of 3)]
[im 1/23]
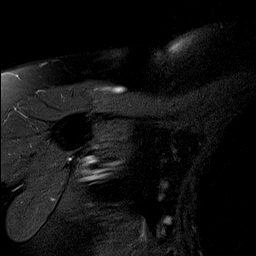
[im 5/23]
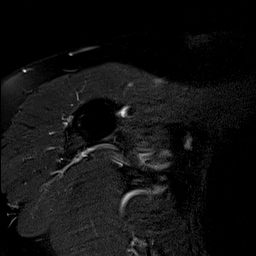
[im 9/23]
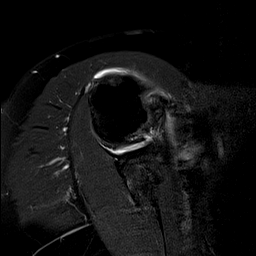
[im 14/23]
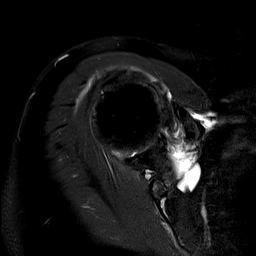
[im 18/23]
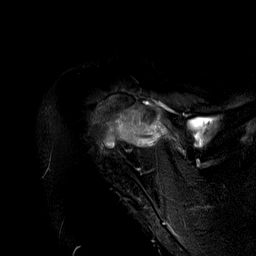
[im 23/23]
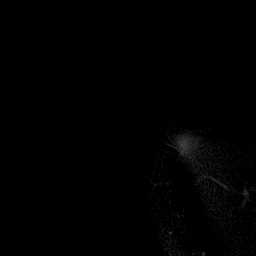

[Series 4: T2 fat-sat · coronal · 4.0mm · 0.55mm/px · 4 of 25 slices shown (2 of 3)]
[im 1/25]
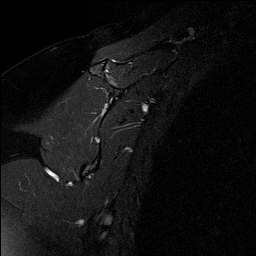
[im 4/25]
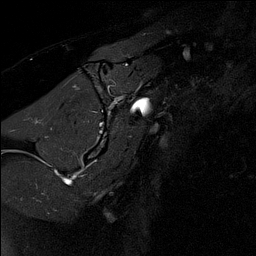
[im 14/25]
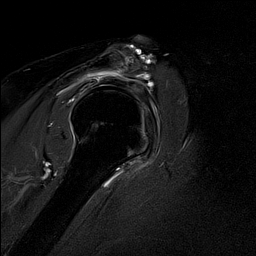
[im 21/25]
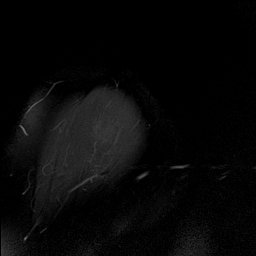

[Series 6: T2 fat-sat · sagittal · 4.0mm · 0.27mm/px · 3 of 28 slices shown (3 of 3)]
[im 4/28]
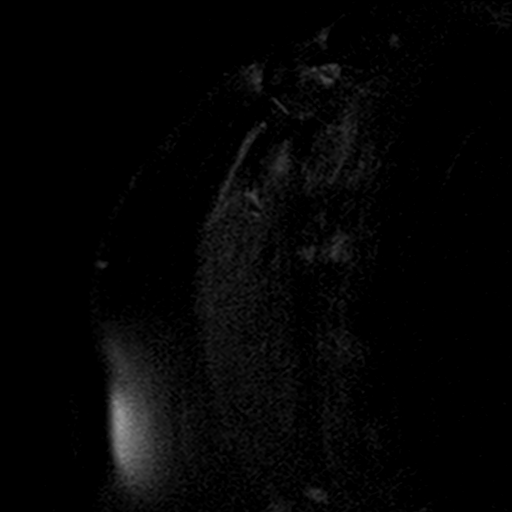
[im 14/28]
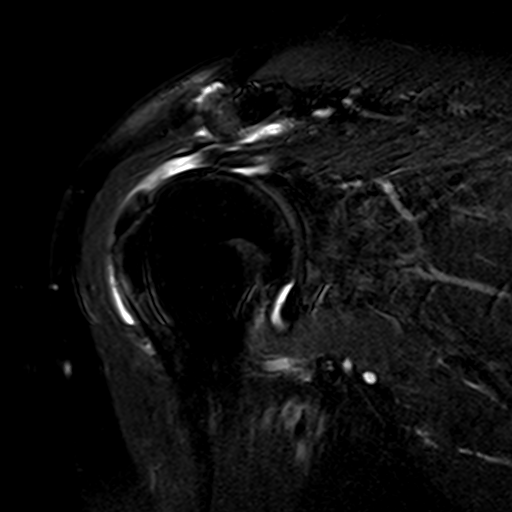
[im 24/28]
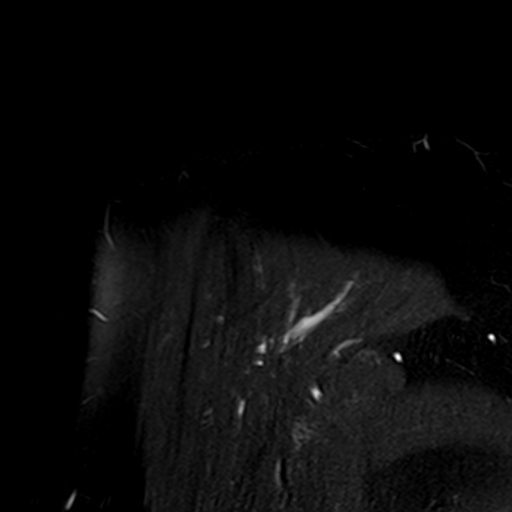

[Series 7: PD · sagittal · 4.0mm · 0.22mm/px · 9 of 28 slices shown]
[im 1/28]
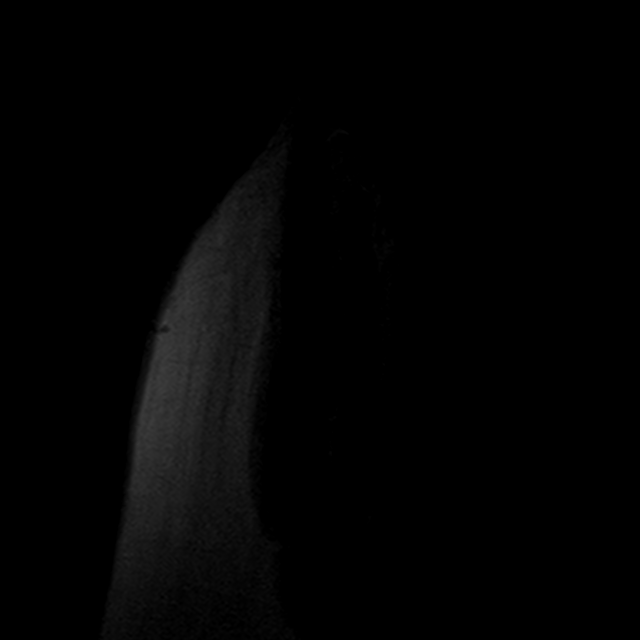
[im 4/28]
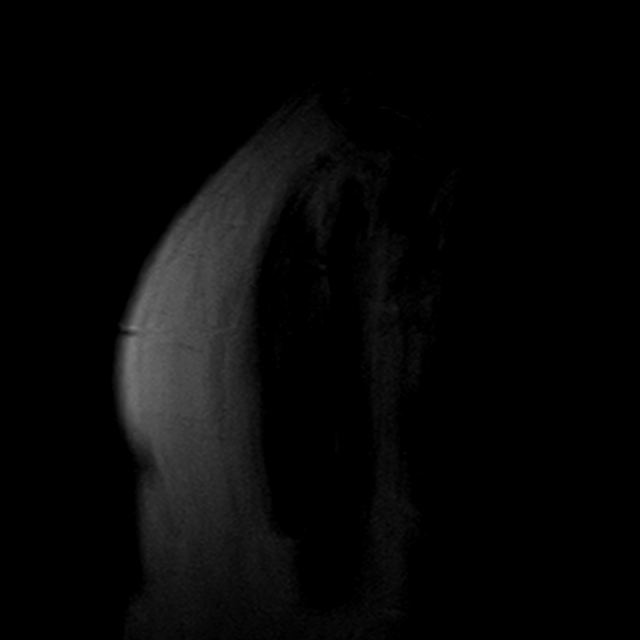
[im 7/28]
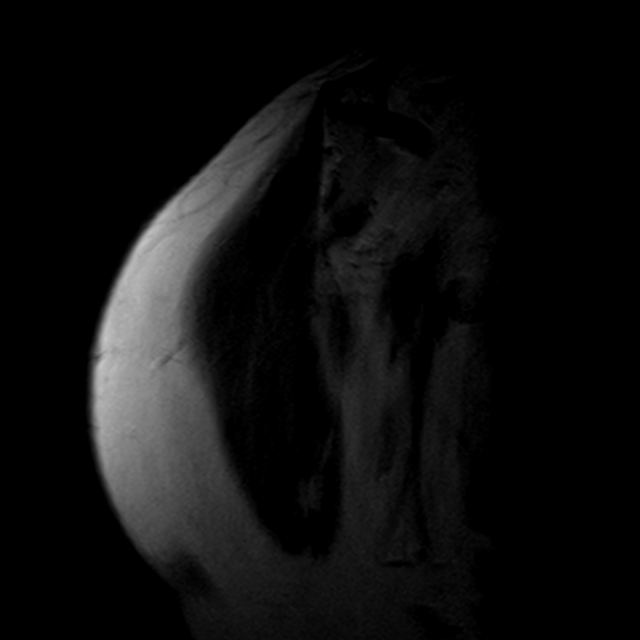
[im 11/28]
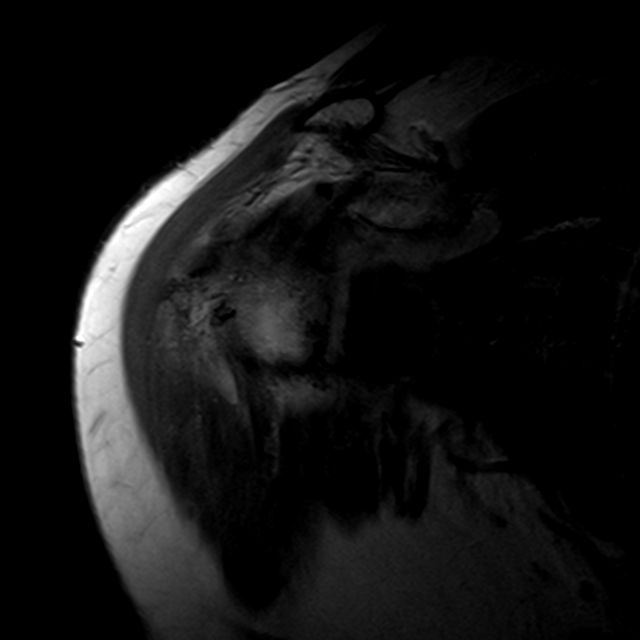
[im 14/28]
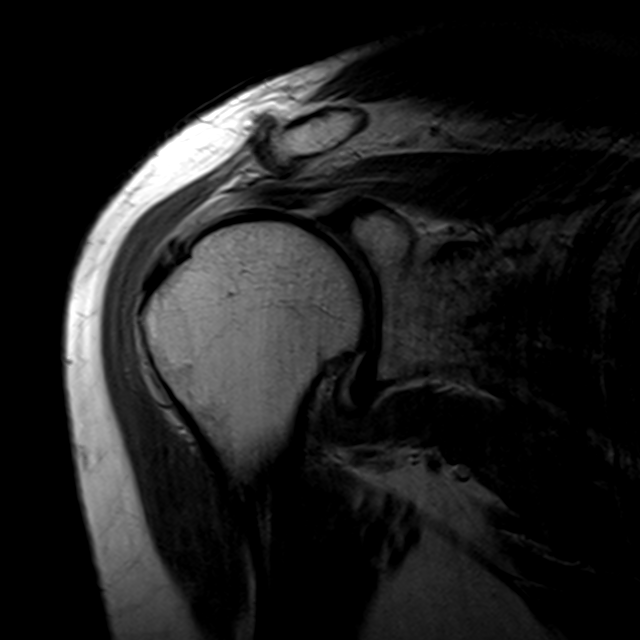
[im 17/28]
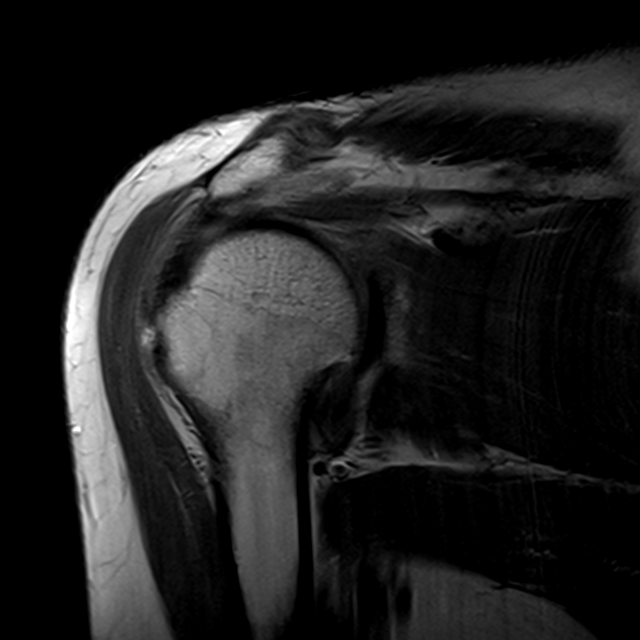
[im 21/28]
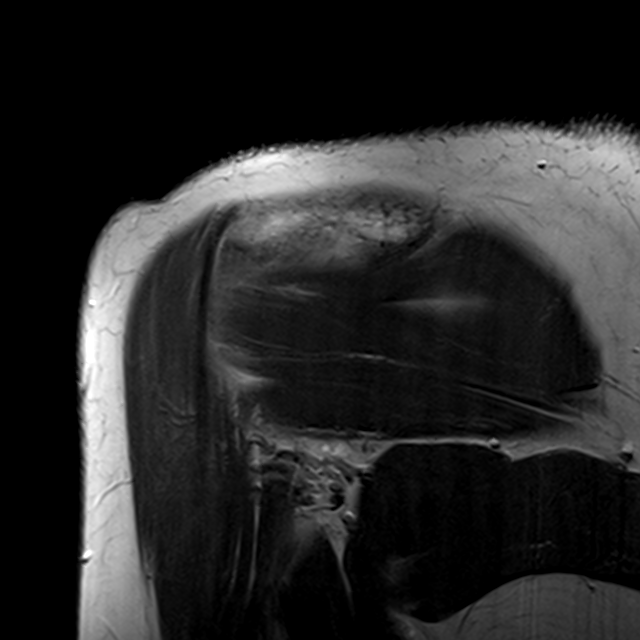
[im 24/28]
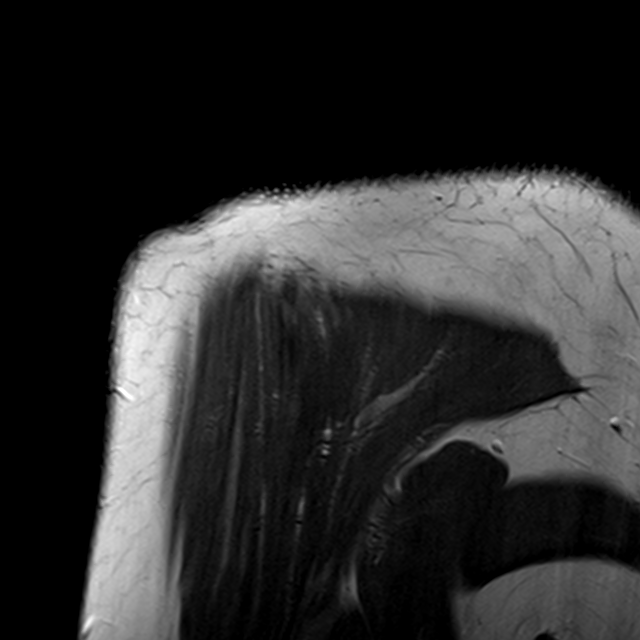
[im 28/28]
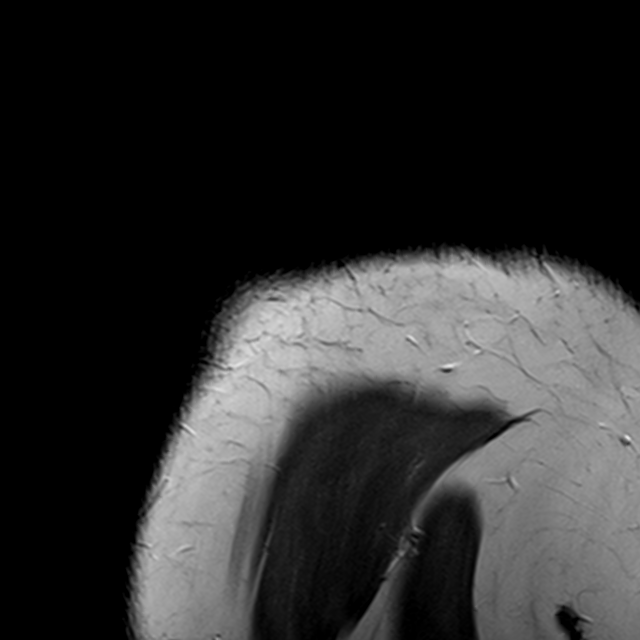

[22 of 40 positions shown; findings below may reference images not displayed]

FINDINGS: Exam is somewhat limited by patient motion but diagnostic.

Rotator cuff: Significant rotator cuff tendinopathy/ tendinosis with
interstitial tears involving the subscapularis tendon along with a
shallow articular surface tear. The infraspinatus tendon also
demonstrates interstitial tears. There is a full-thickness retracted
tear of the supraspinatus tendon with approximately 16 mm of
retraction. The tear is a maximum of 10 mm wide.

Muscles:  Normal

Biceps long head: The tendon is not identified on any of the imaging
sequences certain and is likely torn and retracted.

Acromioclavicular Joint: Mild degenerative changes Type 2 acromion.
Mild to moderate lateral downsloping with minimal undersurface
spurring.

Glenohumeral Joint: Mild/early degenerative changes with joint space
narrowing, degenerative chondrosis and early spurring. Small joint
effusion. No synovitis.

Labrum:  Degenerative changes but no obvious tear.

Bones:  No acute bony findings.

Other: Moderate subacromial/subdeltoid fluid.
IMPRESSION: 1. Significant rotator cuff tendinopathy/tendinosis.
2. Full-thickness retracted supraspinous tendon tear with 16 mm of
retraction. The tear is 10 mm wide.
3. Torn and retracted long head biceps tendon.
4. Mild glenohumeral joint degenerative changes and labral
degenerative changes without obvious tear.
5. Mild to moderate lateral downsloping of a type 2 acromion and
mild AC joint degenerative changes.

## 2019-07-27 DIAGNOSIS — Z0100 Encounter for examination of eyes and vision without abnormal findings: Secondary | ICD-10-CM | POA: Diagnosis not present

## 2019-08-26 ENCOUNTER — Ambulatory Visit: Payer: Medicare Other

## 2019-08-26 ENCOUNTER — Other Ambulatory Visit: Payer: Medicare Other

## 2019-09-12 ENCOUNTER — Ambulatory Visit: Payer: Medicare HMO | Attending: Internal Medicine

## 2019-09-12 DIAGNOSIS — H5213 Myopia, bilateral: Secondary | ICD-10-CM | POA: Diagnosis not present

## 2019-09-12 DIAGNOSIS — Z23 Encounter for immunization: Secondary | ICD-10-CM

## 2019-09-12 LAB — HM DIABETES EYE EXAM

## 2019-09-12 NOTE — Progress Notes (Signed)
   Covid-19 Vaccination Clinic  Name:  Joann Kennedy    MRN: [id number] DOB: May 08, 1946  09/12/2019  [name] was observed post Covid-19 immunization for 15 minutes without incidence. She was provided with Vaccine Information Sheet and instruction to access the V-Safe system.   [name] was instructed to call 911 with any severe reactions post vaccine: Marland Kitchen Difficulty breathing  . Swelling of your face and throat  . A fast heartbeat  . A bad rash all over your body  . Dizziness and weakness    Immunizations Administered    Name Date Dose VIS Date Route   Pfizer COVID-19 Vaccine 09/12/2019  3:08 PM 0.3 mL 07/15/2019 Intramuscular   Manufacturer: [place]   Lot: [id number]   [place]: [id number]

## 2019-09-19 ENCOUNTER — Other Ambulatory Visit: Payer: Medicare HMO

## 2019-09-20 ENCOUNTER — Other Ambulatory Visit: Payer: Self-pay

## 2019-09-20 ENCOUNTER — Other Ambulatory Visit: Payer: Medicare HMO

## 2019-09-20 DIAGNOSIS — E782 Mixed hyperlipidemia: Secondary | ICD-10-CM

## 2019-09-20 DIAGNOSIS — E1159 Type 2 diabetes mellitus with other circulatory complications: Secondary | ICD-10-CM | POA: Diagnosis not present

## 2019-09-20 DIAGNOSIS — I679 Cerebrovascular disease, unspecified: Secondary | ICD-10-CM

## 2019-09-20 DIAGNOSIS — Z1159 Encounter for screening for other viral diseases: Secondary | ICD-10-CM

## 2019-09-21 LAB — BASIC METABOLIC PANEL WITH GFR
BUN: 20 mg/dL (ref 7–25)
CO2: 26 mmol/L (ref 20–32)
Calcium: 9.4 mg/dL (ref 8.6–10.4)
Chloride: 106 mmol/L (ref 98–110)
Creat: 0.71 mg/dL (ref 0.60–0.93)
GFR, Est African American: 99 mL/min/{1.73_m2} (ref >=60)
GFR, Est Non African American: 86 mL/min/{1.73_m2} (ref >=60)
Glucose, Bld: 144 mg/dL — ABNORMAL HIGH (ref 65–99)
Potassium: 3.8 mmol/L (ref 3.5–5.3)
Sodium: 141 mmol/L (ref 135–146)

## 2019-09-21 LAB — HEMOGLOBIN A1C
Hgb A1c MFr Bld: 8.6 % of total Hgb — ABNORMAL HIGH (ref <5.7)
Mean Plasma Glucose: 200 (calc)
eAG (mmol/L): 11.1 (calc)

## 2019-09-21 LAB — LIPID PANEL
Cholesterol: 128 mg/dL (ref <200)
HDL: 53 mg/dL (ref >=50)
LDL Cholesterol (Calc): 55 mg/dL (calc)
Non-HDL Cholesterol (Calc): 75 mg/dL (calc) (ref <130)
Total CHOL/HDL Ratio: 2.4 (calc) (ref <5.0)
Triglycerides: 122 mg/dL (ref <150)

## 2019-09-21 LAB — HEPATITIS C ANTIBODY
Hepatitis C Ab: NONREACTIVE
SIGNAL TO CUT-OFF: 0.03 (ref <1.00)

## 2019-09-22 NOTE — Progress Notes (Signed)
Hepatitis C screen is negative. Bad cholesterol is at goal (55 with goal of <70) Kidneys and electrolytes are normal. Hba1c (sugar average) has gone up considerably to 8.6.  Is she still taking her jardiance and tradjenta?  Has her diet or exercise plan changed lately (I know weather has affected outside exercise)

## 2019-09-26 ENCOUNTER — Encounter: Payer: Self-pay | Admitting: Internal Medicine

## 2019-09-26 ENCOUNTER — Ambulatory Visit (INDEPENDENT_AMBULATORY_CARE_PROVIDER_SITE_OTHER): Payer: Medicare HMO | Admitting: Internal Medicine

## 2019-09-26 ENCOUNTER — Other Ambulatory Visit: Payer: Self-pay

## 2019-09-26 VITALS — BP 138/82 | HR 84 | Ht 62.0 in | Wt 132.0 lb

## 2019-09-26 DIAGNOSIS — I679 Cerebrovascular disease, unspecified: Secondary | ICD-10-CM | POA: Diagnosis not present

## 2019-09-26 DIAGNOSIS — I10 Essential (primary) hypertension: Secondary | ICD-10-CM

## 2019-09-26 DIAGNOSIS — S46001D Unspecified injury of muscle(s) and tendon(s) of the rotator cuff of right shoulder, subsequent encounter: Secondary | ICD-10-CM | POA: Diagnosis not present

## 2019-09-26 DIAGNOSIS — E782 Mixed hyperlipidemia: Secondary | ICD-10-CM | POA: Diagnosis not present

## 2019-09-26 DIAGNOSIS — M8589 Other specified disorders of bone density and structure, multiple sites: Secondary | ICD-10-CM | POA: Diagnosis not present

## 2019-09-26 DIAGNOSIS — E1159 Type 2 diabetes mellitus with other circulatory complications: Secondary | ICD-10-CM

## 2019-09-26 MED ORDER — FREESTYLE LANCETS MISC: 1 refills | Status: DC

## 2019-09-26 MED ORDER — FREESTYLE LITE TEST VI STRP: ORAL_STRIP | 1 refills | Status: DC

## 2019-09-26 NOTE — Patient Instructions (Signed)
[facility] to schedule your mammogram and bone density studies.

## 2019-09-26 NOTE — Progress Notes (Signed)
Location:  [facility] clinic  Provider: Dr. Hollace Kinnier  Goals of Care:  Advanced Directives 09/26/2019  Does Patient Have a Medical Advance Directive? No  Would patient like information on creating a medical advance directive? No - Patient declined     Chief Complaint  Patient presents with  . Medical Management of Chronic Issues    4 month follow up , with husband     HPI: Patient is a 74 y.o. female seen today for medical management of chronic diseases.    Labs reviewed with patient.   Husband present for visit.   She is still checking her sugars daily. Her readings average around 125. Requesting a refill of lancets and strips. Taking her Jardiance and tradjenta daily. No signs of hypoglycemia.   Her diet and exercise have been poor over the winter months. She is not as active due to the weather. Her diet is high in rice and carbs.Claims her diet is high in fruit and vegetables.  Drinks milk and water daily.   She recieved her first covid-19 vaccine on 09/12/2019. Will have her second in about 2 weeks.   Right shoulder pain has improved. She is not lifting heavy objects or grandchildren as much. She does not take anything for the pain.   She has not had her mammogram and bone density due to covid.She is planning on having it done this year.   Her heartburn varies. She is not taking prilosec daily, only when needed. She is avoiding trigger foods.    She has a family issue she would like to discuss with Dr. Mariea Clonts. She is tearful when mentioning issue to student.   Her husband was recently diagnosed with cancer of the Liver. She is upset about his health and the possiblility of loosing him. In her culture, she is very dependent on her husband and worries about life without him.       Past Medical History:  Diagnosis Date  . Disorder of bone and cartilage, unspecified   . Dizziness and giddiness   . Hypercalcemia   . Hyperlipidemia   . Hypertension   . Hypopotassemia   .  Other malaise and fatigue   . Other specified disease of white blood cells   . Special screening for malignant neoplasms, colon   . Type II or unspecified type diabetes mellitus without mention of complication, not stated as uncontrolled   . Unspecified vitamin D deficiency     Past Surgical History:  Procedure Laterality Date  . COLONOSCOPY  05/14/15   Dr. Collene Mares  . FRACTURE SURGERY  2001   Tibia & fibula  . TONSILLECTOMY  1996    Allergies  Allergen Reactions  . Metformin And Related Diarrhea  . Poison Ivy Extract Rash    Outpatient Encounter Medications as of 09/26/2019  Medication Sig  . aspirin EC 325 MG tablet Take 1 tablet (325 mg total) by mouth daily.  . Calcium Carbonate-Vitamin D 600-400 MG-UNIT tablet Take 1 tablet by mouth daily.  . empagliflozin (JARDIANCE) 25 MG TABS tablet Take 25 mg by mouth daily.  Marland Kitchen glucose blood (FREESTYLE LITE) test strip Use as instructed to test blood sugar once daily dx E11.9  . Lancets (FREESTYLE) lancets Use as directed to test blood sugar once daily  . lisinopril-hydrochlorothiazide (ZESTORETIC) 20-12.5 MG tablet Take one tablet by mouth once daily  . omeprazole (PRILOSEC) 40 MG capsule Take 40 mg by mouth daily. Reported on 09/10/2015  . rosuvastatin (CRESTOR) 10 MG tablet Take  1 tablet (10 mg total) by mouth daily.  . TRADJENTA 5 MG TABS tablet TAKE 1 TABLET(5 MG) BY MOUTH DAILY   No facility-administered encounter medications on file as of 09/26/2019.    Review of Systems:  Review of Systems  Constitutional: Negative for activity change, appetite change and fatigue.  HENT: Negative for dental problem, hearing loss and trouble swallowing.   Eyes: Negative for photophobia and visual disturbance.  Respiratory: Negative for cough and shortness of breath.   Cardiovascular: Negative for chest pain and leg swelling.  Gastrointestinal: Negative for abdominal pain, constipation, diarrhea and nausea.  Endocrine: Negative for polydipsia,  polyphagia and polyuria.  Genitourinary: Negative for dysuria, frequency, hematuria and vaginal bleeding.  Musculoskeletal:       Right shoulder pain  Skin: Negative.   Neurological: Negative for dizziness, weakness and headaches.  Psychiatric/Behavioral: Negative for dysphoric mood. The patient is not nervous/anxious.     Health Maintenance  Topic Date Due  . MAMMOGRAM  10/21/2017  . INFLUENZA VACCINE  11/02/2019 (Originally 03/05/2019)  . FOOT EXAM  01/24/2020 (Originally 03/09/2019)  . HEMOGLOBIN A1C  03/19/2020  . OPHTHALMOLOGY EXAM  09/11/2020  . TETANUS/TDAP  12/31/2024  . COLONOSCOPY  05/13/2025  . DEXA SCAN  Completed  . Hepatitis C Screening  Completed  . PNA vac Low Risk Adult  Completed    Physical Exam: Vitals:   09/26/19 1004  BP: 138/82  Pulse: 84  SpO2: 98%  Weight: 132 lb (59.9 kg)  Height: 5\' 2"  (1.575 m)   Body mass index is 24.14 kg/m. Physical Exam Vitals reviewed.  Constitutional:      Appearance: Normal appearance. She is normal weight.  HENT:     Head: Normocephalic.  Cardiovascular:     Rate and Rhythm: Normal rate and regular rhythm.     Pulses: Normal pulses.     Heart sounds: Normal heart sounds. No murmur.  Pulmonary:     Effort: Pulmonary effort is normal. No respiratory distress.     Breath sounds: Normal breath sounds. No wheezing.  Abdominal:     General: Abdomen is flat. Bowel sounds are normal.     Palpations: Abdomen is soft.  Musculoskeletal:        General: Normal range of motion.     Right lower leg: No edema.     Left lower leg: No edema.  Skin:    General: Skin is warm and dry.     Capillary Refill: Capillary refill takes less than 2 seconds.  Neurological:     General: No focal deficit present.     Mental Status: She is alert and oriented to person, place, and time. Mental status is at baseline.  Psychiatric:        Mood and Affect: Mood normal.        Behavior: Behavior normal.        Thought Content: Thought content  normal.        Judgment: Judgment normal.     Labs reviewed: Basic Metabolic Panel: Recent Labs    01/19/19 0831 09/20/19 0841  NA 140 141  K 3.9 3.8  CL 105 106  CO2 28 26  GLUCOSE 162* 144*  BUN 17 20  CREATININE 0.76 0.71  CALCIUM 9.5 9.4   Liver Function Tests: No results for input(s): AST, ALT, ALKPHOS, BILITOT, PROT, ALBUMIN in the last 8760 hours. No results for input(s): LIPASE, AMYLASE in the last 8760 hours. No results for input(s): AMMONIA in the last 8760 hours.  CBC: No results for input(s): WBC, NEUTROABS, HGB, HCT, MCV, PLT in the last 8760 hours. Lipid Panel: Recent Labs    01/19/19 0831 05/23/19 0840 09/20/19 0841  CHOL 176 132 128  HDL 56 53 53  LDLCALC 92 60 55  TRIG 187* 100 122  CHOLHDL 3.1 2.5 2.4   Lab Results  Component Value Date   HGBA1C 8.6 (H) 09/20/2019    Procedures since last visit: No results found.  Assessment/Plan 1. Type 2 diabetes mellitus with other circulatory complication, without long-term current use of insulin (HCC) - A1C has increased to 8.6, goal A1C<7, suspect her dietary choices as contributing factor - recommend diet low in carbohydrates - will recheck A1C in 3 months, if still increased may d/c tradjenta and consider injectable DLP1 inhibitor or long acting insulin - hemoglobin A1C- future  2. Mixed hyperlipidemia - lipid panel at goal, LDL<70 - continue current statin regimen - lipid panel- future  3. Essential hypertension - bp at goal <150/90 - continue lisinopril-HCTZ for bp and kidney protection - basic metabolic panel with GFR- future - continue low sodium diet  4. Cerebrovascular disease - stable, not progressed - bp and lipid panel at goal - continue asa and statin regimen  5. Osteopenia of multiple sites - she has reduced her prilosec to PRN, prolonged use increases her risk for osteoporosis - referral for bone density scan - continue weight bearing exercise  6. Injury of right rotator  cuff, subsequent encounter - stable at this time - limit heavy lifting to prevent reinjury   Labs/tests ordered: basic metabolic panel with GFR, hemoglobin A1C, lipid panel- future Next appt:  4 month follow up

## 2019-09-27 ENCOUNTER — Other Ambulatory Visit: Payer: Self-pay | Admitting: *Deleted

## 2019-09-27 DIAGNOSIS — R69 Illness, unspecified: Secondary | ICD-10-CM | POA: Diagnosis not present

## 2019-09-27 MED ORDER — GLUCOSE BLOOD VI STRP: ORAL_STRIP | 1 refills | Status: DC

## 2019-09-27 MED ORDER — ONETOUCH ULTRA 2 W/DEVICE KIT: PACK | 0 refills | Status: DC

## 2019-09-27 MED ORDER — ONETOUCH DELICA LANCETS 30G MISC: 1.0000 | Freq: Every day | 1 refills | Status: DC

## 2019-09-27 NOTE — Telephone Encounter (Signed)
[facility] sent fax stating that insurance will not cover Freestyle but will One Touch.  Rx sent

## 2019-10-01 ENCOUNTER — Ambulatory Visit: Payer: Medicare Other

## 2019-10-07 ENCOUNTER — Ambulatory Visit: Payer: Medicare HMO | Attending: Internal Medicine

## 2019-10-07 DIAGNOSIS — Z23 Encounter for immunization: Secondary | ICD-10-CM | POA: Insufficient documentation

## 2019-10-07 NOTE — Progress Notes (Signed)
   Covid-19 Vaccination Clinic  Name:  Joann Kennedy    MRN: [id number] DOB: [birth year]  10/07/2019  Joann Kennedy was observed post Covid-19 immunization for 15 minutes without incident. She was provided with Vaccine Information Sheet and instruction to access the V-Safe system.   Joann Kennedy was instructed to call 911 with any severe reactions post vaccine: Marland Kitchen Difficulty breathing  . Swelling of face and throat  . A fast heartbeat  . A bad rash all over body  . Dizziness and weakness   Immunizations Administered    Name Date Dose VIS Date Route   Pfizer COVID-19 Vaccine 10/07/2019  6:48 PM 0.3 mL 07/15/2019 Intramuscular   Manufacturer: [place]   Lot: [id number]   [place]: [id number]

## 2019-10-17 DIAGNOSIS — H5203 Hypermetropia, bilateral: Secondary | ICD-10-CM | POA: Diagnosis not present

## 2019-10-17 DIAGNOSIS — H524 Presbyopia: Secondary | ICD-10-CM | POA: Diagnosis not present

## 2019-10-17 DIAGNOSIS — H52209 Unspecified astigmatism, unspecified eye: Secondary | ICD-10-CM | POA: Diagnosis not present

## 2019-11-16 ENCOUNTER — Other Ambulatory Visit: Payer: Self-pay | Admitting: Internal Medicine

## 2019-11-16 DIAGNOSIS — I10 Essential (primary) hypertension: Secondary | ICD-10-CM

## 2019-11-17 NOTE — Telephone Encounter (Signed)
rx sent to pharmacy by e-script  

## 2019-11-28 ENCOUNTER — Other Ambulatory Visit: Payer: Self-pay | Admitting: Internal Medicine

## 2019-11-28 DIAGNOSIS — E119 Type 2 diabetes mellitus without complications: Secondary | ICD-10-CM

## 2019-11-28 NOTE — Telephone Encounter (Signed)
rx sent to pharmacy by e-script  

## 2019-12-02 DIAGNOSIS — R69 Illness, unspecified: Secondary | ICD-10-CM | POA: Diagnosis not present

## 2019-12-06 ENCOUNTER — Other Ambulatory Visit: Payer: Self-pay

## 2019-12-06 MED ORDER — GLUCOSE BLOOD VI STRP: ORAL_STRIP | 1 refills | Status: DC

## 2019-12-07 DIAGNOSIS — R69 Illness, unspecified: Secondary | ICD-10-CM | POA: Diagnosis not present

## 2019-12-12 ENCOUNTER — Other Ambulatory Visit: Payer: Self-pay | Admitting: Internal Medicine

## 2019-12-12 DIAGNOSIS — E119 Type 2 diabetes mellitus without complications: Secondary | ICD-10-CM

## 2019-12-13 NOTE — Telephone Encounter (Signed)
rx sent to pharmacy by e-script  

## 2020-01-20 ENCOUNTER — Other Ambulatory Visit: Payer: Medicare HMO

## 2020-01-23 ENCOUNTER — Ambulatory Visit: Payer: Medicare HMO | Admitting: Internal Medicine

## 2020-01-26 ENCOUNTER — Other Ambulatory Visit: Payer: Medicare HMO

## 2020-02-01 ENCOUNTER — Other Ambulatory Visit: Payer: Self-pay

## 2020-02-01 ENCOUNTER — Other Ambulatory Visit: Payer: Medicare HMO

## 2020-02-01 DIAGNOSIS — E1159 Type 2 diabetes mellitus with other circulatory complications: Secondary | ICD-10-CM | POA: Diagnosis not present

## 2020-02-01 DIAGNOSIS — E782 Mixed hyperlipidemia: Secondary | ICD-10-CM | POA: Diagnosis not present

## 2020-02-02 ENCOUNTER — Other Ambulatory Visit: Payer: Medicare HMO

## 2020-02-02 LAB — HEMOGLOBIN A1C
Hgb A1c MFr Bld: 8.1 % of total Hgb — ABNORMAL HIGH (ref <5.7)
Mean Plasma Glucose: 186 (calc)
eAG (mmol/L): 10.3 (calc)

## 2020-02-02 LAB — BASIC METABOLIC PANEL WITH GFR
BUN: 22 mg/dL (ref 7–25)
CO2: 26 mmol/L (ref 20–32)
Calcium: 9.8 mg/dL (ref 8.6–10.4)
Chloride: 106 mmol/L (ref 98–110)
Creat: 0.72 mg/dL (ref 0.60–0.93)
GFR, Est African American: 97 mL/min/{1.73_m2} (ref >=60)
GFR, Est Non African American: 84 mL/min/{1.73_m2} (ref >=60)
Glucose, Bld: 144 mg/dL — ABNORMAL HIGH (ref 65–99)
Potassium: 4.3 mmol/L (ref 3.5–5.3)
Sodium: 140 mmol/L (ref 135–146)

## 2020-02-02 LAB — LIPID PANEL
Cholesterol: 149 mg/dL (ref <200)
HDL: 56 mg/dL (ref >=50)
LDL Cholesterol (Calc): 71 mg/dL (calc)
Non-HDL Cholesterol (Calc): 93 mg/dL (calc) (ref <130)
Total CHOL/HDL Ratio: 2.7 (calc) (ref <5.0)
Triglycerides: 139 mg/dL (ref <150)

## 2020-02-02 NOTE — Progress Notes (Signed)
Bad cholesterol and triglycerides have trended up since last time. Sugar average has come down to 8.1 from 8.6 which is still higher than I'd like her to be but improved.  We'll determine a plan for this at her appt when we catch up on what else has been happening. Electrolytes and kidney are doing fine.

## 2020-02-09 ENCOUNTER — Encounter: Payer: Self-pay | Admitting: Internal Medicine

## 2020-02-09 ENCOUNTER — Other Ambulatory Visit: Payer: Self-pay

## 2020-02-09 ENCOUNTER — Ambulatory Visit (INDEPENDENT_AMBULATORY_CARE_PROVIDER_SITE_OTHER): Payer: Medicare HMO | Admitting: Internal Medicine

## 2020-02-09 VITALS — BP 122/78 | HR 65 | Temp 96.8°F | Ht 62.0 in | Wt 129.4 lb

## 2020-02-09 DIAGNOSIS — M25512 Pain in left shoulder: Secondary | ICD-10-CM | POA: Diagnosis not present

## 2020-02-09 DIAGNOSIS — M79661 Pain in right lower leg: Secondary | ICD-10-CM

## 2020-02-09 DIAGNOSIS — E782 Mixed hyperlipidemia: Secondary | ICD-10-CM

## 2020-02-09 DIAGNOSIS — G8929 Other chronic pain: Secondary | ICD-10-CM

## 2020-02-09 DIAGNOSIS — S46001S Unspecified injury of muscle(s) and tendon(s) of the rotator cuff of right shoulder, sequela: Secondary | ICD-10-CM

## 2020-02-09 DIAGNOSIS — E1159 Type 2 diabetes mellitus with other circulatory complications: Secondary | ICD-10-CM | POA: Diagnosis not present

## 2020-02-09 DIAGNOSIS — I10 Essential (primary) hypertension: Secondary | ICD-10-CM

## 2020-02-09 NOTE — Progress Notes (Signed)
Location:  [facility] clinic Provider:  Chrishun Scheer L. Mariea Clonts, D.O., C.M.D.  Goals of Care:  Advanced Directives 09/26/2019  Does Patient Have a Medical Advance Directive? No  Would patient like information on creating a medical advance directive? No - Patient declined   Chief Complaint  Patient presents with  . Medical Management of Chronic Issues    4 month follow-up   . Arm Pain    Ongoing left arm pain   . Immunizations    Per patient covid-19 vaccines up to date, not sure of dates   . Advanced Directive    Discuss different types of Advance Directive     HPI: Patient is a 74 y.o. female seen today for medical management of chronic diseases.    She's having ongoing left arm pain in the arm where she had the covid vaccines--now three months ago.  It was sore after the second vaccine.  She cannot reach around to close her bra.    Her husband was found to have a neuroendocrine tx with sandostatin--early stage.    Foot exam done.  Diabetic foot exam was performed with the following findings:   No deformities, ulcerations, or other skin breakdown Normal sensation of 10g monofilament Intact posterior tibialis and dorsalis pedis pulses Some difficulty describing which toe, but seemed to be more related to language than absent sensation    She is having pain at times in her lower leg where she had her surgery after an accident.  Xray afterward showed it was not right.  It took longer to heal.  She has a steel rod.    Past Medical History:  Diagnosis Date  . Disorder of bone and cartilage, unspecified   . Dizziness and giddiness   . Hypercalcemia   . Hyperlipidemia   . Hypertension   . Hypopotassemia   . Other malaise and fatigue   . Other specified disease of white blood cells   . Special screening for malignant neoplasms, colon   . Type II or unspecified type diabetes mellitus without mention of complication, not stated as uncontrolled   . Unspecified vitamin D deficiency      Past Surgical History:  Procedure Laterality Date  . COLONOSCOPY  05/14/15   Dr. Collene Mares  . FRACTURE SURGERY  2001   Tibia & fibula  . TONSILLECTOMY  1996    Allergies  Allergen Reactions  . Metformin And Related Diarrhea  . Poison Ivy Extract Rash    Outpatient Encounter Medications as of 02/09/2020  Medication Sig  . aspirin EC 325 MG tablet Take 1 tablet (325 mg total) by mouth daily.  . Blood Glucose Monitoring Suppl (ONE TOUCH ULTRA 2) w/Device KIT Use to test blood sugar once daily. Dx: E11.9  . Calcium Carbonate-Vitamin D 600-400 MG-UNIT tablet Take 1 tablet by mouth daily.  Marland Kitchen glucose blood test strip OneTouch Test Strips. Use to test blood sugar once daily. Dx: E11.9  . JARDIANCE 25 MG TABS tablet TAKE 1 TABLET BY MOUTH DAILY  . lisinopril-hydrochlorothiazide (ZESTORETIC) 20-12.5 MG tablet TAKE 1 TABLET BY MOUTH EVERY DAY  . omeprazole (PRILOSEC) 40 MG capsule Take 40 mg by mouth daily. Reported on 09/10/2015  . OneTouch Delica Lancets 33H MISC 1 each by Does not apply route daily. Use to test blood sugar once daily. Dx: E11.9  . rosuvastatin (CRESTOR) 10 MG tablet Take 1 tablet (10 mg total) by mouth daily.  . TRADJENTA 5 MG TABS tablet TAKE 1 TABLET(5 MG) BY MOUTH DAILY  No facility-administered encounter medications on file as of 02/09/2020.    Review of Systems:  Review of Systems  Constitutional: Negative for chills, fever and malaise/fatigue.  HENT: Negative for congestion and hearing loss.   Eyes: Negative for blurred vision.  Respiratory: Negative for cough and shortness of breath.   Cardiovascular: Negative for chest pain, palpitations and leg swelling.  Gastrointestinal: Negative for abdominal pain.  Genitourinary: Negative for dysuria.  Musculoskeletal: Positive for joint pain. Negative for falls.  Neurological: Negative for dizziness and loss of consciousness.  Endo/Heme/Allergies: Does not bruise/bleed easily.  Psychiatric/Behavioral: Negative for  depression and memory loss. The patient is not nervous/anxious and does not have insomnia.     Health Maintenance  Topic Date Due  . MAMMOGRAM  10/21/2017  . FOOT EXAM  03/09/2019  . INFLUENZA VACCINE  03/04/2020  . HEMOGLOBIN A1C  08/02/2020  . OPHTHALMOLOGY EXAM  09/11/2020  . TETANUS/TDAP  12/31/2024  . COLONOSCOPY  05/13/2025  . DEXA SCAN  Completed  . COVID-19 Vaccine  Completed  . Hepatitis C Screening  Completed  . PNA vac Low Risk Adult  Completed    Physical Exam: Vitals:   02/09/20 0754  BP: 122/78  Pulse: 65  Temp: (!) 96.8 F (36 C)  TempSrc: Temporal  SpO2: 97%  Weight: 129 lb 6.4 oz (58.7 kg)  Height: 5' 2"  (1.575 m)   Body mass index is 23.67 kg/m. Physical Exam Vitals reviewed.  Constitutional:      Appearance: Normal appearance.  HENT:     Head: Normocephalic and atraumatic.  Cardiovascular:     Rate and Rhythm: Normal rate and regular rhythm.     Pulses: Normal pulses.     Heart sounds: Normal heart sounds.  Pulmonary:     Effort: Pulmonary effort is normal.     Breath sounds: Normal breath sounds. No wheezing or rales.  Abdominal:     General: Bowel sounds are normal.     Palpations: Abdomen is soft.  Musculoskeletal:     Right lower leg: No edema.     Left lower leg: No edema.     Comments: Limited ROM both shoulders; no obvious deformity of right leg  Skin:    General: Skin is warm and dry.  Neurological:     General: No focal deficit present.     Mental Status: She is alert and oriented to person, place, and time.  Psychiatric:        Mood and Affect: Mood normal.        Behavior: Behavior normal.     Labs reviewed: Basic Metabolic Panel: Recent Labs    09/20/19 0841 02/01/20 0820  NA 141 140  K 3.8 4.3  CL 106 106  CO2 26 26  GLUCOSE 144* 144*  BUN 20 22  CREATININE 0.71 0.72  CALCIUM 9.4 9.8   Liver Function Tests: No results for input(s): AST, ALT, ALKPHOS, BILITOT, PROT, ALBUMIN in the last 8760 hours. No  results for input(s): LIPASE, AMYLASE in the last 8760 hours. No results for input(s): AMMONIA in the last 8760 hours. CBC: No results for input(s): WBC, NEUTROABS, HGB, HCT, MCV, PLT in the last 8760 hours. Lipid Panel: Recent Labs    05/23/19 0840 09/20/19 0841 02/01/20 0820  CHOL 132 128 149  HDL 53 53 56  LDLCALC 60 55 71  TRIG 100 122 139  CHOLHDL 2.5 2.4 2.7   Lab Results  Component Value Date   HGBA1C 8.1 (H) 02/01/2020  Assessment/Plan 1. Chronic left shoulder pain - suspect another rotator cuff injury, but r/o bony path first - DG Shoulder Left; Future  2. Injury of right rotator cuff, sequela -suspect now overusing left -cont topicals, tylenol and avoid heavy lifting  3. Type 2 diabetes mellitus with other circulatory complication, without long-term current use of insulin (HCC) -control is fair, encouraged continued walking and improved diet that they are eating due to her husband's liver mass - CBC with Differential/Platelet; Future - COMPLETE METABOLIC PANEL WITH GFR; Future - Hemoglobin A1c; Future  4. Mixed hyperlipidemia -cont crestor, f/u lab - Lipid panel; Future  5. Pain in right lower leg -obtain imaging--she is concerned something is wrong with the hardware in place from her prior fx when she was young due to some pain in the area; also having pain up in her hip (/leg length issue) - DG Tibia/Fibula Right; Future  6. Essential hypertension -bp is controlled on current regimen which does include ace - COMPLETE METABOLIC PANEL WITH GFR; Future  Labs/tests ordered:   Lab Orders     CBC with Differential/Platelet     COMPLETE METABOLIC PANEL WITH GFR     Hemoglobin A1c     Lipid panel  Next appt:  06/12/2020   Kaleya Douse L. Bekka Qian, D.O. [facility] [address], [place] [address] Cell Phone (Mon-Fri 8am-5pm):  [phone] On Call:  [phone] & follow prompts after 5pm & weekends Office  Phone:  [phone] Office Fax:  [phone]

## 2020-02-10 ENCOUNTER — Ambulatory Visit
Admission: RE | Admit: 2020-02-10 | Discharge: 2020-02-10 | Disposition: A | Discharge status: Home / Self Care | Payer: Medicare HMO | Source: Ambulatory Visit | Attending: Internal Medicine | Admitting: Internal Medicine

## 2020-02-10 DIAGNOSIS — G8929 Other chronic pain: Secondary | ICD-10-CM | POA: Diagnosis not present

## 2020-02-10 DIAGNOSIS — M79661 Pain in right lower leg: Secondary | ICD-10-CM

## 2020-02-10 DIAGNOSIS — M25512 Pain in left shoulder: Secondary | ICD-10-CM

## 2020-02-10 DIAGNOSIS — M19012 Primary osteoarthritis, left shoulder: Secondary | ICD-10-CM | POA: Diagnosis not present

## 2020-02-10 DIAGNOSIS — M79604 Pain in right leg: Secondary | ICD-10-CM | POA: Diagnosis not present

## 2020-02-13 NOTE — Progress Notes (Signed)
Xrays show only arthritis of her acromioclavicular joint in the shoulder.  As suspected, her pain is likely rotator cuff involvement which cannot be assessed on plain xray.

## 2020-04-03 ENCOUNTER — Other Ambulatory Visit: Payer: Self-pay | Admitting: Internal Medicine

## 2020-04-03 DIAGNOSIS — E782 Mixed hyperlipidemia: Secondary | ICD-10-CM

## 2020-04-30 ENCOUNTER — Other Ambulatory Visit: Payer: Self-pay

## 2020-04-30 ENCOUNTER — Ambulatory Visit (INDEPENDENT_AMBULATORY_CARE_PROVIDER_SITE_OTHER): Payer: Medicare HMO | Admitting: Internal Medicine

## 2020-04-30 ENCOUNTER — Encounter: Payer: Self-pay | Admitting: Internal Medicine

## 2020-04-30 VITALS — BP 122/82 | HR 70 | Temp 97.3°F | Ht 62.0 in | Wt 131.0 lb

## 2020-04-30 DIAGNOSIS — R69 Illness, unspecified: Secondary | ICD-10-CM | POA: Diagnosis not present

## 2020-04-30 DIAGNOSIS — S46811S Strain of other muscles, fascia and tendons at shoulder and upper arm level, right arm, sequela: Secondary | ICD-10-CM

## 2020-04-30 DIAGNOSIS — M25512 Pain in left shoulder: Secondary | ICD-10-CM

## 2020-04-30 DIAGNOSIS — G8929 Other chronic pain: Secondary | ICD-10-CM

## 2020-04-30 DIAGNOSIS — M19012 Primary osteoarthritis, left shoulder: Secondary | ICD-10-CM | POA: Diagnosis not present

## 2020-04-30 DIAGNOSIS — M4802 Spinal stenosis, cervical region: Secondary | ICD-10-CM | POA: Diagnosis not present

## 2020-04-30 MED ORDER — GABAPENTIN 100 MG PO CAPS: 100.0000 mg | ORAL_CAPSULE | Freq: Every day | ORAL | 3 refills | Status: DC

## 2020-04-30 MED ORDER — ONETOUCH DELICA LANCETS 30G MISC: 1.0000 | Freq: Every day | 1 refills | Status: DC

## 2020-04-30 NOTE — Patient Instructions (Addendum)
Please call back with the name of the physical therapy location near your daughter's home.    Ok to take tylenol 500mg  up to three times per day.   Let's try gabapentin 100mg  at bedtime for neuropathic pain.   Let me know if that's not working well for you after a week or two.

## 2020-04-30 NOTE — Progress Notes (Signed)
 Location:  [facility] clinic Provider: Tiffany L. Reed, D.O., C.M.D.  Goals of Care:  Advanced Directives 09/26/2019  Does Patient Have a Medical Advance Directive? No  Would patient like information on creating a medical advance directive? No - Patient declined     Chief Complaint  Patient presents with  . Acute Visit    pain in both shoulders / would like a referral for PT  . Health Maintenance    Influenza (declined), mammogram    HPI: Patient is a 74 y.o. female seen today for an acute visit for bilateral shoulder pain.  Had come and gone until 2 weeks ago.  Has to wear front opening bra.  Has to pull up one arm with the other.  Right is worse.  She is interested in PT.  They are in [place] most days except weekends and mondays.  Now the right arm pain starts in the neck and goes all the way to her fingers.  Thumb not usually affected.  Right side discomfort is there ALL of the time now.  She had trouble sleeping despite taking tylenol.  Wonders about a steroid.    Left shoulder xrays shoulder degenerative changes.  Cannot internally rotate left arm.  She wants to defer her flu shot for today due to already having pain in her shoulders.  Past Medical History:  Diagnosis Date  . Disorder of bone and cartilage, unspecified   . Dizziness and giddiness   . Hypercalcemia   . Hyperlipidemia   . Hypertension   . Hypopotassemia   . Other malaise and fatigue   . Other specified disease of white blood cells   . Special screening for malignant neoplasms, colon   . Type II or unspecified type diabetes mellitus without mention of complication, not stated as uncontrolled   . Unspecified vitamin D deficiency     Past Surgical History:  Procedure Laterality Date  . COLONOSCOPY  05/14/15   Dr. Mann  . FRACTURE SURGERY  2001   Tibia & fibula  . TONSILLECTOMY  1996    Allergies  Allergen Reactions  . Metformin And Related Diarrhea  . Poison Ivy Extract Rash    Outpatient Encounter  Medications as of 04/30/2020  Medication Sig  . aspirin EC 325 MG tablet Take 1 tablet (325 mg total) by mouth daily.  . Blood Glucose Monitoring Suppl (ONE TOUCH ULTRA 2) w/Device KIT Use to test blood sugar once daily. Dx: E11.9  . Calcium Carbonate-Vitamin D 600-400 MG-UNIT tablet Take 1 tablet by mouth daily.  . glucose blood test strip OneTouch Test Strips. Use to test blood sugar once daily. Dx: E11.9  . JARDIANCE 25 MG TABS tablet TAKE 1 TABLET BY MOUTH DAILY  . lisinopril-hydrochlorothiazide (ZESTORETIC) 20-12.5 MG tablet TAKE 1 TABLET BY MOUTH EVERY DAY  . omeprazole (PRILOSEC) 40 MG capsule Take 40 mg by mouth daily. Reported on 09/10/2015  . OneTouch Delica Lancets 30G MISC 1 each by Does not apply route daily. Use to test blood sugar once daily. Dx: E11.9  . rosuvastatin (CRESTOR) 10 MG tablet TAKE 1 TABLET(10 MG) BY MOUTH DAILY  . TRADJENTA 5 MG TABS tablet TAKE 1 TABLET(5 MG) BY MOUTH DAILY  . [DISCONTINUED] OneTouch Delica Lancets 30G MISC 1 each by Does not apply route daily. Use to test blood sugar once daily. Dx: E11.9   No facility-administered encounter medications on file as of 04/30/2020.    Review of Systems:  Review of Systems  Constitutional: Negative for chills and   fever.  HENT: Negative for congestion and sore throat.   Respiratory: Negative for cough and shortness of breath.   Cardiovascular: Negative for chest pain and palpitations.  Gastrointestinal: Negative for abdominal pain.  Genitourinary: Negative for dysuria.  Musculoskeletal: Positive for joint pain. Negative for falls.  Neurological: Negative for dizziness and loss of consciousness.    Health Maintenance  Topic Date Due  . MAMMOGRAM  10/21/2017  . INFLUENZA VACCINE  03/04/2020  . HEMOGLOBIN A1C  08/02/2020  . OPHTHALMOLOGY EXAM  09/11/2020  . FOOT EXAM  02/08/2021  . TETANUS/TDAP  12/31/2024  . COLONOSCOPY  05/13/2025  . DEXA SCAN  Completed  . COVID-19 Vaccine  Completed  . Hepatitis C  Screening  Completed  . PNA vac Low Risk Adult  Completed    Physical Exam: Vitals:   04/30/20 0910  BP: 122/82  Pulse: 70  Temp: (!) 97.3 F (36.3 C)  TempSrc: Temporal  SpO2: 98%  Weight: 131 lb (59.4 kg)  Height: 5' 2" (1.575 m)   Body mass index is 23.96 kg/m. Physical Exam Vitals reviewed.  Constitutional:      Appearance: Normal appearance.  Cardiovascular:     Rate and Rhythm: Normal rate and regular rhythm.  Pulmonary:     Effort: Pulmonary effort is normal.  Musculoskeletal:     Right lower leg: No edema.     Left lower leg: No edema.     Comments: Left shoulder cannot internally rotate but other motions intact, tender over biceps region; right loss of ROM--cannot abduct above 90, tender from neck down to hand  Skin:    General: Skin is warm and dry.  Neurological:     Mental Status: She is alert.     Labs reviewed: Basic Metabolic Panel: Recent Labs    09/20/19 0841 02/01/20 0820  NA 141 140  K 3.8 4.3  CL 106 106  CO2 26 26  GLUCOSE 144* 144*  BUN 20 22  CREATININE 0.71 0.72  CALCIUM 9.4 9.8   Liver Function Tests: No results for input(s): AST, ALT, ALKPHOS, BILITOT, PROT, ALBUMIN in the last 8760 hours. No results for input(s): LIPASE, AMYLASE in the last 8760 hours. No results for input(s): AMMONIA in the last 8760 hours. CBC: No results for input(s): WBC, NEUTROABS, HGB, HCT, MCV, PLT in the last 8760 hours. Lipid Panel: Recent Labs    05/23/19 0840 09/20/19 0841 02/01/20 0820  CHOL 132 128 149  HDL 53 53 56  LDLCALC 60 55 71  TRIG 100 122 139  CHOLHDL 2.5 2.4 2.7   Lab Results  Component Value Date   HGBA1C 8.1 (H) 02/01/2020    Procedures since last visit: Previous xray left shoulder MRI c spine MRI right shoulder  All in epic  Assessment/Plan 1. Chronic left shoulder pain - more arthritic on this side, ? Role of neck here also - gabapentin (NEURONTIN) 100 MG capsule; Take 1 capsule (100 mg total) by mouth at bedtime.   Dispense: 30 capsule; Refill: 3 -educated on tylenol use up to 3 times per day for this pain  2. Primary osteoarthritis of left shoulder - cont tylenol, add gabapentin - gabapentin (NEURONTIN) 100 MG capsule; Take 1 capsule (100 mg total) by mouth at bedtime.  Dispense: 30 capsule; Refill: 3  3. Traumatic tear of supraspinatus tendon of right shoulder, sequela - a few years ago, refer to PT for this and her neck -also add gabapentin for pain -did not do steroid injection due to  her uncontrolled diabetes, but it not getting relief with therapy and gabapentin and tylenol, will do steroid injection on right here - gabapentin (NEURONTIN) 100 MG capsule; Take 1 capsule (100 mg total) by mouth at bedtime.  Dispense: 30 capsule; Refill: 3  4. Degenerative cervical spinal stenosis - per MRI 2018 - gabapentin (NEURONTIN) 100 MG capsule; Take 1 capsule (100 mg total) by mouth at bedtime.  Dispense: 30 capsule; Refill: 3  Labs/tests ordered:  Refer to PT Next appt:  06/11/2020  Tiffany L. Reed, D.O. Geriatrics [facility] [address] [place], [place] [address] Cell Phone (Mon-Fri 8am-5pm):  [phone] On Call:  [phone] & follow prompts after 5pm & weekends Office Phone:  [phone] Office Fax:  [phone]   

## 2020-05-02 DIAGNOSIS — S43422A Sprain of left rotator cuff capsule, initial encounter: Secondary | ICD-10-CM | POA: Diagnosis not present

## 2020-05-02 DIAGNOSIS — S43421A Sprain of right rotator cuff capsule, initial encounter: Secondary | ICD-10-CM | POA: Diagnosis not present

## 2020-05-02 DIAGNOSIS — M542 Cervicalgia: Secondary | ICD-10-CM | POA: Diagnosis not present

## 2020-05-10 DIAGNOSIS — S43422A Sprain of left rotator cuff capsule, initial encounter: Secondary | ICD-10-CM | POA: Diagnosis not present

## 2020-05-10 DIAGNOSIS — S43421A Sprain of right rotator cuff capsule, initial encounter: Secondary | ICD-10-CM | POA: Diagnosis not present

## 2020-05-10 DIAGNOSIS — M542 Cervicalgia: Secondary | ICD-10-CM | POA: Diagnosis not present

## 2020-05-24 DIAGNOSIS — M542 Cervicalgia: Secondary | ICD-10-CM | POA: Diagnosis not present

## 2020-05-24 DIAGNOSIS — S43421A Sprain of right rotator cuff capsule, initial encounter: Secondary | ICD-10-CM | POA: Diagnosis not present

## 2020-05-24 DIAGNOSIS — S43422A Sprain of left rotator cuff capsule, initial encounter: Secondary | ICD-10-CM | POA: Diagnosis not present

## 2020-05-31 DIAGNOSIS — S43422A Sprain of left rotator cuff capsule, initial encounter: Secondary | ICD-10-CM | POA: Diagnosis not present

## 2020-05-31 DIAGNOSIS — M542 Cervicalgia: Secondary | ICD-10-CM | POA: Diagnosis not present

## 2020-05-31 DIAGNOSIS — S43421A Sprain of right rotator cuff capsule, initial encounter: Secondary | ICD-10-CM | POA: Diagnosis not present

## 2020-06-01 ENCOUNTER — Other Ambulatory Visit: Payer: Self-pay | Admitting: Internal Medicine

## 2020-06-01 DIAGNOSIS — E119 Type 2 diabetes mellitus without complications: Secondary | ICD-10-CM

## 2020-06-01 DIAGNOSIS — I10 Essential (primary) hypertension: Secondary | ICD-10-CM

## 2020-06-04 ENCOUNTER — Encounter: Payer: Medicare HMO | Admitting: Family

## 2020-06-07 DIAGNOSIS — S43421A Sprain of right rotator cuff capsule, initial encounter: Secondary | ICD-10-CM | POA: Diagnosis not present

## 2020-06-07 DIAGNOSIS — S43422A Sprain of left rotator cuff capsule, initial encounter: Secondary | ICD-10-CM | POA: Diagnosis not present

## 2020-06-07 DIAGNOSIS — M542 Cervicalgia: Secondary | ICD-10-CM | POA: Diagnosis not present

## 2020-06-11 ENCOUNTER — Other Ambulatory Visit: Payer: Medicare HMO

## 2020-06-11 ENCOUNTER — Other Ambulatory Visit: Payer: Self-pay

## 2020-06-11 DIAGNOSIS — I10 Essential (primary) hypertension: Secondary | ICD-10-CM | POA: Diagnosis not present

## 2020-06-11 DIAGNOSIS — E1159 Type 2 diabetes mellitus with other circulatory complications: Secondary | ICD-10-CM

## 2020-06-11 DIAGNOSIS — E782 Mixed hyperlipidemia: Secondary | ICD-10-CM | POA: Diagnosis not present

## 2020-06-12 ENCOUNTER — Telehealth: Payer: Self-pay

## 2020-06-12 ENCOUNTER — Ambulatory Visit (INDEPENDENT_AMBULATORY_CARE_PROVIDER_SITE_OTHER): Payer: Medicare HMO | Admitting: Family

## 2020-06-12 ENCOUNTER — Encounter: Payer: Self-pay | Admitting: Family

## 2020-06-12 DIAGNOSIS — Z0000 Encounter for general adult medical examination without abnormal findings: Secondary | ICD-10-CM

## 2020-06-12 LAB — CBC WITH DIFFERENTIAL/PLATELET
Absolute Monocytes: 492 cells/uL (ref 200–950)
Basophils Absolute: 49 cells/uL (ref 0–200)
Basophils Relative: 0.6 %
Eosinophils Absolute: 180 cells/uL (ref 15–500)
Eosinophils Relative: 2.2 %
HCT: 36.4 % (ref 35.0–45.0)
Hemoglobin: 12.1 g/dL (ref 11.7–15.5)
Lymphs Abs: 3378 cells/uL (ref 850–3900)
MCH: 29.6 pg (ref 27.0–33.0)
MCHC: 33.2 g/dL (ref 32.0–36.0)
MCV: 89 fL (ref 80.0–100.0)
MPV: 8.8 fL (ref 7.5–12.5)
Monocytes Relative: 6 %
Neutro Abs: 4100 cells/uL (ref 1500–7800)
Neutrophils Relative %: 50 %
Platelets: 390 10*3/uL (ref 140–400)
RBC: 4.09 10*6/uL (ref 3.80–5.10)
RDW: 12.8 % (ref 11.0–15.0)
Total Lymphocyte: 41.2 %
WBC: 8.2 10*3/uL (ref 3.8–10.8)

## 2020-06-12 LAB — COMPLETE METABOLIC PANEL WITH GFR
AG Ratio: 1.5 (calc) (ref 1.0–2.5)
ALT: 20 U/L (ref 6–29)
AST: 15 U/L (ref 10–35)
Albumin: 4.3 g/dL (ref 3.6–5.1)
Alkaline phosphatase (APISO): 75 U/L (ref 37–153)
BUN: 19 mg/dL (ref 7–25)
CO2: 27 mmol/L (ref 20–32)
Calcium: 9.7 mg/dL (ref 8.6–10.4)
Chloride: 105 mmol/L (ref 98–110)
Creat: 0.7 mg/dL (ref 0.60–0.93)
GFR, Est African American: 100 mL/min/{1.73_m2} (ref >=60)
GFR, Est Non African American: 87 mL/min/{1.73_m2} (ref >=60)
Globulin: 2.8 g/dL (calc) (ref 1.9–3.7)
Glucose, Bld: 160 mg/dL — ABNORMAL HIGH (ref 65–99)
Potassium: 4.3 mmol/L (ref 3.5–5.3)
Sodium: 138 mmol/L (ref 135–146)
Total Bilirubin: 0.4 mg/dL (ref 0.2–1.2)
Total Protein: 7.1 g/dL (ref 6.1–8.1)

## 2020-06-12 LAB — LIPID PANEL
Cholesterol: 131 mg/dL (ref <200)
HDL: 52 mg/dL (ref >=50)
LDL Cholesterol (Calc): 57 mg/dL (calc)
Non-HDL Cholesterol (Calc): 79 mg/dL (calc) (ref <130)
Total CHOL/HDL Ratio: 2.5 (calc) (ref <5.0)
Triglycerides: 135 mg/dL (ref <150)

## 2020-06-12 LAB — HEMOGLOBIN A1C
Hgb A1c MFr Bld: 8.6 % of total Hgb — ABNORMAL HIGH (ref <5.7)
Mean Plasma Glucose: 200 (calc)
eAG (mmol/L): 11.1 (calc)

## 2020-06-12 NOTE — Progress Notes (Signed)
Cholesterol is now at goal. Sugar average has gone up again to 8.6  we need to change something at the appt Other labs ok

## 2020-06-12 NOTE — Telephone Encounter (Signed)
Ms. Joann, Kennedy are scheduled for a virtual visit with your provider today.    Just as we do with appointments in the office, we must obtain your consent to participate.  Your consent will be active for this visit and any virtual visit you may have with one of our providers in the next 365 days.    If you have a MyChart account, I can also send a copy of this consent to you electronically.  All virtual visits are billed to your insurance company just like a traditional visit in the office.  As this is a virtual visit, video technology does not allow for your provider to perform a traditional examination.  This may limit your provider's ability to fully assess your condition.  If your provider identifies any concerns that need to be evaluated in person or the need to arrange testing such as labs, EKG, etc, we will make arrangements to do so.    Although advances in technology are sophisticated, we cannot ensure that it will always work on either your end or our end.  If the connection with a video visit is poor, we may have to switch to a telephone visit.  With either a video or telephone visit, we are not always able to ensure that we have a secure connection.   I need to obtain your verbal consent now.   Are you willing to proceed with your visit today?   Joann Kennedy has provided verbal consent on 06/12/2020 for a virtual visit (video or telephone).   Joann Kennedy, [place] 06/12/2020  9:30 AM

## 2020-06-12 NOTE — Progress Notes (Signed)
Subjective:   Joann Kennedy is a 74 y.o. female who presents for Medicare Annual (Subsequent) preventive examination.  Review of Systems     Cardiac Risk Factors include: advanced age (>64mn, >>36women);hypertension;diabetes mellitus;dyslipidemia     Objective:    Today's Vitals   06/12/20 1023  PainSc: 4    There is no height or weight on file to calculate BMI.  Advanced Directives 06/12/2020 09/26/2019 05/31/2019 05/30/2019 11/30/2017 07/02/2017 03/23/2017  Does Patient Have a Medical Advance Directive? _0  No No  Would patient like information on creating a medical advance directive? No - Patient declined No - Patient declined No - Patient declined Yes (MAU/Ambulatory/Procedural Areas - Information given) Yes (MAU/Ambulatory/Procedural Areas - Information given) No - Patient declined No - Patient declined    Current Medications (verified) Outpatient Encounter Medications as of 06/12/2020  Medication Sig   aspirin EC 325 MG tablet Take 1 tablet (325 mg total) by mouth daily.   Blood Glucose Monitoring Suppl (ONE TOUCH ULTRA 2) w/Device KIT Use to test blood sugar once daily. Dx: E11.9   Calcium Carbonate-Vitamin D 600-400 MG-UNIT tablet Take 1 tablet by mouth daily.   gabapentin (NEURONTIN) 100 MG capsule Take 1 capsule (100 mg total) by mouth at bedtime.   glucose blood test strip OneTouch Test Strips. Use to test blood sugar once daily. Dx: E11.9   JARDIANCE 25 MG TABS tablet TAKE 1 TABLET BY MOUTH DAILY   lisinopril-hydrochlorothiazide (ZESTORETIC) 20-12.5 MG tablet TAKE 1 TABLET BY MOUTH EVERY DAY   omeprazole (PRILOSEC) 40 MG capsule Take 40 mg by mouth daily. Reported on 22/0/2542  OneTouch Delica Lancets 370WMISC 1 each by Does not apply route daily. Use to test blood sugar once daily. Dx: E11.9   rosuvastatin (CRESTOR) 10 MG tablet TAKE 1 TABLET(10 MG) BY MOUTH DAILY   TRADJENTA 5 MG TABS tablet TAKE 1 TABLET(5 MG) BY MOUTH DAILY   No  facility-administered encounter medications on file as of 06/12/2020.    Allergies (verified) Metformin and related and Poison ivy extract   History: Past Medical History:  Diagnosis Date   Disorder of bone and cartilage, unspecified    Dizziness and giddiness    Hypercalcemia    Hyperlipidemia    Hypertension    Hypopotassemia    Other malaise and fatigue    Other specified disease of white blood cells    Special screening for malignant neoplasms, colon    Type II or unspecified type diabetes mellitus without mention of complication, not stated as uncontrolled    Unspecified vitamin D deficiency    Past Surgical History:  Procedure Laterality Date   COLONOSCOPY  05/14/15   Dr. MCollene Mares  FRACTURE SURGERY  2001   Tibia & fibula   TONSILLECTOMY  1996   Family History  Problem Relation Age of Onset   Diabetes Mother    Heart disease Mother    Heart disease Brother    Social History   Socioeconomic History   Marital status: Married    Spouse name: Not on file   Number of children: Not on file   Years of education: Not on file   Highest education level: Not on file  Occupational History   Not on file  Tobacco Use   Smoking status: Never Smoker   Smokeless tobacco: Never Used  Vaping Use   Vaping Use: Never used  Substance and Sexual Activity   Alcohol use: No   Drug use: No  Sexual activity: Not on file  Other Topics Concern   Not on file  Social History Narrative   Not on file   Social Determinants of Health   Financial Resource Strain:    Difficulty of Paying Living Expenses: Not on file  Food Insecurity:    Worried About [place] in the Last Year: Not on file   Ran Out of Food in the Last Year: Not on file  Transportation Needs:    Lack of Transportation (Medical): Not on file   Lack of Transportation (Non-Medical): Not on file  Physical Activity:    Days of Exercise per Week: Not on file   Minutes of  Exercise per Session: Not on file  Stress:    Feeling of Stress : Not on file  Social Connections:    Frequency of Communication with Friends and Family: Not on file   Frequency of Social Gatherings with Friends and Family: Not on file   Attends Religious Services: Not on file   Active Member of Clubs or Organizations: Not on file   Attends Archivist Meetings: Not on file   Marital Status: Not on file    Tobacco Counseling Counseling given: Not Answered   Clinical Intake:  Pre-visit preparation completed: No  Pain : 0-10 Pain Score: 4  Pain Type: Chronic pain Pain Location: Shoulder Pain Orientation: Left, Right Pain Radiating Towards: yes Pain Descriptors / Indicators: Aching Pain Onset: Other (comment) (2-3 years) Pain Frequency: Constant Pain Relieving Factors: Physical Therapy,Tylenol Gabapentin Effect of Pain on Daily Activities: yes  Pain Relieving Factors: Physical Therapy,Tylenol Gabapentin  BMI - recorded: 23.96 Nutritional Status: BMI of 19-24  Normal Nutritional Risks: None Diabetes: Yes CBG done?: No Did pt. bring in CBG monitor from home?: No (130's - 150's) Glucose Meter Downloaded?: No  How often do you need to have someone help you when you read instructions, pamphlets, or other written materials from your doctor or pharmacy?: 1 - Never What is the last grade level you completed in school?: Graduate Degree  Diabetic?yes  Interpreter Needed?: No  Information entered by :: [place] Janeen Watson FNP-C   Activities of Daily Living In your present state of health, do you have any difficulty performing the following activities: 06/12/2020  Hearing? N  Vision? N  Difficulty concentrating or making decisions? N  Walking or climbing stairs? N  Dressing or bathing? Y  Comment shoulder pain  Doing errands, shopping? N  Comment husband Restaurant manager, fast food and eating ? N  Using the Toilet? N  In the past six months, have you accidently  leaked urine? Y  Do you have problems with loss of bowel control? N  Managing your Medications? N  Managing your Finances? N  Housekeeping or managing your Housekeeping? N  Some recent data might be hidden    Patient Care Team: Gayland Curry, DO as PCP - General (Geriatric Medicine)  Indicate any recent Medical Services you may have received from other than [facility] providers in the past year (date may be approximate).     Assessment:   This is a routine wellness examination for [name].  Hearing/Vision screen  Hearing Screening   _0  _1  _2  _3  _4  _5  _6  _7  _8   Right ear:           Left ear:           Comments: No Hearing Concerns.   Vision Screening Comments: No Vision Concerns. Last Eye Exam 07/05/2019  Dietary issues  and exercise activities discussed: Current Exercise Habits: Home exercise routine, Type of exercise: stretching;walking, Time (Minutes): 40, Frequency (Times/Week): 4 (depends on the weather), Weekly Exercise (Minutes/Week): 160, Intensity: Moderate, Exercise limited by: Other - see comments (bilateral Shoulder pain)  Goals     Maintain life and travel     Starting today 11/18/2016 I will maintain my lifestyle here, but will try to travel back home to meditate.       Depression Screen PHQ 2/9 Scores 06/12/2020 02/09/2020 09/26/2019 05/31/2019 01/24/2019 11/30/2017 07/20/2017  PHQ - 2 Score 0 0 0 0 0 0 0    Fall Risk Fall Risk  06/12/2020 02/09/2020 09/26/2019 05/31/2019 05/30/2019  Falls in the past year? 0 0 0 0 0  Number falls in past yr: 0 0 0 0 -  Injury with Fall? 0 0 0 0 -    Any stairs in or around the home? Yes  If so, are there any without handrails? Yes  Home free of loose throw rugs in walkways, pet beds, electrical cords, etc? No  Adequate lighting in your home to reduce risk of falls? Yes   ASSISTIVE DEVICES UTILIZED TO PREVENT FALLS:  Life alert? No  Use of a cane, walker or w/c? No  Grab bars in the bathroom? No    Shower chair or bench in shower? No  Elevated toilet seat or a handicapped toilet? No   TIMED UP AND GO:  Was the test performed? No .  Length of time to ambulate 10 feet: N/A  sec.   Gait steady and fast without use of assistive device  Cognitive Function: MMSE - Mini Mental State Exam 11/30/2017 11/18/2016  Orientation to time 5 5  Orientation to Place 4 5  Registration 3 3  Attention/ Calculation 5 5  Recall 1 1  Language- name 2 objects 2 2  Language- repeat 1 1  Language- follow 3 step command 3 3  Language- read & follow direction 1 1  Write a sentence 1 1  Copy design 1 0  Total score 27 27     6CIT Screen 06/12/2020 05/31/2019  What Year? 0 points 0 points  What month? 0 points 0 points  What time? 0 points 0 points  Count back from 20 0 points 0 points  Months in reverse 0 points 0 points  Repeat phrase 6 points 0 points  Total Score 6 0    Immunizations Immunization History  Administered Date(s) Administered   Influenza Whole 08/10/2013   Influenza-Unspecified 05/05/2015   PFIZER SARS-COV-2 Vaccination 09/12/2019, 10/07/2019   Pneumococcal Conjugate-13 04/21/2016   Pneumococcal Polysaccharide-23 07/20/2017   Tdap 01/01/2015    TDAP status: Up to date Flu Vaccine status: Declined, Education has been provided regarding the importance of this vaccine but patient still declined. Advised may receive this vaccine at local pharmacy or Health Dept. Aware to provide a copy of the vaccination record if obtained from local pharmacy or Health Dept. Verbalized acceptance and understanding. Pneumococcal vaccine status: Up to date Covid-19 vaccine status: Completed vaccines  Qualifies for Shingles Vaccine? Yes   Zostavax completed No   Shingrix Completed?: No.    Education has been provided regarding the importance of this vaccine. Patient has been advised to call insurance company to determine out of pocket expense if they have not yet received this vaccine.  Advised may also receive vaccine at local pharmacy or Health Dept. Verbalized acceptance and understanding.  Screening Tests Health Maintenance  Topic Date Due  MAMMOGRAM  10/21/2017   INFLUENZA VACCINE  03/04/2020   OPHTHALMOLOGY EXAM  09/11/2020   HEMOGLOBIN A1C  12/09/2020   FOOT EXAM  02/08/2021   TETANUS/TDAP  12/31/2024   COLONOSCOPY  05/13/2025   DEXA SCAN  Completed   COVID-19 Vaccine  Completed   Hepatitis C Screening  Completed   PNA vac Low Risk Adult  Completed    Health Maintenance  Health Maintenance Due  Topic Date Due   MAMMOGRAM  10/21/2017   INFLUENZA VACCINE  03/04/2020    Colorectal cancer screening: Completed 05/14/2015 . Repeat every 10 years Mammogram status: Completed 10/22/2015 . Repeat every year Bone Density status: Completed 3/20/20217 . Results reflect: Bone density results: OSTEOPENIA. Repeat every 2 years.  Lung Cancer Screening: (Low Dose CT Chest recommended if Age 80-80 years, 30 pack-year currently smoking OR have quit w/in 15years.) does not qualify.   Lung Cancer Screening Referral: No   Additional Screening:  Hepatitis C Screening: does not qualify; Completed Yes   Vision Screening: Recommended annual ophthalmology exams for early detection of glaucoma and other disorders of the eye. Is the patient up to date with their annual eye exam?  Yes  Who is the provider or what is the name of the office in which the patient attends annual eye exams? Optometrist in [place]  If pt is not established with a provider, would they like to be referred to a provider to establish care? No .   Dental Screening: Recommended annual dental exams for proper oral hygiene  Community Resource Referral / Chronic Care Management: CRR required this visit?  No   CCM required this visit?  No     Plan:   - Shigrix vaccine  - Mammogram and Dexa scan would like to wait until she sees PCP  - Declined Influenza vaccine due sick with flu after  getting flu shot.   I have personally reviewed and noted the following in the patients chart:    Medical and social history  Use of alcohol, tobacco or illicit drugs   Current medications and supplements  Functional ability and status  Nutritional status  Physical activity  Advanced directives  List of other physicians  Hospitalizations, surgeries, and ER visits in previous 12 months  Vitals  Screenings to include cognitive, depression, and falls  Referrals and appointments  In addition, I have reviewed and discussed with patient certain preventive protocols, quality metrics, and best practice recommendations. A written personalized care plan for preventive services as well as general preventive health recommendations were provided to patient.    Sandrea Hughs, NP   06/12/2020   Nurse Notes: Advised to get her shigrix vaccine at her pharmacy

## 2020-06-12 NOTE — Progress Notes (Signed)
    This service is provided via telemedicine  No vital signs collected/recorded due to the encounter was a telemedicine visit.   Location of patient (ex: home, work): Home.   Patient consents to a telephone visit: Yes.  Location of the provider (ex: office, home):  [facility].  Name of any referring provider: Reed, Tiffany L, DO   Names of all persons participating in the telemedicine service and their role in the encounter: Patient, Joann Kennedy, RMA, Ngetich, Dinah, NP.    Time spent on call: 8 minutes spent on the phone with Medical Assistant.   

## 2020-06-12 NOTE — Patient Instructions (Signed)
[name] , Thank you for taking time to come for your Medicare Wellness Visit. I appreciate your ongoing commitment to your health goals. Please review the following plan we discussed and let me know if I can assist you in the future.   Screening recommendations/referrals: Colonoscopy: Up to date  Mammogram: Due  Bone Density : Due  Recommended yearly ophthalmology/optometry visit for glaucoma screening and checkup Recommended yearly dental visit for hygiene and checkup  Vaccinations: Influenza vaccine : Declined  Pneumococcal vaccine : Up to date  Tdap vaccine : Up to date  Shingles vaccine : Please get vaccine at your pharmacy    Advanced directives: No   Conditions/risks identified: Advance Age female > 60 yrs,Hypertension,Type 2 DM,Dyslipidemia  Next appointment: 1 year    Preventive Care 20 Years and Older, Female Preventive care refers to lifestyle choices and visits with your health care provider that can promote health and wellness. What does preventive care include?  A yearly physical exam. This is also called an annual well check.  Dental exams once or twice a year.  Routine eye exams. Ask your health care provider how often you should have your eyes checked.  Personal lifestyle choices, including:  Daily care of your teeth and gums.  Regular physical activity.  Eating a healthy diet.  Avoiding tobacco and drug use.  Limiting alcohol use.  Practicing safe sex.  Taking low-dose aspirin every day.  Taking vitamin and mineral supplements as recommended by your health care provider. What happens during an annual well check? The services and screenings done by your health care provider during your annual well check will depend on your age, overall health, lifestyle risk factors, and family history of disease. Counseling  Your health care provider may ask you questions about your:  Alcohol use.  Tobacco use.  Drug use.  Emotional  well-being.  Home and relationship well-being.  Sexual activity.  Eating habits.  History of falls.  Memory and ability to understand (cognition).  Work and work Statistician.  Reproductive health. Screening  You may have the following tests or measurements:  Height, weight, and BMI.  Blood pressure.  Lipid and cholesterol levels. These may be checked every 5 years, or more frequently if you are over 63 years old.  Skin check.  Lung cancer screening. You may have this screening every year starting at age 57 if you have a 30-pack-year history of smoking and currently smoke or have quit within the past 15 years.  Fecal occult blood test (FOBT) of the stool. You may have this test every year starting at age 33.  Flexible sigmoidoscopy or colonoscopy. You may have a sigmoidoscopy every 5 years or a colonoscopy every 10 years starting at age 27.  Hepatitis C blood test.  Hepatitis B blood test.  Sexually transmitted disease (STD) testing.  Diabetes screening. This is done by checking your blood sugar (glucose) after you have not eaten for a while (fasting). You may have this done every 1-3 years.  Bone density scan. This is done to screen for osteoporosis. You may have this done starting at age 5.  Mammogram. This may be done every 1-2 years. Talk to your health care provider about how often you should have regular mammograms. Talk with your health care provider about your test results, treatment options, and if necessary, the need for more tests. Vaccines  Your health care provider may recommend certain vaccines, such as:  Influenza vaccine. This is recommended every year.  Tetanus, diphtheria,  and acellular pertussis (Tdap, Td) vaccine. You may need a Td booster every 10 years.  Zoster vaccine. You may need this after age 80.  Pneumococcal 13-valent conjugate (PCV13) vaccine. One dose is recommended after age 51.  Pneumococcal polysaccharide (PPSV23) vaccine. One  dose is recommended after age 72. Talk to your health care provider about which screenings and vaccines you need and how often you need them. This information is not intended to replace advice given to you by your health care provider. Make sure you discuss any questions you have with your health care provider. Document Released: 08/17/2015 Document Revised: 04/09/2016 Document Reviewed: 05/22/2015 Elsevier Interactive Patient Education  2017 [place] Prevention in the Home Falls can cause injuries. They can happen to people of all ages. There are many things you can do to make your home safe and to help prevent falls. What can I do on the outside of my home?  Regularly fix the edges of walkways and driveways and fix any cracks.  Remove anything that might make you trip as you walk through a door, such as a raised step or threshold.  Trim any bushes or trees on the path to your home.  Use bright outdoor lighting.  Clear any walking paths of anything that might make someone trip, such as rocks or tools.  Regularly check to see if handrails are loose or broken. Make sure that both sides of any steps have handrails.  Any raised decks and porches should have guardrails on the edges.  Have any leaves, snow, or ice cleared regularly.  Use sand or salt on walking paths during winter.  Clean up any spills in your garage right away. This includes oil or grease spills. What can I do in the bathroom?  Use night lights.  Install grab bars by the toilet and in the tub and shower. Do not use towel bars as grab bars.  Use non-skid mats or decals in the tub or shower.  If you need to sit down in the shower, use a plastic, non-slip stool.  Keep the floor dry. Clean up any water that spills on the floor as soon as it happens.  Remove soap buildup in the tub or shower regularly.  Attach bath mats securely with double-sided non-slip rug tape.  Do not have throw rugs and other  things on the floor that can make you trip. What can I do in the bedroom?  Use night lights.  Make sure that you have a light by your bed that is easy to reach.  Do not use any sheets or blankets that are too big for your bed. They should not hang down onto the floor.  Have a firm chair that has side arms. You can use this for support while you get dressed.  Do not have throw rugs and other things on the floor that can make you trip. What can I do in the kitchen?  Clean up any spills right away.  Avoid walking on wet floors.  Keep items that you use a lot in easy-to-reach places.  If you need to reach something above you, use a strong step stool that has a grab bar.  Keep electrical cords out of the way.  Do not use floor polish or wax that makes floors slippery. If you must use wax, use non-skid floor wax.  Do not have throw rugs and other things on the floor that can make you trip. What can I do with my  stairs?  Do not leave any items on the stairs.  Make sure that there are handrails on both sides of the stairs and use them. Fix handrails that are broken or loose. Make sure that handrails are as long as the stairways.  Check any carpeting to make sure that it is firmly attached to the stairs. Fix any carpet that is loose or worn.  Avoid having throw rugs at the top or bottom of the stairs. If you do have throw rugs, attach them to the floor with carpet tape.  Make sure that you have a light switch at the top of the stairs and the bottom of the stairs. If you do not have them, ask someone to add them for you. What else can I do to help prevent falls?  Wear shoes that:  Do not have high heels.  Have rubber bottoms.  Are comfortable and fit you well.  Are closed at the toe. Do not wear sandals.  If you use a stepladder:  Make sure that it is fully opened. Do not climb a closed stepladder.  Make sure that both sides of the stepladder are locked into place.  Ask  someone to hold it for you, if possible.  Clearly mark and make sure that you can see:  Any grab bars or handrails.  First and last steps.  Where the edge of each step is.  Use tools that help you move around (mobility aids) if they are needed. These include:  Canes.  Walkers.  Scooters.  Crutches.  Turn on the lights when you go into a dark area. Replace any light bulbs as soon as they burn out.  Set up your furniture so you have a clear path. Avoid moving your furniture around.  If any of your floors are uneven, fix them.  If there are any pets around you, be aware of where they are.  Review your medicines with your doctor. Some medicines can make you feel dizzy. This can increase your chance of falling. Ask your doctor what other things that you can do to help prevent falls. This information is not intended to replace advice given to you by your health care provider. Make sure you discuss any questions you have with your health care provider. Document Released: 05/17/2009 Document Revised: 12/27/2015 Document Reviewed: 08/25/2014 Elsevier Interactive Patient Education  2017 [employer].

## 2020-06-14 DIAGNOSIS — S43422A Sprain of left rotator cuff capsule, initial encounter: Secondary | ICD-10-CM | POA: Diagnosis not present

## 2020-06-14 DIAGNOSIS — M542 Cervicalgia: Secondary | ICD-10-CM | POA: Diagnosis not present

## 2020-06-14 DIAGNOSIS — S43421A Sprain of right rotator cuff capsule, initial encounter: Secondary | ICD-10-CM | POA: Diagnosis not present

## 2020-06-18 ENCOUNTER — Ambulatory Visit (INDEPENDENT_AMBULATORY_CARE_PROVIDER_SITE_OTHER): Payer: Medicare HMO | Admitting: Internal Medicine

## 2020-06-18 ENCOUNTER — Encounter: Payer: Self-pay | Admitting: Internal Medicine

## 2020-06-18 ENCOUNTER — Other Ambulatory Visit: Payer: Self-pay

## 2020-06-18 VITALS — BP 130/82 | HR 71 | Temp 97.5°F | Ht 62.0 in | Wt 134.0 lb

## 2020-06-18 DIAGNOSIS — S46811S Strain of other muscles, fascia and tendons at shoulder and upper arm level, right arm, sequela: Secondary | ICD-10-CM | POA: Diagnosis not present

## 2020-06-18 DIAGNOSIS — G8929 Other chronic pain: Secondary | ICD-10-CM | POA: Diagnosis not present

## 2020-06-18 DIAGNOSIS — E782 Mixed hyperlipidemia: Secondary | ICD-10-CM

## 2020-06-18 DIAGNOSIS — M25512 Pain in left shoulder: Secondary | ICD-10-CM | POA: Diagnosis not present

## 2020-06-18 DIAGNOSIS — I10 Essential (primary) hypertension: Secondary | ICD-10-CM | POA: Diagnosis not present

## 2020-06-18 DIAGNOSIS — I679 Cerebrovascular disease, unspecified: Secondary | ICD-10-CM | POA: Diagnosis not present

## 2020-06-18 DIAGNOSIS — E1159 Type 2 diabetes mellitus with other circulatory complications: Secondary | ICD-10-CM

## 2020-06-18 NOTE — Progress Notes (Signed)
Location:  [facility] clinic Provider:  Vito Beg L. Mariea Clonts, D.O., C.M.D.  Goals of Care:  Advanced Directives 06/12/2020  Does Patient Have a Medical Advance Directive? No  Would patient like information on creating a medical advance directive? No - Patient declined     No chief complaint on file.   HPI: Patient is a 74 y.o. female seen today for medical management of chronic diseases.    Reviewed labs with her.  Sugar has gone up.  Admits she eats rice a lot.  She feels hungry.  She does walk a lot.  Had been walking 2 miles slowly.  Also might have cookie, milk.  Knows she has to stop that.  Says she is 74 yo.  Sometime having rice tid.  She did stop doing that at breakfast past few years.  Discussed portion sizes--says sometimes she eats like a pig.  Her husband finishes and she is still eating.  He says she just eats more slowly.  Discussing increasing more veggies and smaller portions of rice b/c having 2 cups of rice at a helping sometimes  Also talked about more smaller portions.    Shoulders are less painful after therapy and with gabapentin.  Declines flu vaccine.  Recommended covid booster.  She will get it. Also will get mammogram done b/w.  Past Medical History:  Diagnosis Date  . Disorder of bone and cartilage, unspecified   . Dizziness and giddiness   . Hypercalcemia   . Hyperlipidemia   . Hypertension   . Hypopotassemia   . Other malaise and fatigue   . Other specified disease of white blood cells   . Special screening for malignant neoplasms, colon   . Type II or unspecified type diabetes mellitus without mention of complication, not stated as uncontrolled   . Unspecified vitamin D deficiency     Past Surgical History:  Procedure Laterality Date  . COLONOSCOPY  05/14/15   Dr. Collene Mares  . FRACTURE SURGERY  2001   Tibia & fibula  . TONSILLECTOMY  1996    Allergies  Allergen Reactions  . Metformin And Related Diarrhea  . Poison Ivy Extract Rash    Outpatient  Encounter Medications as of 06/18/2020  Medication Sig  . aspirin EC 325 MG tablet Take 1 tablet (325 mg total) by mouth daily.  . Blood Glucose Monitoring Suppl (ONE TOUCH ULTRA 2) w/Device KIT Use to test blood sugar once daily. Dx: E11.9  . Calcium Carbonate-Vitamin D 600-400 MG-UNIT tablet Take 1 tablet by mouth daily.  Marland Kitchen gabapentin (NEURONTIN) 100 MG capsule Take 1 capsule (100 mg total) by mouth at bedtime.  Marland Kitchen glucose blood test strip OneTouch Test Strips. Use to test blood sugar once daily. Dx: E11.9  . JARDIANCE 25 MG TABS tablet TAKE 1 TABLET BY MOUTH DAILY  . lisinopril-hydrochlorothiazide (ZESTORETIC) 20-12.5 MG tablet TAKE 1 TABLET BY MOUTH EVERY DAY  . omeprazole (PRILOSEC) 40 MG capsule Take 40 mg by mouth daily. Reported on 09/10/2015  . OneTouch Delica Lancets 23F MISC 1 each by Does not apply route daily. Use to test blood sugar once daily. Dx: E11.9  . rosuvastatin (CRESTOR) 10 MG tablet TAKE 1 TABLET(10 MG) BY MOUTH DAILY  . TRADJENTA 5 MG TABS tablet TAKE 1 TABLET(5 MG) BY MOUTH DAILY   No facility-administered encounter medications on file as of 06/18/2020.    Review of Systems:  Review of Systems  Constitutional: Negative for chills, fever and malaise/fatigue.  HENT: Negative for congestion, hearing loss and  sore throat.   Eyes: Negative for blurred vision.  Respiratory: Negative for cough and shortness of breath.   Cardiovascular: Negative for chest pain, palpitations and leg swelling.  Gastrointestinal: Negative for abdominal pain and diarrhea.  Genitourinary: Negative for dysuria.  Musculoskeletal: Positive for joint pain. Negative for falls.       Bilateral shoulders, but improving with therapy  Neurological: Negative for dizziness, tingling, sensory change and loss of consciousness.  Endo/Heme/Allergies:       Hyperglycemia with diabetes  Psychiatric/Behavioral: Negative for depression and memory loss. The patient is not nervous/anxious and does not have  insomnia.     Health Maintenance  Topic Date Due  . MAMMOGRAM  06/18/2020 (Originally 10/21/2017)  . INFLUENZA VACCINE  11/01/2020 (Originally 03/04/2020)  . OPHTHALMOLOGY EXAM  09/11/2020  . HEMOGLOBIN A1C  12/09/2020  . FOOT EXAM  02/08/2021  . TETANUS/TDAP  12/31/2024  . COLONOSCOPY  05/13/2025  . DEXA SCAN  Completed  . COVID-19 Vaccine  Completed  . Hepatitis C Screening  Completed  . PNA vac Low Risk Adult  Completed    Physical Exam: There were no vitals filed for this visit. There is no height or weight on file to calculate BMI. Physical Exam Vitals reviewed.  Constitutional:      Appearance: Normal appearance.  HENT:     Head: Normocephalic and atraumatic.  Eyes:     Extraocular Movements: Extraocular movements intact.     Pupils: Pupils are equal, round, and reactive to light.  Cardiovascular:     Rate and Rhythm: Normal rate and regular rhythm.     Pulses: Normal pulses.     Heart sounds: Normal heart sounds.  Pulmonary:     Effort: Pulmonary effort is normal.     Breath sounds: Normal breath sounds. No wheezing, rhonchi or rales.  Abdominal:     General: Bowel sounds are normal.  Musculoskeletal:     Comments: Limited range of motion of shoulders  Skin:    General: Skin is warm and dry.  Neurological:     General: No focal deficit present.     Mental Status: She is alert and oriented to person, place, and time.     Gait: Gait normal.  Psychiatric:        Mood and Affect: Mood normal.        Behavior: Behavior normal.     Labs reviewed: Basic Metabolic Panel: Recent Labs    09/20/19 0841 02/01/20 0820 06/11/20 0836  NA 141 140 138  K 3.8 4.3 4.3  CL 106 106 105  CO2 26 26 27   GLUCOSE 144* 144* 160*  BUN 20 22 19   CREATININE 0.71 0.72 0.70  CALCIUM 9.4 9.8 9.7   Liver Function Tests: Recent Labs    06/11/20 0836  AST 15  ALT 20  BILITOT 0.4  PROT 7.1   No results for input(s): LIPASE, AMYLASE in the last 8760 hours. No results for  input(s): AMMONIA in the last 8760 hours. CBC: Recent Labs    06/11/20 0836  WBC 8.2  NEUTROABS 4,100  HGB 12.1  HCT 36.4  MCV 89.0  PLT 390   Lipid Panel: Recent Labs    09/20/19 0841 02/01/20 0820 06/11/20 0836  CHOL 128 149 131  HDL 53 56 52  LDLCALC 55 71 57  TRIG 122 139 135  CHOLHDL 2.4 2.7 2.5   Lab Results  Component Value Date   HGBA1C 8.6 (H) 06/11/2020    Assessment/Plan 1. Type  2 diabetes mellitus with other circulatory complication, without long-term current use of insulin (HCC) - uncontrolled right now - extensive counseling provided today about diet and exercise  -next step would be a weekly injectable or daily long-acting insulin - CBC with Differential/Platelet; Future - COMPLETE METABOLIC PANEL WITH GFR; Future - Hemoglobin A1c; Future  2. Chronic left shoulder pain -cont PT and gabapentin  3. Traumatic tear of supraspinatus tendon of right shoulder, sequela -cont PT and gabapentin  4. Mixed hyperlipidemia - cont crestor therapy - Lipid panel; Future  5. Essential hypertension -bp at goal with current regimen, cont same and monitor--is on ace  6. Cerebrovascular disease -cont full dose asa due to prior CVA and diabetes  DECLINES FLU SHOT DESPITE MY EFFORTS.   Labs/tests ordered:   Lab Orders     CBC with Differential/Platelet     COMPLETE METABOLIC PANEL WITH GFR     Hemoglobin A1c     Lipid panel  Next appt:  09/24/2020  Baldo Hufnagle L. Nahun Kronberg, D.O. [facility] [address], [place] [address] Cell Phone (Mon-Fri 8am-5pm):  [phone] On Call:  [phone] & follow prompts after 5pm & weekends Office Phone:  [phone] Office Fax:  [phone]

## 2020-06-18 NOTE — Patient Instructions (Signed)
Please get your pfizer covid booster. Also go get your mammogram done.

## 2020-07-04 ENCOUNTER — Other Ambulatory Visit: Payer: Self-pay | Admitting: Internal Medicine

## 2020-07-04 DIAGNOSIS — E119 Type 2 diabetes mellitus without complications: Secondary | ICD-10-CM

## 2020-07-26 ENCOUNTER — Other Ambulatory Visit: Payer: Self-pay | Admitting: *Deleted

## 2020-07-26 DIAGNOSIS — E782 Mixed hyperlipidemia: Secondary | ICD-10-CM

## 2020-07-26 MED ORDER — ROSUVASTATIN CALCIUM 10 MG PO TABS: ORAL_TABLET | ORAL | 1 refills | Status: DC

## 2020-07-26 NOTE — Telephone Encounter (Signed)
[facility] Report for Practice Detail Medication Adherence Report.   Shows patient has not had her Crestor refilled.   I called patient and she stated that she is still taking medication daily but does need a refill.   Refill Request sent to pharmacy.

## 2020-08-07 ENCOUNTER — Other Ambulatory Visit: Payer: Self-pay | Admitting: Internal Medicine

## 2020-08-07 DIAGNOSIS — Z1231 Encounter for screening mammogram for malignant neoplasm of breast: Secondary | ICD-10-CM

## 2020-08-23 DIAGNOSIS — Z20822 Contact with and (suspected) exposure to covid-19: Secondary | ICD-10-CM | POA: Diagnosis not present

## 2020-09-10 ENCOUNTER — Other Ambulatory Visit: Payer: Self-pay

## 2020-09-10 ENCOUNTER — Other Ambulatory Visit: Payer: Medicare HMO

## 2020-09-10 DIAGNOSIS — E782 Mixed hyperlipidemia: Secondary | ICD-10-CM | POA: Diagnosis not present

## 2020-09-10 DIAGNOSIS — E1159 Type 2 diabetes mellitus with other circulatory complications: Secondary | ICD-10-CM

## 2020-09-11 LAB — COMPLETE METABOLIC PANEL WITH GFR
AG Ratio: 1.5 (calc) (ref 1.0–2.5)
ALT: 18 U/L (ref 6–29)
AST: 14 U/L (ref 10–35)
Albumin: 4.3 g/dL (ref 3.6–5.1)
Alkaline phosphatase (APISO): 79 U/L (ref 37–153)
BUN/Creatinine Ratio: 36 (calc) — ABNORMAL HIGH (ref 6–22)
BUN: 27 mg/dL — ABNORMAL HIGH (ref 7–25)
CO2: 29 mmol/L (ref 20–32)
Calcium: 9.6 mg/dL (ref 8.6–10.4)
Chloride: 105 mmol/L (ref 98–110)
Creat: 0.76 mg/dL (ref 0.60–0.93)
GFR, Est African American: 91 mL/min/{1.73_m2} (ref >=60)
GFR, Est Non African American: 78 mL/min/{1.73_m2} (ref >=60)
Globulin: 2.9 g/dL (calc) (ref 1.9–3.7)
Glucose, Bld: 129 mg/dL — ABNORMAL HIGH (ref 65–99)
Potassium: 4.2 mmol/L (ref 3.5–5.3)
Sodium: 141 mmol/L (ref 135–146)
Total Bilirubin: 0.4 mg/dL (ref 0.2–1.2)
Total Protein: 7.2 g/dL (ref 6.1–8.1)

## 2020-09-11 LAB — LIPID PANEL
Cholesterol: 128 mg/dL (ref <200)
HDL: 46 mg/dL — ABNORMAL LOW (ref >=50)
LDL Cholesterol (Calc): 61 mg/dL (calc)
Non-HDL Cholesterol (Calc): 82 mg/dL (calc) (ref <130)
Total CHOL/HDL Ratio: 2.8 (calc) (ref <5.0)
Triglycerides: 124 mg/dL (ref <150)

## 2020-09-11 LAB — CBC WITH DIFFERENTIAL/PLATELET
Absolute Monocytes: 376 cells/uL (ref 200–950)
Basophils Absolute: 40 cells/uL (ref 0–200)
Basophils Relative: 0.5 %
Eosinophils Absolute: 128 cells/uL (ref 15–500)
Eosinophils Relative: 1.6 %
HCT: 36.7 % (ref 35.0–45.0)
Hemoglobin: 12.2 g/dL (ref 11.7–15.5)
Lymphs Abs: 3232 cells/uL (ref 850–3900)
MCH: 29.4 pg (ref 27.0–33.0)
MCHC: 33.2 g/dL (ref 32.0–36.0)
MCV: 88.4 fL (ref 80.0–100.0)
MPV: 8.7 fL (ref 7.5–12.5)
Monocytes Relative: 4.7 %
Neutro Abs: 4224 cells/uL (ref 1500–7800)
Neutrophils Relative %: 52.8 %
Platelets: 443 10*3/uL — ABNORMAL HIGH (ref 140–400)
RBC: 4.15 10*6/uL (ref 3.80–5.10)
RDW: 12.6 % (ref 11.0–15.0)
Total Lymphocyte: 40.4 %
WBC: 8 10*3/uL (ref 3.8–10.8)

## 2020-09-11 LAB — HEMOGLOBIN A1C
Hgb A1c MFr Bld: 7.8 % of total Hgb — ABNORMAL HIGH (ref <5.7)
Mean Plasma Glucose: 177 mg/dL
eAG (mmol/L): 9.8 mmol/L

## 2020-09-11 NOTE — Progress Notes (Signed)
Labs look much better :)  I'm so pleased Platelets remain elevated Sugar average came down to 7.8 :)   Bad cholesterol (LDL) is at goal :)

## 2020-09-17 ENCOUNTER — Other Ambulatory Visit: Payer: Medicare HMO

## 2020-09-17 ENCOUNTER — Other Ambulatory Visit: Payer: Self-pay

## 2020-09-17 ENCOUNTER — Ambulatory Visit
Admission: RE | Admit: 2020-09-17 | Discharge: 2020-09-17 | Disposition: A | Discharge status: Home / Self Care | Payer: Medicare HMO | Source: Ambulatory Visit | Attending: Internal Medicine | Admitting: Internal Medicine

## 2020-09-17 DIAGNOSIS — Z1231 Encounter for screening mammogram for malignant neoplasm of breast: Secondary | ICD-10-CM

## 2020-09-18 ENCOUNTER — Other Ambulatory Visit: Payer: Self-pay | Admitting: Internal Medicine

## 2020-09-18 DIAGNOSIS — I10 Essential (primary) hypertension: Secondary | ICD-10-CM

## 2020-09-18 DIAGNOSIS — E1159 Type 2 diabetes mellitus with other circulatory complications: Secondary | ICD-10-CM

## 2020-09-18 DIAGNOSIS — E782 Mixed hyperlipidemia: Secondary | ICD-10-CM

## 2020-09-20 NOTE — Progress Notes (Signed)
Normal mammogram

## 2020-09-24 ENCOUNTER — Ambulatory Visit: Payer: Medicare HMO | Admitting: Internal Medicine

## 2020-09-24 ENCOUNTER — Encounter: Payer: Self-pay | Admitting: Internal Medicine

## 2020-10-08 ENCOUNTER — Ambulatory Visit (INDEPENDENT_AMBULATORY_CARE_PROVIDER_SITE_OTHER): Payer: Medicare HMO | Admitting: Internal Medicine

## 2020-10-08 ENCOUNTER — Other Ambulatory Visit: Payer: Self-pay

## 2020-10-08 ENCOUNTER — Encounter: Payer: Self-pay | Admitting: Internal Medicine

## 2020-10-08 VITALS — BP 110/80 | HR 76 | Temp 97.5°F | Ht 62.0 in | Wt 126.6 lb

## 2020-10-08 DIAGNOSIS — E782 Mixed hyperlipidemia: Secondary | ICD-10-CM

## 2020-10-08 DIAGNOSIS — S46811S Strain of other muscles, fascia and tendons at shoulder and upper arm level, right arm, sequela: Secondary | ICD-10-CM | POA: Diagnosis not present

## 2020-10-08 DIAGNOSIS — E1159 Type 2 diabetes mellitus with other circulatory complications: Secondary | ICD-10-CM

## 2020-10-08 DIAGNOSIS — I679 Cerebrovascular disease, unspecified: Secondary | ICD-10-CM

## 2020-10-08 DIAGNOSIS — M79674 Pain in right toe(s): Secondary | ICD-10-CM | POA: Diagnosis not present

## 2020-10-08 DIAGNOSIS — I10 Essential (primary) hypertension: Secondary | ICD-10-CM | POA: Diagnosis not present

## 2020-10-08 NOTE — Progress Notes (Signed)
Location:  [facility] clinic Provider:  Srah Ake L. Mariea Clonts, D.O., C.M.D.   Goals of Care:  Advanced Directives 10/08/2020  Does Patient Have a Medical Advance Directive? No  Would patient like information on creating a medical advance directive? -     Chief Complaint  Patient presents with  . Medical Management of Chronic Issues    3 month follow up. Discuss need for COVID booster and eye exam.Concerns about glucose monitoring supplies    HPI: Patient is a 75 y.o. female seen today for medical management of chronic diseases.    Has sore place on her little toe on right.  Is walking for exercise.  She has lost some weight.  Weight down 7 lbs as she tried to.  Sugar average came down to 7.8 down from 8.6.  Bad cholesterol still at goal.    Shoulder is still the same.  She went to therapy and pain is a little reduced.  Cannot do the work she used to do due to the right shoulder.  Cannot cut and chop--uses machines to do it.  Wants to work in the yard.    Got her covid booster.  They are not sure what she will do for primary care--they live in [place], but her husband comes monthly for cancer injections with Dr Alen Blew so the are still in this area.  Past Medical History:  Diagnosis Date  . Disorder of bone and cartilage, unspecified   . Dizziness and giddiness   . Hypercalcemia   . Hyperlipidemia   . Hypertension   . Hypopotassemia   . Other malaise and fatigue   . Other specified disease of white blood cells   . Special screening for malignant neoplasms, colon   . Type II or unspecified type diabetes mellitus without mention of complication, not stated as uncontrolled   . Unspecified vitamin D deficiency     Past Surgical History:  Procedure Laterality Date  . COLONOSCOPY  05/14/15   Dr. Collene Mares  . FRACTURE SURGERY  2001   Tibia & fibula  . TONSILLECTOMY  1996    Allergies  Allergen Reactions  . Metformin And Related Diarrhea  . Poison Ivy Extract Rash    Outpatient  Encounter Medications as of 10/08/2020  Medication Sig  . aspirin EC 325 MG tablet Take 1 tablet (325 mg total) by mouth daily.  . Calcium Carbonate-Vitamin D 600-400 MG-UNIT tablet Take 1 tablet by mouth daily.  Marland Kitchen gabapentin (NEURONTIN) 100 MG capsule Take 1 capsule (100 mg total) by mouth at bedtime.  Marland Kitchen JARDIANCE 25 MG TABS tablet TAKE 1 TABLET BY MOUTH DAILY  . lisinopril-hydrochlorothiazide (ZESTORETIC) 20-12.5 MG tablet TAKE 1 TABLET BY MOUTH EVERY DAY  . omeprazole (PRILOSEC) 40 MG capsule Take 40 mg by mouth daily. Reported on 09/10/2015  . rosuvastatin (CRESTOR) 10 MG tablet Take one tablet by mouth once daily.  . TRADJENTA 5 MG TABS tablet TAKE 1 TABLET(5 MG) BY MOUTH DAILY  . [DISCONTINUED] Blood Glucose Monitoring Suppl (ONE TOUCH ULTRA 2) w/Device KIT Use to test blood sugar once daily. Dx: E11.9  . [DISCONTINUED] glucose blood test strip OneTouch Test Strips. Use to test blood sugar once daily. Dx: E11.9  . [DISCONTINUED] OneTouch Delica Lancets 35K MISC 1 each by Does not apply route daily. Use to test blood sugar once daily. Dx: E11.9   No facility-administered encounter medications on file as of 10/08/2020.    Review of Systems:  Review of Systems  Constitutional: Negative for chills, fever  and malaise/fatigue.  HENT: Negative for congestion, hearing loss and sore throat.   Eyes: Negative for blurred vision.  Respiratory: Negative for cough and shortness of breath.   Cardiovascular: Negative for chest pain, palpitations and leg swelling.  Gastrointestinal: Negative for abdominal pain, blood in stool, constipation and melena.  Genitourinary: Negative for dysuria.  Musculoskeletal: Positive for joint pain. Negative for falls.       Bilateral shoulders but right worse  Skin: Negative for itching and rash.  Neurological: Negative for dizziness and loss of consciousness.  Psychiatric/Behavioral: Positive for memory loss. Negative for depression. The patient is not nervous/anxious  and does not have insomnia.        [facility]    Health Maintenance  Topic Date Due  . COVID-19 Vaccine (3 - Booster for Pfizer series) 04/08/2020  . OPHTHALMOLOGY EXAM  09/11/2020  . INFLUENZA VACCINE  11/01/2020 (Originally 03/04/2020)  . FOOT EXAM  02/08/2021  . HEMOGLOBIN A1C  03/10/2021  . MAMMOGRAM  09/17/2022  . TETANUS/TDAP  12/31/2024  . COLONOSCOPY (Pts 45-53yr Insurance coverage will need to be confirmed)  05/13/2025  . DEXA SCAN  Completed  . Hepatitis C Screening  Completed  . PNA vac Low Risk Adult  Completed  . HPV VACCINES  Aged Out    Physical Exam: Vitals:   10/08/20 1528  BP: 110/80  Pulse: 76  Temp: (!) 97.5 F (36.4 C)  SpO2: 97%  Weight: 126 lb 9.6 oz (57.4 kg)  Height: 5' 2"  (1.575 m)   Body mass index is 23.16 kg/m. Physical Exam Constitutional:      Appearance: Normal appearance.  Cardiovascular:     Rate and Rhythm: Normal rate and regular rhythm.     Pulses: Normal pulses.     Heart sounds: Normal heart sounds.  Pulmonary:     Effort: Pulmonary effort is normal.     Breath sounds: Normal breath sounds. No wheezing, rhonchi or rales.  Abdominal:     General: Bowel sounds are normal.  Musculoskeletal:     Right lower leg: No edema.     Left lower leg: No edema.     Comments: ROM limited in shoulders, pain in right 5th toe when she puts pressure against it  Neurological:     General: No focal deficit present.     Mental Status: She is alert and oriented to person, place, and time.  Psychiatric:        Mood and Affect: Mood normal.        Behavior: Behavior normal.        Thought Content: Thought content normal.        Judgment: Judgment normal.     Labs reviewed: Basic Metabolic Panel: Recent Labs    02/01/20 0820 06/11/20 0836 09/10/20 0932  NA 140 138 141  K 4.3 4.3 4.2  CL 106 105 105  CO2 26 27 29   GLUCOSE 144* 160* 129*  BUN 22 19 27*  CREATININE 0.72 0.70 0.76  CALCIUM 9.8 9.7 9.6   Liver Function Tests: Recent Labs     06/11/20 0836 09/10/20 0932  AST 15 14  ALT 20 18  BILITOT 0.4 0.4  PROT 7.1 7.2   No results for input(s): LIPASE, AMYLASE in the last 8760 hours. No results for input(s): AMMONIA in the last 8760 hours. CBC: Recent Labs    06/11/20 0836 09/10/20 0932  WBC 8.2 8.0  NEUTROABS 4,100 4,224  HGB 12.1 12.2  HCT 36.4 36.7  MCV 89.0  88.4  PLT 390 443*   Lipid Panel: Recent Labs    02/01/20 0820 06/11/20 0836 09/10/20 0932  CHOL 149 131 128  HDL 56 52 46*  LDLCALC 71 57 61  TRIG 139 135 124  CHOLHDL 2.7 2.5 2.8   Lab Results  Component Value Date   HGBA1C 7.8 (H) 09/10/2020    Procedures since last visit: MM 3D SCREEN BREAST BILATERAL  Result Date: 09/20/2020 CLINICAL DATA:  Screening. EXAM: DIGITAL SCREENING BILATERAL MAMMOGRAM WITH TOMOSYNTHESIS AND CAD TECHNIQUE: Bilateral screening digital craniocaudal and mediolateral oblique mammograms were obtained. Bilateral screening digital breast tomosynthesis was performed. The images were evaluated with computer-aided detection. COMPARISON:  Previous exam(s). ACR Breast Density Category b: There are scattered areas of fibroglandular density. FINDINGS: There are no findings suspicious for malignancy. IMPRESSION: No mammographic evidence of malignancy. A result letter of this screening mammogram will be mailed directly to the patient. RECOMMENDATION: Screening mammogram in one year. (Code:SM-B-01Y) BI-RADS CATEGORY  1: Negative. Electronically Signed   By: Claudie Revering M.D.   On: 09/20/2020 13:29    Assessment/Plan 1. Type 2 diabetes mellitus with other circulatory complication, without long-term current use of insulin (HCC) - cont current tradjenta and jardiance, walking and improved diet - Hemoglobin A1c; Future - BASIC METABOLIC PANEL WITH GFR; Future  2. Mixed hyperlipidemia -cont crestor and improved diet and exercise -has lost weight with her changes  3. Essential hypertension -bp excellent, explained this is b/c  she's taking her meds and her diet and exercise regimen are helping her  4. Cerebrovascular disease -cont bp, lipid and glucose control to prevent anymore strokes  5. Traumatic tear of supraspinatus tendon of right shoulder, sequela -continues with chronic pain, she's done therapy with some benefit, but is limited in what she can do and does not want to consider surgery  6. Pain of toe of right foot -suspect it may be neuropathic vs due to tight shoewear  Labs/tests ordered:   Lab Orders     Hemoglobin A1c     BASIC METABOLIC PANEL WITH GFR  Next appt:  4 mos med mgt   Vollie Aaron L. Shubham Thackston, D.O. [facility] [address],  [address] Cell Phone (Mon-Fri 8am-5pm):  [phone] On Call:  [phone] & follow prompts after 5pm & weekends Office Phone:  [phone] Office Fax:  [phone]

## 2020-12-03 ENCOUNTER — Other Ambulatory Visit: Payer: Self-pay | Admitting: *Deleted

## 2020-12-03 DIAGNOSIS — I10 Essential (primary) hypertension: Secondary | ICD-10-CM

## 2020-12-03 DIAGNOSIS — E119 Type 2 diabetes mellitus without complications: Secondary | ICD-10-CM

## 2020-12-03 MED ORDER — EMPAGLIFLOZIN 25 MG PO TABS: 25.0000 mg | ORAL_TABLET | Freq: Every day | ORAL | 1 refills | Status: DC

## 2020-12-03 MED ORDER — LISINOPRIL-HYDROCHLOROTHIAZIDE 20-12.5 MG PO TABS: 1.0000 | ORAL_TABLET | Freq: Every day | ORAL | 1 refills | Status: DC

## 2020-12-03 NOTE — Telephone Encounter (Signed)
Pharmacy requested refill

## 2020-12-03 NOTE — Addendum Note (Signed)
Addended by: Rafael Bihari A on: 12/03/2020 12:42 PM   Modules accepted: Orders

## 2020-12-06 DIAGNOSIS — E119 Type 2 diabetes mellitus without complications: Secondary | ICD-10-CM | POA: Diagnosis not present

## 2021-01-03 ENCOUNTER — Other Ambulatory Visit: Payer: Self-pay | Admitting: *Deleted

## 2021-01-03 DIAGNOSIS — E119 Type 2 diabetes mellitus without complications: Secondary | ICD-10-CM

## 2021-01-03 MED ORDER — LINAGLIPTIN 5 MG PO TABS: ORAL_TABLET | ORAL | 1 refills | Status: DC

## 2021-01-03 NOTE — Telephone Encounter (Signed)
Pharmacy requested refill

## 2021-01-25 ENCOUNTER — Other Ambulatory Visit: Payer: Self-pay

## 2021-01-25 DIAGNOSIS — E1159 Type 2 diabetes mellitus with other circulatory complications: Secondary | ICD-10-CM

## 2021-02-11 ENCOUNTER — Other Ambulatory Visit: Payer: Self-pay

## 2021-02-11 ENCOUNTER — Other Ambulatory Visit: Payer: Medicare HMO

## 2021-02-11 ENCOUNTER — Ambulatory Visit: Payer: Medicare HMO | Admitting: Family

## 2021-02-11 DIAGNOSIS — E1159 Type 2 diabetes mellitus with other circulatory complications: Secondary | ICD-10-CM

## 2021-02-12 LAB — HEMOGLOBIN A1C
Hgb A1c MFr Bld: 7.7 % of total Hgb — ABNORMAL HIGH (ref <5.7)
Mean Plasma Glucose: 174 mg/dL
eAG (mmol/L): 9.7 mmol/L

## 2021-02-12 LAB — BASIC METABOLIC PANEL WITH GFR
BUN: 22 mg/dL (ref 7–25)
CO2: 26 mmol/L (ref 20–32)
Calcium: 9.2 mg/dL (ref 8.6–10.4)
Chloride: 105 mmol/L (ref 98–110)
Creat: 0.69 mg/dL (ref 0.60–1.00)
Glucose, Bld: 120 mg/dL — ABNORMAL HIGH (ref 65–99)
Potassium: 3.8 mmol/L (ref 3.5–5.3)
Sodium: 139 mmol/L (ref 135–146)
eGFR: 92 mL/min/{1.73_m2} (ref >=60)

## 2021-02-14 DIAGNOSIS — H25811 Combined forms of age-related cataract, right eye: Secondary | ICD-10-CM | POA: Diagnosis not present

## 2021-02-14 DIAGNOSIS — H25813 Combined forms of age-related cataract, bilateral: Secondary | ICD-10-CM | POA: Diagnosis not present

## 2021-02-14 DIAGNOSIS — H25812 Combined forms of age-related cataract, left eye: Secondary | ICD-10-CM | POA: Diagnosis not present

## 2021-02-18 ENCOUNTER — Encounter: Payer: Self-pay | Admitting: Family

## 2021-02-18 ENCOUNTER — Other Ambulatory Visit: Payer: Self-pay

## 2021-02-18 ENCOUNTER — Ambulatory Visit (INDEPENDENT_AMBULATORY_CARE_PROVIDER_SITE_OTHER): Payer: Medicare HMO | Admitting: Family

## 2021-02-18 ENCOUNTER — Ambulatory Visit: Payer: Medicare HMO | Admitting: Family

## 2021-02-18 VITALS — BP 120/74 | HR 79 | Temp 96.8°F | Resp 16 | Ht 62.0 in | Wt 124.8 lb

## 2021-02-18 DIAGNOSIS — E782 Mixed hyperlipidemia: Secondary | ICD-10-CM | POA: Diagnosis not present

## 2021-02-18 DIAGNOSIS — G8929 Other chronic pain: Secondary | ICD-10-CM | POA: Diagnosis not present

## 2021-02-18 DIAGNOSIS — I10 Essential (primary) hypertension: Secondary | ICD-10-CM | POA: Diagnosis not present

## 2021-02-18 DIAGNOSIS — E1159 Type 2 diabetes mellitus with other circulatory complications: Secondary | ICD-10-CM

## 2021-02-18 DIAGNOSIS — M25512 Pain in left shoulder: Secondary | ICD-10-CM | POA: Diagnosis not present

## 2021-02-18 NOTE — Progress Notes (Signed)
Provider: Marlowe Sax FNP-C   Miriah Maruyama, Nelda Bucks, NP  Patient Care Team: Keari Miu, Nelda Bucks, NP as PCP - General (Family Medicine)  Extended Emergency Contact Information Primary Emergency Contact: Pianka,Liyana Abran Richard) Address: [place] [address] [place] Phone: [phone] Relation: Spouse  Code Status:  full Code  Goals of care: Advanced Directive information Advanced Directives 02/18/2021  Does Patient Have a Medical Advance Directive? No  Would patient like information on creating a medical advance directive? No - Patient declined     Chief Complaint  Patient presents with   Health Maintenance    Discuss the need for Eye exam, and Foot exam.    Medical Management of Chronic Issues    4 month follow up.    Immunizations    Discuss the need for Covid Booster, and Shingrix vaccine.     HPI:  Pt is a 75 y.o. female seen today for medical management of chronic diseases.she is here with Husband.Has a medical history of Hypertension,Hyperlipidemia,Type 2 DM,Osteoporosis among other condition.  She denies any acute issues during visit.she is here with husband who provider additional.  States doing outpatient Therapy for both  shoulder pain which has improved/.   States had Eye exam 02/15/2019 no acute issues.  Also due for Shingrix and 3 rd COVID-19 booster vaccine.    Past Medical History:  Diagnosis Date   Disorder of bone and cartilage, unspecified    Dizziness and giddiness    Hypercalcemia    Hyperlipidemia    Hypertension    Hypopotassemia    Other malaise and fatigue    Other specified disease of white blood cells    Special screening for malignant neoplasms, colon    Type II or unspecified type diabetes mellitus without mention of complication, not stated as uncontrolled    Unspecified vitamin D deficiency    Past Surgical History:  Procedure Laterality Date   COLONOSCOPY  05/14/15   Dr. Collene Mares    FRACTURE SURGERY  2001   [facility]   TONSILLECTOMY  1996    Allergies  Allergen Reactions   Metformin And Related Diarrhea   Poison Ivy Extract Rash    Allergies as of 02/18/2021       Reactions   Metformin And Related Diarrhea   Poison Ivy Extract Rash        Medication List        Accurate as of February 18, 2021  3:33 PM. If you have any questions, ask your nurse or doctor.          STOP taking these medications    gabapentin 100 MG capsule Commonly known as: NEURONTIN Stopped by: Sandrea Hughs, NP       TAKE these medications    aspirin EC 325 MG tablet Take 1 tablet (325 mg total) by mouth daily.   Calcium Carbonate-Vitamin D 600-400 MG-UNIT tablet Take 1 tablet by mouth daily.   empagliflozin 25 MG Tabs tablet Commonly known as: Jardiance Take 1 tablet (25 mg total) by mouth daily.   linagliptin 5 MG Tabs tablet Commonly known as: Tradjenta Take one tablet by mouth once daily.   lisinopril-hydrochlorothiazide 20-12.5 MG tablet Commonly known as: ZESTORETIC Take 1 tablet by mouth daily.   omeprazole 40 MG capsule Commonly known as: PRILOSEC Take 40 mg by mouth daily. Reported on 09/10/2015   rosuvastatin 10 MG tablet Commonly known as: CRESTOR Take one  tablet by mouth once daily.        Review of Systems  Constitutional:  Negative for appetite change, chills, fatigue, fever and unexpected weight change.  HENT:  Negative for congestion, dental problem, ear discharge, ear pain, facial swelling, hearing loss, nosebleeds, postnasal drip, rhinorrhea, sinus pressure, sinus pain, sneezing, sore throat, tinnitus and trouble swallowing.   Eyes:  Negative for pain, discharge, redness, itching and visual disturbance.  Respiratory:  Negative for cough, chest tightness, shortness of breath and wheezing.   Cardiovascular:  Negative for chest pain, palpitations and leg swelling.  Gastrointestinal:  Negative for abdominal distention, abdominal pain,  blood in stool, constipation, diarrhea, nausea and vomiting.  Endocrine: Negative for cold intolerance, heat intolerance, polydipsia, polyphagia and polyuria.  Genitourinary:  Negative for difficulty urinating, dysuria, flank pain, frequency and urgency.  Musculoskeletal:  Negative for arthralgias, back pain, gait problem, joint swelling, myalgias, neck pain and neck stiffness.  Skin:  Negative for color change, pallor, rash and wound.  Neurological:  Negative for dizziness, syncope, speech difficulty, weakness, light-headedness, numbness and headaches.  Hematological:  Does not bruise/bleed easily.  Psychiatric/Behavioral:  Negative for agitation, behavioral problems, confusion, hallucinations, self-injury, sleep disturbance and suicidal ideas. The patient is not nervous/anxious.    Immunization History  Administered Date(s) Administered   Influenza Whole 08/10/2013   Influenza-Unspecified 05/05/2015   PFIZER(Purple Top)SARS-COV-2 Vaccination 09/12/2019, 10/07/2019   Pneumococcal Conjugate-13 04/21/2016   Pneumococcal Polysaccharide-23 07/20/2017   Tdap 01/01/2015   Pertinent  Health Maintenance Due  Topic Date Due   OPHTHALMOLOGY EXAM  09/11/2020   FOOT EXAM  02/08/2021   INFLUENZA VACCINE  03/04/2021   HEMOGLOBIN A1C  08/14/2021   MAMMOGRAM  09/17/2022   COLONOSCOPY (Pts 45-26yr Insurance coverage will need to be confirmed)  05/13/2025   DEXA SCAN  Completed   PNA vac Low Risk Adult  Completed   Fall Risk  02/18/2021 06/18/2020 06/12/2020 02/09/2020 09/26/2019  Falls in the past year? 0 0 0 0 0  Number falls in past yr: 0 0 0 0 0  Injury with Fall? 0 0 0 0 0  Risk for fall due to : No Fall Risks - - - -  Follow up Falls evaluation completed - - - -   Functional Status Survey:    Vitals:   02/18/21 1453  BP: 120/74  Pulse: 79  Resp: 16  Temp: (!) 96.8 F (36 C)  SpO2: 97%  Weight: 124 lb 12.8 oz (56.6 kg)  Height: 5' 2"  (1.575 m)   Body mass index is 22.83  kg/m. Physical Exam Vitals reviewed.  Constitutional:      General: She is not in acute distress.    Appearance: Normal appearance. She is normal weight. She is not ill-appearing or diaphoretic.  HENT:     Head: Normocephalic.     Right Ear: Tympanic membrane, ear canal and external ear normal. There is no impacted cerumen.     Left Ear: Tympanic membrane, ear canal and external ear normal. There is no impacted cerumen.     Nose: Nose normal. No congestion or rhinorrhea.     Mouth/Throat:     Mouth: Mucous membranes are moist.     Pharynx: Oropharynx is clear. No oropharyngeal exudate or posterior oropharyngeal erythema.  Eyes:     General: No scleral icterus.       Right eye: No discharge.        Left eye: No discharge.     Extraocular Movements: Extraocular movements intact.  Conjunctiva/sclera: Conjunctivae normal.     Pupils: Pupils are equal, round, and reactive to light.  Neck:     Vascular: No carotid bruit.  Cardiovascular:     Rate and Rhythm: Normal rate and regular rhythm.     Pulses: Normal pulses.     Heart sounds: Normal heart sounds. No murmur heard.   No friction rub. No gallop.  Pulmonary:     Effort: Pulmonary effort is normal. No respiratory distress.     Breath sounds: Normal breath sounds. No wheezing, rhonchi or rales.  Chest:     Chest wall: No tenderness.  Abdominal:     General: Bowel sounds are normal. There is no distension.     Palpations: Abdomen is soft. There is no mass.     Tenderness: There is no abdominal tenderness. There is no right CVA tenderness, left CVA tenderness, guarding or rebound.  Musculoskeletal:        General: No swelling or tenderness. Normal range of motion.     Cervical back: Normal range of motion. No rigidity or tenderness.     Right lower leg: No edema.     Left lower leg: No edema.  Lymphadenopathy:     Cervical: No cervical adenopathy.  Skin:    General: Skin is warm and dry.     Coloration: Skin is not pale.      Findings: No bruising, erythema, lesion or rash.  Neurological:     Mental Status: She is alert and oriented to person, place, and time.     Cranial Nerves: No cranial nerve deficit.     Sensory: No sensory deficit.     Motor: No weakness.     Coordination: Coordination normal.     Gait: Gait normal.  Psychiatric:        Mood and Affect: Mood normal.        Speech: Speech normal.        Behavior: Behavior normal.        Thought Content: Thought content normal.        Judgment: Judgment normal.    Labs reviewed: Recent Labs    06/11/20 0836 09/10/20 0932 02/11/21 0858  NA 138 141 139  K 4.3 4.2 3.8  CL 105 105 105  CO2 27 29 26   GLUCOSE 160* 129* 120*  BUN 19 27* 22  CREATININE 0.70 0.76 0.69  CALCIUM 9.7 9.6 9.2   Recent Labs    06/11/20 0836 09/10/20 0932  AST 15 14  ALT 20 18  BILITOT 0.4 0.4  PROT 7.1 7.2   Recent Labs    06/11/20 0836 09/10/20 0932  WBC 8.2 8.0  NEUTROABS 4,100 4,224  HGB 12.1 12.2  HCT 36.4 36.7  MCV 89.0 88.4  PLT 390 443*   Lab Results  Component Value Date   TSH 0.73 08/18/2016   Lab Results  Component Value Date   HGBA1C 7.7 (H) 02/11/2021   Lab Results  Component Value Date   CHOL 128 09/10/2020   HDL 46 (L) 09/10/2020   LDLCALC 61 09/10/2020   TRIG 124 09/10/2020   CHOLHDL 2.8 09/10/2020    Significant Diagnostic Results in last 30 days:  No results found.  Assessment/Plan  1. Type 2 diabetes mellitus with other circulatory complication, without long-term current use of insulin (HCC) Lab Results  Component Value Date   HGBA1C 7.7 (H) 02/11/2021  CBG well controlled. Continue on tradjenta  Continue on ASA and statin  - continue on ACE  inhibitor  Up to date on annual eye and foot exam - CBC with Differential/Platelet; Future - CMP with eGFR([facility]); Future - TSH; Future - Lipid panel; Future - Hemoglobin A1c; Future  2. Essential hypertension B/p not at goal today but previous readings have been  normal. - continue on Lisinopril - HCZT  - CBC with Differential/Platelet; Future - CMP with eGFR([facility]); Future - TSH; Future - Lipid panel; Future  3. Mixed hyperlipidemia LDL at gaol - continue on Rosuvastatin  - Lipid panel; Future  4. Chronic left shoulder pain Has improve with therapy Continue to monitor.   Family/ staff Communication: Reviewed plan of care with patient and Husband   Labs/tests ordered:  - CBC with Differential/Platelet - CMP with eGFR([facility]) - TSH - Hgb A1C - Lipid panel  Next Appointment : 6 months for medical management of chronic issues.Fasting Labs prior to visit.    Sandrea Hughs, NP

## 2021-02-18 NOTE — Patient Instructions (Signed)
https://www.nhlbi.nih.gov/files/docs/public/heart/dash_brief.pdf">  DASH Eating Plan DASH stands for Dietary Approaches to Stop Hypertension. The DASH eating plan is a healthy eating plan that has been shown to: Reduce high blood pressure (hypertension). Reduce your risk for type 2 diabetes, heart disease, and stroke. Help with weight loss. What are tips for following this plan? Reading food labels Check food labels for the amount of salt (sodium) per serving. Choose foods with less than 5 percent of the Daily Value of sodium. Generally, foods with less than 300 milligrams (mg) of sodium per serving fit into this eating plan. To find whole grains, look for the word "whole" as the first word in the ingredient list. Shopping Buy products labeled as "low-sodium" or "no salt added." Buy fresh foods. Avoid canned foods and pre-made or frozen meals. Cooking Avoid adding salt when cooking. Use salt-free seasonings or herbs instead of table salt or sea salt. Check with your health care provider or pharmacist before using salt substitutes. Do not fry foods. Cook foods using healthy methods such as baking, boiling, grilling, roasting, and broiling instead. Cook with heart-healthy oils, such as olive, canola, avocado, soybean, or sunflower oil. Meal planning  Eat a balanced diet that includes: 4 or more servings of fruits and 4 or more servings of vegetables each day. Try to fill one-half of your plate with fruits and vegetables. 6-8 servings of whole grains each day. Less than 6 oz (170 g) of lean meat, poultry, or fish each day. A 3-oz (85-g) serving of meat is about the same size as a deck of cards. One egg equals 1 oz (28 g). 2-3 servings of low-fat dairy each day. One serving is 1 cup (237 mL). 1 serving of nuts, seeds, or beans 5 times each week. 2-3 servings of heart-healthy fats. Healthy fats called omega-3 fatty acids are found in foods such as walnuts, flaxseeds, fortified milks, and eggs.  These fats are also found in cold-water fish, such as sardines, salmon, and mackerel. Limit how much you eat of: Canned or prepackaged foods. Food that is high in trans fat, such as some fried foods. Food that is high in saturated fat, such as fatty meat. Desserts and other sweets, sugary drinks, and other foods with added sugar. Full-fat dairy products. Do not salt foods before eating. Do not eat more than 4 egg yolks a week. Try to eat at least 2 vegetarian meals a week. Eat more home-cooked food and less restaurant, buffet, and fast food.  Lifestyle When eating at a restaurant, ask that your food be prepared with less salt or no salt, if possible. If you drink alcohol: Limit how much you use to: 0-1 drink a day for women who are not pregnant. 0-2 drinks a day for men. Be aware of how much alcohol is in your drink. In the [place] one drink equals one 12 oz bottle of beer (355 mL), one 5 oz glass of wine (148 mL), or one 1 oz glass of hard liquor (44 mL). General information Avoid eating more than 2,300 mg of salt a day. If you have hypertension, you may need to reduce your sodium intake to 1,500 mg a day. Work with your health care provider to maintain a healthy body weight or to lose weight. Ask what an ideal weight is for you. Get at least 30 minutes of exercise that causes your heart to beat faster (aerobic exercise) most days of the week. Activities may include walking, swimming, or biking. Work with your health care provider   or dietitian to adjust your eating plan to your individual calorie needs. What foods should I eat? Fruits All fresh, dried, or frozen fruit. Canned fruit in natural juice (without addedsugar). Vegetables Fresh or frozen vegetables (raw, steamed, roasted, or grilled). Low-sodium or reduced-sodium tomato and vegetable juice. Low-sodium or reduced-sodium tomatosauce and tomato paste. Low-sodium or reduced-sodium canned vegetables. Grains Whole-grain or  whole-wheat bread. Whole-grain or whole-wheat pasta. Brown rice. Oatmeal. Quinoa. Bulgur. Whole-grain and low-sodium cereals. Pita bread.Low-fat, low-sodium crackers. Whole-wheat flour tortillas. Meats and other proteins Skinless chicken or turkey. Ground chicken or turkey. Pork with fat trimmed off. Fish and seafood. Egg whites. Dried beans, peas, or lentils. Unsalted nuts, nut butters, and seeds. Unsalted canned beans. Lean cuts of beef with fat trimmed off. Low-sodium, lean precooked or cured meat, such as sausages or meatloaves. Dairy Low-fat (1%) or fat-free (skim) milk. Reduced-fat, low-fat, or fat-free cheeses. Nonfat, low-sodium ricotta or cottage cheese. Low-fat or nonfatyogurt. Low-fat, low-sodium cheese. Fats and oils Soft margarine without trans fats. Vegetable oil. Reduced-fat, low-fat, or light mayonnaise and salad dressings (reduced-sodium). Canola, safflower, olive, avocado, soybean, andsunflower oils. Avocado. Seasonings and condiments Herbs. Spices. Seasoning mixes without salt. Other foods Unsalted popcorn and pretzels. Fat-free sweets. The items listed above may not be a complete list of foods and beverages you can eat. Contact a dietitian for more information. What foods should I avoid? Fruits Canned fruit in a light or heavy syrup. Fried fruit. Fruit in cream or buttersauce. Vegetables Creamed or fried vegetables. Vegetables in a cheese sauce. Regular canned vegetables (not low-sodium or reduced-sodium). Regular canned tomato sauce and paste (not low-sodium or reduced-sodium). Regular tomato and vegetable juice(not low-sodium or reduced-sodium). Pickles. Olives. Grains Baked goods made with fat, such as croissants, muffins, or some breads. Drypasta or rice meal packs. Meats and other proteins Fatty cuts of meat. Ribs. Fried meat. Bacon. Bologna, salami, and other precooked or cured meats, such as sausages or meat loaves. Fat from the back of a pig (fatback). Bratwurst.  Salted nuts and seeds. Canned beans with added salt. Canned orsmoked fish. Whole eggs or egg yolks. Chicken or turkey with skin. Dairy Whole or 2% milk, cream, and half-and-half. Whole or full-fat cream cheese. Whole-fat or sweetened yogurt. Full-fat cheese. Nondairy creamers. Whippedtoppings. Processed cheese and cheese spreads. Fats and oils Butter. Stick margarine. Lard. Shortening. Ghee. Bacon fat. Tropical oils, suchas coconut, palm kernel, or palm oil. Seasonings and condiments Onion salt, garlic salt, seasoned salt, table salt, and sea salt. Worcestershire sauce. Tartar sauce. Barbecue sauce. Teriyaki sauce. Soy sauce, including reduced-sodium. Steak sauce. Canned and packaged gravies. Fish sauce. Oyster sauce. Cocktail sauce. Store-bought horseradish. Ketchup. Mustard. Meat flavorings and tenderizers. Bouillon cubes. Hot sauces. Pre-made or packaged marinades. Pre-made or packaged taco seasonings. Relishes. Regular saladdressings. Other foods Salted popcorn and pretzels. The items listed above may not be a complete list of foods and beverages you should avoid. Contact a dietitian for more information. Where to find more information National Heart, Lung, and Blood Institute: www.nhlbi.nih.gov American Heart Association: www.heart.org Academy of Nutrition and Dietetics: www.eatright.org National Kidney Foundation: www.kidney.org Summary The DASH eating plan is a healthy eating plan that has been shown to reduce high blood pressure (hypertension). It may also reduce your risk for type 2 diabetes, heart disease, and stroke. When on the DASH eating plan, aim to eat more fresh fruits and vegetables, whole grains, lean proteins, low-fat dairy, and heart-healthy fats. With the DASH eating plan, you should limit salt (sodium) intake to 2,300   mg a day. If you have hypertension, you may need to reduce your sodium intake to 1,500 mg a day. Work with your health care provider or dietitian to adjust  your eating plan to your individual calorie needs. This information is not intended to replace advice given to you by your health care provider. Make sure you discuss any questions you have with your healthcare provider. Document Revised: 06/24/2019 Document Reviewed: 06/24/2019 Elsevier Patient Education  2022 Elsevier Inc.  

## 2021-04-01 DIAGNOSIS — E119 Type 2 diabetes mellitus without complications: Secondary | ICD-10-CM | POA: Diagnosis not present

## 2021-04-01 DIAGNOSIS — H25813 Combined forms of age-related cataract, bilateral: Secondary | ICD-10-CM | POA: Diagnosis not present

## 2021-04-01 DIAGNOSIS — H402232 Chronic angle-closure glaucoma, bilateral, moderate stage: Secondary | ICD-10-CM | POA: Insufficient documentation

## 2021-04-24 DIAGNOSIS — M1990 Unspecified osteoarthritis, unspecified site: Secondary | ICD-10-CM | POA: Diagnosis not present

## 2021-04-24 DIAGNOSIS — Z8673 Personal history of transient ischemic attack (TIA), and cerebral infarction without residual deficits: Secondary | ICD-10-CM | POA: Diagnosis not present

## 2021-04-24 DIAGNOSIS — H25011 Cortical age-related cataract, right eye: Secondary | ICD-10-CM | POA: Diagnosis not present

## 2021-04-24 DIAGNOSIS — I10 Essential (primary) hypertension: Secondary | ICD-10-CM | POA: Diagnosis not present

## 2021-04-24 DIAGNOSIS — H2511 Age-related nuclear cataract, right eye: Secondary | ICD-10-CM | POA: Diagnosis not present

## 2021-04-24 DIAGNOSIS — E785 Hyperlipidemia, unspecified: Secondary | ICD-10-CM | POA: Diagnosis not present

## 2021-04-24 DIAGNOSIS — H25812 Combined forms of age-related cataract, left eye: Secondary | ICD-10-CM | POA: Diagnosis not present

## 2021-04-24 DIAGNOSIS — H25811 Combined forms of age-related cataract, right eye: Secondary | ICD-10-CM | POA: Diagnosis not present

## 2021-04-24 DIAGNOSIS — Z7984 Long term (current) use of oral hypoglycemic drugs: Secondary | ICD-10-CM | POA: Diagnosis not present

## 2021-04-24 DIAGNOSIS — Z7982 Long term (current) use of aspirin: Secondary | ICD-10-CM | POA: Diagnosis not present

## 2021-04-24 DIAGNOSIS — H52221 Regular astigmatism, right eye: Secondary | ICD-10-CM | POA: Diagnosis not present

## 2021-04-24 DIAGNOSIS — J45909 Unspecified asthma, uncomplicated: Secondary | ICD-10-CM | POA: Diagnosis not present

## 2021-04-24 DIAGNOSIS — E1136 Type 2 diabetes mellitus with diabetic cataract: Secondary | ICD-10-CM | POA: Diagnosis not present

## 2021-05-08 DIAGNOSIS — E785 Hyperlipidemia, unspecified: Secondary | ICD-10-CM | POA: Diagnosis not present

## 2021-05-08 DIAGNOSIS — I10 Essential (primary) hypertension: Secondary | ICD-10-CM | POA: Diagnosis not present

## 2021-05-08 DIAGNOSIS — Z7982 Long term (current) use of aspirin: Secondary | ICD-10-CM | POA: Diagnosis not present

## 2021-05-08 DIAGNOSIS — M1990 Unspecified osteoarthritis, unspecified site: Secondary | ICD-10-CM | POA: Diagnosis not present

## 2021-05-08 DIAGNOSIS — Z79899 Other long term (current) drug therapy: Secondary | ICD-10-CM | POA: Diagnosis not present

## 2021-05-08 DIAGNOSIS — J45909 Unspecified asthma, uncomplicated: Secondary | ICD-10-CM | POA: Diagnosis not present

## 2021-05-08 DIAGNOSIS — H25812 Combined forms of age-related cataract, left eye: Secondary | ICD-10-CM | POA: Diagnosis not present

## 2021-05-08 DIAGNOSIS — Z7984 Long term (current) use of oral hypoglycemic drugs: Secondary | ICD-10-CM | POA: Diagnosis not present

## 2021-05-08 DIAGNOSIS — E1136 Type 2 diabetes mellitus with diabetic cataract: Secondary | ICD-10-CM | POA: Diagnosis not present

## 2021-05-08 DIAGNOSIS — E119 Type 2 diabetes mellitus without complications: Secondary | ICD-10-CM | POA: Diagnosis not present

## 2021-05-08 DIAGNOSIS — Z8673 Personal history of transient ischemic attack (TIA), and cerebral infarction without residual deficits: Secondary | ICD-10-CM | POA: Diagnosis not present

## 2021-06-03 ENCOUNTER — Other Ambulatory Visit: Payer: Self-pay | Admitting: Family

## 2021-06-03 DIAGNOSIS — E782 Mixed hyperlipidemia: Secondary | ICD-10-CM

## 2021-06-03 DIAGNOSIS — I10 Essential (primary) hypertension: Secondary | ICD-10-CM

## 2021-06-03 MED ORDER — ROSUVASTATIN CALCIUM 10 MG PO TABS: ORAL_TABLET | ORAL | 1 refills | Status: DC

## 2021-06-03 NOTE — Telephone Encounter (Signed)
Pharmacy requested refill

## 2021-06-19 ENCOUNTER — Encounter: Payer: Medicare HMO | Admitting: Family

## 2021-06-28 ENCOUNTER — Other Ambulatory Visit: Payer: Self-pay | Admitting: Family

## 2021-06-28 DIAGNOSIS — E119 Type 2 diabetes mellitus without complications: Secondary | ICD-10-CM

## 2021-07-23 DIAGNOSIS — E119 Type 2 diabetes mellitus without complications: Secondary | ICD-10-CM | POA: Diagnosis not present

## 2021-07-24 ENCOUNTER — Telehealth: Payer: Self-pay | Admitting: *Deleted

## 2021-07-24 NOTE — Telephone Encounter (Signed)
Received fax from [facility] [phone] for Diabetic Supplies.  Spring-Powdered Device for [facility], [address]  To be faxed back to Fax:[phone] once signed. Placed in Dinah's folder to review and sign.   [id number]

## 2021-08-01 DIAGNOSIS — E119 Type 2 diabetes mellitus without complications: Secondary | ICD-10-CM | POA: Diagnosis not present

## 2021-08-02 ENCOUNTER — Telehealth: Payer: Self-pay | Admitting: *Deleted

## 2021-08-02 DIAGNOSIS — Z0100 Encounter for examination of eyes and vision without abnormal findings: Secondary | ICD-10-CM | POA: Diagnosis not present

## 2021-08-02 NOTE — Telephone Encounter (Signed)
Received Detailed Written Order from [facility] for patient's Home Blood Glucose Monitor.  Test 1 time per day.   [id number] Ins: [id number]  Placed in Dinah's folder to review and sign. To be faxed back once completed.

## 2021-08-09 ENCOUNTER — Other Ambulatory Visit: Payer: Self-pay | Admitting: Family

## 2021-08-09 DIAGNOSIS — E119 Type 2 diabetes mellitus without complications: Secondary | ICD-10-CM

## 2021-08-12 ENCOUNTER — Other Ambulatory Visit: Payer: Medicare HMO

## 2021-08-13 ENCOUNTER — Other Ambulatory Visit: Payer: Medicare HMO

## 2021-08-13 ENCOUNTER — Other Ambulatory Visit: Payer: Self-pay

## 2021-08-13 DIAGNOSIS — E1159 Type 2 diabetes mellitus with other circulatory complications: Secondary | ICD-10-CM

## 2021-08-13 DIAGNOSIS — E782 Mixed hyperlipidemia: Secondary | ICD-10-CM | POA: Diagnosis not present

## 2021-08-13 DIAGNOSIS — I10 Essential (primary) hypertension: Secondary | ICD-10-CM

## 2021-08-14 LAB — CBC WITH DIFFERENTIAL/PLATELET
Absolute Monocytes: 495 cells/uL (ref 200–950)
Basophils Absolute: 38 cells/uL (ref 0–200)
Basophils Relative: 0.5 %
Eosinophils Absolute: 90 cells/uL (ref 15–500)
Eosinophils Relative: 1.2 %
HCT: 35.9 % (ref 35.0–45.0)
Hemoglobin: 12 g/dL (ref 11.7–15.5)
Lymphs Abs: 3443 cells/uL (ref 850–3900)
MCH: 30 pg (ref 27.0–33.0)
MCHC: 33.4 g/dL (ref 32.0–36.0)
MCV: 89.8 fL (ref 80.0–100.0)
MPV: 9.1 fL (ref 7.5–12.5)
Monocytes Relative: 6.6 %
Neutro Abs: 3435 cells/uL (ref 1500–7800)
Neutrophils Relative %: 45.8 %
Platelets: 392 10*3/uL (ref 140–400)
RBC: 4 10*6/uL (ref 3.80–5.10)
RDW: 13.4 % (ref 11.0–15.0)
Total Lymphocyte: 45.9 %
WBC: 7.5 10*3/uL (ref 3.8–10.8)

## 2021-08-14 LAB — COMPLETE METABOLIC PANEL WITH GFR
AG Ratio: 1.6 (calc) (ref 1.0–2.5)
ALT: 21 U/L (ref 6–29)
AST: 15 U/L (ref 10–35)
Albumin: 4.2 g/dL (ref 3.6–5.1)
Alkaline phosphatase (APISO): 72 U/L (ref 37–153)
BUN: 21 mg/dL (ref 7–25)
CO2: 28 mmol/L (ref 20–32)
Calcium: 9.4 mg/dL (ref 8.6–10.4)
Chloride: 105 mmol/L (ref 98–110)
Creat: 0.82 mg/dL (ref 0.60–1.00)
Globulin: 2.7 g/dL (calc) (ref 1.9–3.7)
Glucose, Bld: 145 mg/dL — ABNORMAL HIGH (ref 65–99)
Potassium: 4.1 mmol/L (ref 3.5–5.3)
Sodium: 140 mmol/L (ref 135–146)
Total Bilirubin: 0.5 mg/dL (ref 0.2–1.2)
Total Protein: 6.9 g/dL (ref 6.1–8.1)
eGFR: 75 mL/min/{1.73_m2} (ref >=60)

## 2021-08-14 LAB — TSH: TSH: 1.3 mIU/L (ref 0.40–4.50)

## 2021-08-14 LAB — HEMOGLOBIN A1C
Hgb A1c MFr Bld: 8.5 % of total Hgb — ABNORMAL HIGH (ref <5.7)
Mean Plasma Glucose: 197 mg/dL
eAG (mmol/L): 10.9 mmol/L

## 2021-08-14 LAB — LIPID PANEL
Cholesterol: 140 mg/dL (ref <200)
HDL: 55 mg/dL (ref >=50)
LDL Cholesterol (Calc): 64 mg/dL (calc)
Non-HDL Cholesterol (Calc): 85 mg/dL (calc) (ref <130)
Total CHOL/HDL Ratio: 2.5 (calc) (ref <5.0)
Triglycerides: 124 mg/dL (ref <150)

## 2021-08-19 ENCOUNTER — Ambulatory Visit (INDEPENDENT_AMBULATORY_CARE_PROVIDER_SITE_OTHER): Payer: Medicare HMO | Admitting: Family

## 2021-08-19 ENCOUNTER — Other Ambulatory Visit: Payer: Self-pay

## 2021-08-19 ENCOUNTER — Encounter: Payer: Self-pay | Admitting: Family

## 2021-08-19 VITALS — BP 126/70 | HR 73 | Temp 97.5°F | Ht 63.0 in | Wt 127.0 lb

## 2021-08-19 DIAGNOSIS — E782 Mixed hyperlipidemia: Secondary | ICD-10-CM

## 2021-08-19 DIAGNOSIS — I10 Essential (primary) hypertension: Secondary | ICD-10-CM | POA: Diagnosis not present

## 2021-08-19 DIAGNOSIS — E1159 Type 2 diabetes mellitus with other circulatory complications: Secondary | ICD-10-CM

## 2021-08-19 NOTE — Progress Notes (Signed)
Provider: Marlowe Sax FNP-C   Cleatus Goodin, Nelda Bucks, NP  Patient Care Team: Alajiah Dutkiewicz, Nelda Bucks, NP as PCP - General (Family Medicine)  Extended Emergency Contact Information Primary Emergency Contact: Schiffman,Liyana Abran Richard) Address: [place] [address] [address] [place] Phone: [phone] Relation: Spouse  Code Status:  Full Code  Goals of care: Advanced Directive information Advanced Directives 08/19/2021  Does Patient Have a Medical Advance Directive? Yes  Does patient want to make changes to medical advance directive? Yes (MAU/Ambulatory/Procedural Areas - Information given)  Would patient like information on creating a medical advance directive? -     Chief Complaint  Patient presents with   Medical Management of Chronic Issues    6 month follow-up, discuss recent labs (copy printed), and foot exam today. Discuss need for Shingrix, covid boosters, and flu vaccine or post pone if patient refuses     HPI:  Pt is a 76 y.o. female seen today 6 months for medical management of chronic diseases.she is here with her Husband who provides additional HPI information. She denies any acute issues.she has gained weight 2 lbs since previous visit. Likes to eat sweet potatoes,cassava   Due for Flu injection but declines. Discussed to get shingles and COVID-19 booster vaccine at the pharmacy.  Recent lab results reviewed and discussed with patient and Husband.  Glucose 145 ,Hgb A1C 8.5  States CBG at home runs in the 140's.No symptoms of hypo/hyperglycemia reported.    Past Medical History:  Diagnosis Date   Disorder of bone and cartilage, unspecified    Dizziness and giddiness    Hypercalcemia    Hyperlipidemia    Hypertension    Hypopotassemia    Other malaise and fatigue    Other specified disease of white blood cells    Special screening for malignant neoplasms, colon    Type II or unspecified type diabetes mellitus without mention of  complication, not stated as uncontrolled    Unspecified vitamin D deficiency    Past Surgical History:  Procedure Laterality Date   COLONOSCOPY  05/14/15   Dr. Collene Mares   FRACTURE SURGERY  2001   Tibia & fibula   TONSILLECTOMY  1996    Allergies  Allergen Reactions   Metformin And Related Diarrhea   Poison Ivy Extract Rash    Allergies as of 08/19/2021       Reactions   Metformin And Related Diarrhea   Poison Ivy Extract Rash        Medication List        Accurate as of August 19, 2021 10:12 AM. If you have any questions, ask your nurse or doctor.          aspirin EC 325 MG tablet Take 1 tablet (325 mg total) by mouth daily.   Calcium Carbonate-Vitamin D 600-400 MG-UNIT tablet Take 1 tablet by mouth daily.   Jardiance 25 MG Tabs tablet Generic drug: empagliflozin TAKE 1 TABLET(25 MG) BY MOUTH DAILY   lisinopril-hydrochlorothiazide 20-12.5 MG tablet Commonly known as: ZESTORETIC TAKE 1 TABLET BY MOUTH DAILY   omeprazole 40 MG capsule Commonly known as: PRILOSEC Take 40 mg by mouth daily. Reported on 09/10/2015   rosuvastatin 10 MG tablet Commonly known as: CRESTOR Take one tablet by mouth once daily.   Tradjenta 5 MG Tabs tablet Generic drug: linagliptin TAKE 1 TABLET BY MOUTH EVERY DAY        Review of Systems  Constitutional:  Negative for appetite change, chills, fatigue, fever and unexpected weight change.  HENT:  Negative for congestion, dental problem, ear discharge, ear pain, facial swelling, hearing loss, nosebleeds, postnasal drip, rhinorrhea, sinus pressure, sinus pain, sneezing, sore throat, tinnitus and trouble swallowing.   Eyes:  Negative for pain, discharge, redness, itching and visual disturbance.  Respiratory:  Negative for cough, chest tightness, shortness of breath and wheezing.   Cardiovascular:  Negative for chest pain, palpitations and leg swelling.  Gastrointestinal:  Negative for abdominal distention, abdominal pain, blood in  stool, constipation, diarrhea, nausea and vomiting.  Endocrine: Negative for cold intolerance, heat intolerance, polydipsia, polyphagia and polyuria.  Genitourinary:  Negative for difficulty urinating, dysuria, flank pain, frequency and urgency.  Musculoskeletal:  Positive for arthralgias. Negative for back pain, gait problem, joint swelling, myalgias, neck pain and neck stiffness.       Right shoulder pain has improved   Skin:  Negative for color change, pallor, rash and wound.  Neurological:  Negative for dizziness, syncope, speech difficulty, weakness, light-headedness, numbness and headaches.  Hematological:  Does not bruise/bleed easily.  Psychiatric/Behavioral:  Negative for agitation, behavioral problems, confusion, hallucinations, self-injury, sleep disturbance and suicidal ideas. The patient is not nervous/anxious.    Immunization History  Administered Date(s) Administered   Influenza Whole 08/10/2013   Influenza-Unspecified 05/05/2015   PFIZER(Purple Top)SARS-COV-2 Vaccination 09/12/2019, 10/07/2019, 07/18/2020   Pneumococcal Conjugate-13 04/21/2016   Pneumococcal Polysaccharide-23 07/20/2017   Tdap 01/01/2015   Pertinent  Health Maintenance Due  Topic Date Due   FOOT EXAM  02/08/2021   INFLUENZA VACCINE  03/04/2021   HEMOGLOBIN A1C  02/10/2022   OPHTHALMOLOGY EXAM  02/14/2022   MAMMOGRAM  09/17/2022   COLONOSCOPY (Pts 45-70yr Insurance coverage will need to be confirmed)  05/13/2025   DEXA SCAN  Completed   Fall Risk 02/09/2020 06/12/2020 06/18/2020 02/18/2021 08/19/2021  Falls in the past year? 0 0 0 0 0  Was there an injury with Fall? 0 0 0 0 0  Fall Risk Category Calculator 0 0 0 0 0  Fall Risk Category Low Low Low Low Low  Patient Fall Risk Level Low fall risk Low fall risk Low fall risk Low fall risk Low fall risk  Patient at Risk for Falls Due to - - - No Fall Risks No Fall Risks  Fall risk Follow up - - - Falls evaluation completed Falls evaluation completed    Functional Status Survey:    Vitals:   08/19/21 1001  BP: 126/70  Pulse: 73  Temp: (!) 97.5 F (36.4 C)  TempSrc: Temporal  SpO2: 98%  Weight: 127 lb (57.6 kg)  Height: 5' 3"  (1.6 m)   Body mass index is 22.5 kg/m. Physical Exam Vitals reviewed.  Constitutional:      General: She is not in acute distress.    Appearance: Normal appearance. She is normal weight. She is not ill-appearing or diaphoretic.  HENT:     Head: Normocephalic.     Right Ear: Tympanic membrane, ear canal and external ear normal. There is no impacted cerumen.     Left Ear: Tympanic membrane, ear canal and external ear normal. There is no impacted cerumen.     Nose: Nose normal. No congestion or rhinorrhea.     Mouth/Throat:     Mouth: Mucous membranes are moist.     Pharynx: Oropharynx is clear. No oropharyngeal exudate or posterior oropharyngeal erythema.  Eyes:     General: No scleral icterus.       Right  eye: No discharge.        Left eye: No discharge.     Extraocular Movements: Extraocular movements intact.     Conjunctiva/sclera: Conjunctivae normal.     Pupils: Pupils are equal, round, and reactive to light.  Neck:     Vascular: No carotid bruit.  Cardiovascular:     Rate and Rhythm: Normal rate and regular rhythm.     Pulses: Normal pulses.     Heart sounds: Normal heart sounds. No murmur heard.   No friction rub. No gallop.  Pulmonary:     Effort: Pulmonary effort is normal. No respiratory distress.     Breath sounds: Normal breath sounds. No wheezing, rhonchi or rales.  Chest:     Chest wall: No tenderness.  Abdominal:     General: Bowel sounds are normal. There is no distension.     Palpations: Abdomen is soft. There is no mass.     Tenderness: There is no abdominal tenderness. There is no right CVA tenderness, left CVA tenderness, guarding or rebound.  Musculoskeletal:        General: No swelling or tenderness. Normal range of motion.     Cervical back: Normal range of motion.  No rigidity or tenderness.     Right lower leg: No edema.     Left lower leg: No edema.  Lymphadenopathy:     Cervical: No cervical adenopathy.  Skin:    General: Skin is warm and dry.     Coloration: Skin is not pale.     Findings: No bruising, erythema, lesion or rash.  Neurological:     Mental Status: She is alert and oriented to person, place, and time.     Cranial Nerves: No cranial nerve deficit.     Sensory: No sensory deficit.     Motor: No weakness.     Coordination: Coordination normal.     Gait: Gait normal.  Psychiatric:        Mood and Affect: Mood normal.        Speech: Speech normal.        Behavior: Behavior normal.        Thought Content: Thought content normal.        Judgment: Judgment normal.    Labs reviewed: Recent Labs    09/10/20 0932 02/11/21 0858 08/13/21 0905  NA 141 139 140  K 4.2 3.8 4.1  CL 105 105 105  CO2 29 26 28   GLUCOSE 129* 120* 145*  BUN 27* 22 21  CREATININE 0.76 0.69 0.82  CALCIUM 9.6 9.2 9.4   Recent Labs    09/10/20 0932 08/13/21 0905  AST 14 15  ALT 18 21  BILITOT 0.4 0.5  PROT 7.2 6.9   Recent Labs    09/10/20 0932 08/13/21 0905  WBC 8.0 7.5  NEUTROABS 4,224 3,435  HGB 12.2 12.0  HCT 36.7 35.9  MCV 88.4 89.8  PLT 443* 392   Lab Results  Component Value Date   TSH 1.30 08/13/2021   Lab Results  Component Value Date   HGBA1C 8.5 (H) 08/13/2021   Lab Results  Component Value Date   CHOL 140 08/13/2021   HDL 55 08/13/2021   LDLCALC 64 08/13/2021   TRIG 124 08/13/2021   CHOLHDL 2.5 08/13/2021    Significant Diagnostic Results in last 30 days:  No results found.  Assessment/Plan 1. Type 2 diabetes mellitus with other circulatory complication, without long-term current use of insulin (HCC) Lab Results  Component Value  Date   HGBA1C 8.5 (H) 08/13/2021  CBG in the 140's  - continue on Jardiance and tradjenta  -  continue on ASA and Statin for cardiovascular event prevention  - On ACE inhibior for  renal protection. - continue with dietary modification and exercise at least 3 times per week for 30 minutes. - follow up with ophthalmology for annal eye exam and Podiatrist for foot exam. - CBC with Differential/Platelet; Future - BMP with eGFR([facility]); Future - Hemoglobin A1c; Future  2. Essential hypertension B/p at goal  - The current medical regimen is effective; continue present plan and medications. - CBC with Differential/Platelet; Future - BMP with eGFR([facility]); Future  3. Mixed hyperlipidemia LDL at goal  - continue rosuvastatin  The patient is asked to make an attempt to improve diet and exercise patterns.  Family/ staff Communication: Reviewed plan of care with patient and Husband verbalized understanding.  Labs/tests ordered:  - CBC with Differential/Platelet; Future - BMP with eGFR([facility]); Future - Hemoglobin A1c; Future - Lipid Panel; Future  Next Appointment : 4 months for medical management of chronic issues.Fasting Labs prior to visit.    Sandrea Hughs, NP

## 2021-08-21 ENCOUNTER — Ambulatory Visit: Payer: Medicare HMO | Admitting: Family

## 2021-10-23 DIAGNOSIS — E119 Type 2 diabetes mellitus without complications: Secondary | ICD-10-CM | POA: Diagnosis not present

## 2021-11-18 ENCOUNTER — Telehealth: Payer: Self-pay

## 2021-11-18 DIAGNOSIS — E782 Mixed hyperlipidemia: Secondary | ICD-10-CM

## 2021-11-18 MED ORDER — ROSUVASTATIN CALCIUM 10 MG PO TABS: ORAL_TABLET | ORAL | 1 refills | Status: DC

## 2021-11-18 NOTE — Telephone Encounter (Signed)
Refill request received from pharmacy °

## 2021-12-09 ENCOUNTER — Other Ambulatory Visit: Payer: Self-pay | Admitting: Family

## 2021-12-09 DIAGNOSIS — I10 Essential (primary) hypertension: Secondary | ICD-10-CM

## 2021-12-16 ENCOUNTER — Other Ambulatory Visit: Payer: Medicare HMO

## 2021-12-16 DIAGNOSIS — I10 Essential (primary) hypertension: Secondary | ICD-10-CM

## 2021-12-16 DIAGNOSIS — E1159 Type 2 diabetes mellitus with other circulatory complications: Secondary | ICD-10-CM

## 2021-12-16 DIAGNOSIS — E782 Mixed hyperlipidemia: Secondary | ICD-10-CM

## 2021-12-19 ENCOUNTER — Other Ambulatory Visit: Payer: Self-pay | Admitting: Family

## 2021-12-19 DIAGNOSIS — E119 Type 2 diabetes mellitus without complications: Secondary | ICD-10-CM

## 2021-12-23 ENCOUNTER — Ambulatory Visit: Payer: Medicare HMO | Admitting: Family

## 2022-01-03 ENCOUNTER — Other Ambulatory Visit: Payer: Self-pay | Admitting: Family

## 2022-01-03 DIAGNOSIS — E119 Type 2 diabetes mellitus without complications: Secondary | ICD-10-CM

## 2022-01-07 ENCOUNTER — Other Ambulatory Visit: Payer: Self-pay | Admitting: Family

## 2022-01-07 DIAGNOSIS — E119 Type 2 diabetes mellitus without complications: Secondary | ICD-10-CM

## 2022-01-21 DIAGNOSIS — E119 Type 2 diabetes mellitus without complications: Secondary | ICD-10-CM | POA: Diagnosis not present

## 2022-01-31 ENCOUNTER — Other Ambulatory Visit: Payer: Medicare HMO

## 2022-01-31 DIAGNOSIS — E1159 Type 2 diabetes mellitus with other circulatory complications: Secondary | ICD-10-CM | POA: Diagnosis not present

## 2022-01-31 DIAGNOSIS — E782 Mixed hyperlipidemia: Secondary | ICD-10-CM | POA: Diagnosis not present

## 2022-01-31 DIAGNOSIS — I10 Essential (primary) hypertension: Secondary | ICD-10-CM | POA: Diagnosis not present

## 2022-02-01 LAB — BASIC METABOLIC PANEL WITH GFR
BUN: 23 mg/dL (ref 7–25)
CO2: 25 mmol/L (ref 20–32)
Calcium: 9.5 mg/dL (ref 8.6–10.4)
Chloride: 107 mmol/L (ref 98–110)
Creat: 0.82 mg/dL (ref 0.60–1.00)
Glucose, Bld: 153 mg/dL — ABNORMAL HIGH (ref 65–99)
Potassium: 4.3 mmol/L (ref 3.5–5.3)
Sodium: 140 mmol/L (ref 135–146)
eGFR: 75 mL/min/{1.73_m2} (ref >=60)

## 2022-02-01 LAB — CBC WITH DIFFERENTIAL/PLATELET
Absolute Monocytes: 448 cells/uL (ref 200–950)
Basophils Absolute: 63 cells/uL (ref 0–200)
Basophils Relative: 0.9 %
Eosinophils Absolute: 140 cells/uL (ref 15–500)
Eosinophils Relative: 2 %
HCT: 36.9 % (ref 35.0–45.0)
Hemoglobin: 12 g/dL (ref 11.7–15.5)
Lymphs Abs: 3346 cells/uL (ref 850–3900)
MCH: 30 pg (ref 27.0–33.0)
MCHC: 32.5 g/dL (ref 32.0–36.0)
MCV: 92.3 fL (ref 80.0–100.0)
MPV: 9 fL (ref 7.5–12.5)
Monocytes Relative: 6.4 %
Neutro Abs: 3003 cells/uL (ref 1500–7800)
Neutrophils Relative %: 42.9 %
Platelets: 360 10*3/uL (ref 140–400)
RBC: 4 10*6/uL (ref 3.80–5.10)
RDW: 12.9 % (ref 11.0–15.0)
Total Lymphocyte: 47.8 %
WBC: 7 10*3/uL (ref 3.8–10.8)

## 2022-02-01 LAB — LIPID PANEL
Cholesterol: 138 mg/dL (ref <200)
HDL: 60 mg/dL (ref >=50)
LDL Cholesterol (Calc): 56 mg/dL (calc)
Non-HDL Cholesterol (Calc): 78 mg/dL (calc) (ref <130)
Total CHOL/HDL Ratio: 2.3 (calc) (ref <5.0)
Triglycerides: 139 mg/dL (ref <150)

## 2022-02-01 LAB — HEMOGLOBIN A1C
Hgb A1c MFr Bld: 8.7 % of total Hgb — ABNORMAL HIGH (ref <5.7)
Mean Plasma Glucose: 203 mg/dL
eAG (mmol/L): 11.2 mmol/L

## 2022-02-05 ENCOUNTER — Ambulatory Visit (INDEPENDENT_AMBULATORY_CARE_PROVIDER_SITE_OTHER): Payer: Medicare HMO | Admitting: Family

## 2022-02-05 ENCOUNTER — Encounter: Payer: Self-pay | Admitting: Family

## 2022-02-05 VITALS — BP 126/82 | HR 75 | Temp 97.1°F | Resp 16 | Ht 63.0 in | Wt 127.0 lb

## 2022-02-05 DIAGNOSIS — E1159 Type 2 diabetes mellitus with other circulatory complications: Secondary | ICD-10-CM | POA: Diagnosis not present

## 2022-02-05 DIAGNOSIS — Z23 Encounter for immunization: Secondary | ICD-10-CM | POA: Diagnosis not present

## 2022-02-05 DIAGNOSIS — F5101 Primary insomnia: Secondary | ICD-10-CM | POA: Diagnosis not present

## 2022-02-05 DIAGNOSIS — E782 Mixed hyperlipidemia: Secondary | ICD-10-CM

## 2022-02-05 DIAGNOSIS — I10 Essential (primary) hypertension: Secondary | ICD-10-CM

## 2022-02-05 DIAGNOSIS — R69 Illness, unspecified: Secondary | ICD-10-CM | POA: Diagnosis not present

## 2022-02-05 MED ORDER — MELATONIN 3 MG PO TABS: 3.0000 mg | ORAL_TABLET | Freq: Every evening | ORAL | 3 refills | Status: DC | PRN

## 2022-02-05 NOTE — Progress Notes (Signed)
Provider: Marlowe Sax FNP-C   Beverlie Kurihara, Nelda Bucks, NP  Patient Care Team: Lucciana Head, Nelda Bucks, NP as PCP - General (Family Medicine)  Extended Emergency Contact Information Primary Emergency Contact: Valade,Liyana Abran Richard) Address: [place] [address] [place] Phone: [phone] Relation: Spouse  Code Status: Full code Goals of care: Advanced Directive information    02/05/2022   10:13 AM  Advanced Directives  Does Patient Have a Medical Advance Directive? No  Would patient like information on creating a medical advance directive? No - Patient declined     Chief Complaint  Patient presents with   Medical Management of Chronic Issues    6 month follow up   Labs     Discuss recent labs.   Immunizations    Discuss the need for Shingrix vaccine, and Covid Booster.    HPI:  Pt is a 76 y.o. female seen today for 74-monthfollow-up for medical management of chronic diseases. Has a medical history of essential hypertension, type 2 diabetes mellitus with circulatory complication, hyperlipidemia insomnia among others.  She is here with her husband who provides additional HPI information.  Her recent lab results were unremarkable except hemoglobin A1c slightly worsen 8.7 previous was 8.5 and 7.7 Husband states CBG runs in the 130s to 160's.  No symptoms of hypo or hyperglycemia reported.Follows up with ophthalmology and podiatrist on a regular basis.  No complications reported  Health maintenance: She is due for Shingrix and COVID-19 booster vaccine.  Advised to get vaccines at her pharmacy. Also due for second dose of pneumococcal vaccine 23.  Agrees to get pneumococcal 23 vaccine injection today.   Past Medical History:  Diagnosis Date   Disorder of bone and cartilage, unspecified    Dizziness and giddiness    Hypercalcemia    Hyperlipidemia    Hypertension    Hypopotassemia    Other malaise and fatigue    Other specified  disease of white blood cells    Special screening for malignant neoplasms, colon    Type II or unspecified type diabetes mellitus without mention of complication, not stated as uncontrolled    Unspecified vitamin D deficiency    Past Surgical History:  Procedure Laterality Date   COLONOSCOPY  05/14/15   Dr. MCollene Mares  FRACTURE SURGERY  2001   Tibia & fibula   TONSILLECTOMY  1996    Allergies  Allergen Reactions   Metformin And Related Diarrhea   Poison Ivy Extract Rash    Allergies as of 02/05/2022       Reactions   Metformin And Related Diarrhea   Poison Ivy Extract Rash        Medication List        Accurate as of February 05, 2022 11:59 PM. If you have any questions, ask your nurse or doctor.          aspirin EC 325 MG tablet Take 1 tablet (325 mg total) by mouth daily.   Calcium Carbonate-Vitamin D 600-400 MG-UNIT tablet Take 1 tablet by mouth daily.   Jardiance 25 MG Tabs tablet Generic drug: empagliflozin TAKE 1 TABLET(25 MG) BY MOUTH DAILY   lisinopril-hydrochlorothiazide 20-12.5 MG tablet Commonly known as: ZESTORETIC TAKE 1 TABLET BY MOUTH DAILY   melatonin 3 MG Tabs tablet Take 1 tablet (3 mg total) by mouth at bedtime as needed. Started by: DSandrea Hughs NP   omeprazole 40 MG capsule Commonly known as:  PRILOSEC Take 40 mg by mouth daily. Reported on 09/10/2015   rosuvastatin 10 MG tablet Commonly known as: CRESTOR Take one tablet by mouth once daily.   Tradjenta 5 MG Tabs tablet Generic drug: linagliptin TAKE 1 TABLET BY MOUTH EVERY DAY        Review of Systems  Constitutional:  Negative for appetite change, chills, fatigue, fever and unexpected weight change.  HENT:  Negative for congestion, dental problem, ear discharge, ear pain, facial swelling, hearing loss, nosebleeds, postnasal drip, rhinorrhea, sinus pressure, sinus pain, sneezing, sore throat, tinnitus and trouble swallowing.   Eyes:  Negative for pain, discharge, redness, itching  and visual disturbance.  Respiratory:  Negative for cough, chest tightness, shortness of breath and wheezing.   Cardiovascular:  Negative for chest pain, palpitations and leg swelling.  Gastrointestinal:  Negative for abdominal distention, abdominal pain, blood in stool, constipation, diarrhea, nausea and vomiting.  Endocrine: Negative for cold intolerance, heat intolerance, polydipsia, polyphagia and polyuria.  Genitourinary:  Negative for difficulty urinating, dysuria, flank pain, frequency and urgency.  Musculoskeletal:  Positive for arthralgias. Negative for back pain, gait problem, joint swelling, myalgias, neck pain and neck stiffness.       Knee pain    Skin:  Negative for color change, pallor, rash and wound.  Neurological:  Negative for dizziness, syncope, speech difficulty, weakness, light-headedness, numbness and headaches.  Hematological:  Does not bruise/bleed easily.  Psychiatric/Behavioral:  Negative for agitation, behavioral problems, confusion, hallucinations, self-injury, sleep disturbance and suicidal ideas. The patient is not nervous/anxious.     Immunization History  Administered Date(s) Administered   Influenza Whole 08/10/2013   Influenza-Unspecified 05/05/2015   PFIZER(Purple Top)SARS-COV-2 Vaccination 09/12/2019, 10/07/2019, 07/18/2020   Pneumococcal Conjugate-13 04/21/2016   Pneumococcal Polysaccharide-23 07/20/2017, 02/05/2022   Tdap 01/01/2015   Pertinent  Health Maintenance Due  Topic Date Due   OPHTHALMOLOGY EXAM  02/14/2022   INFLUENZA VACCINE  03/04/2022   HEMOGLOBIN A1C  08/02/2022   FOOT EXAM  08/19/2022   MAMMOGRAM  09/17/2022   COLONOSCOPY (Pts 45-7yr Insurance coverage will need to be confirmed)  05/13/2025   DEXA SCAN  Completed      06/12/2020    9:46 AM 06/18/2020    9:23 AM 02/18/2021    3:01 PM 08/19/2021   10:03 AM 02/05/2022   10:13 AM  Fall Risk  Falls in the past year? 0 0 0 0 0  Was there an injury with Fall? 0 0 0 0 0  Fall Risk  Category Calculator 0 0 0 0 0  Fall Risk Category Low Low Low Low Low  Patient Fall Risk Level Low fall risk Low fall risk Low fall risk Low fall risk Low fall risk  Patient at Risk for Falls Due to   No Fall Risks No Fall Risks No Fall Risks  Fall risk Follow up   Falls evaluation completed Falls evaluation completed Falls evaluation completed   Functional Status Survey:    Vitals:   02/05/22 1013  BP: 126/82  Pulse: 75  Resp: 16  Temp: (!) 97.1 F (36.2 C)  SpO2: 97%  Weight: 127 lb (57.6 kg)  Height: 5' 3"  (1.6 m)   Body mass index is 22.5 kg/m. Physical Exam Vitals reviewed.  Constitutional:      General: She is not in acute distress.    Appearance: Normal appearance. She is normal weight. She is not ill-appearing or diaphoretic.  HENT:     Head: Normocephalic.     Right Ear: Tympanic  membrane, ear canal and external ear normal. There is no impacted cerumen.     Left Ear: Tympanic membrane, ear canal and external ear normal. There is no impacted cerumen.     Nose: Nose normal. No congestion or rhinorrhea.     Mouth/Throat:     Mouth: Mucous membranes are moist.     Pharynx: Oropharynx is clear. No oropharyngeal exudate or posterior oropharyngeal erythema.  Eyes:     General: No scleral icterus.       Right eye: No discharge.        Left eye: No discharge.     Extraocular Movements: Extraocular movements intact.     Conjunctiva/sclera: Conjunctivae normal.     Pupils: Pupils are equal, round, and reactive to light.  Neck:     Vascular: No carotid bruit.  Cardiovascular:     Rate and Rhythm: Normal rate and regular rhythm.     Pulses: Normal pulses.     Heart sounds: Normal heart sounds. No murmur heard.    No friction rub. No gallop.  Pulmonary:     Effort: Pulmonary effort is normal. No respiratory distress.     Breath sounds: Normal breath sounds. No wheezing, rhonchi or rales.  Chest:     Chest wall: No tenderness.  Abdominal:     General: Bowel sounds are  normal. There is no distension.     Palpations: Abdomen is soft. There is no mass.     Tenderness: There is no abdominal tenderness. There is no right CVA tenderness, left CVA tenderness, guarding or rebound.  Musculoskeletal:        General: No swelling or tenderness. Normal range of motion.     Cervical back: Normal range of motion. No rigidity or tenderness.     Right lower leg: No edema.     Left lower leg: No edema.  Lymphadenopathy:     Cervical: No cervical adenopathy.  Skin:    General: Skin is warm and dry.     Coloration: Skin is not pale.     Findings: No bruising, erythema, lesion or rash.  Neurological:     Mental Status: She is alert and oriented to person, place, and time.     Cranial Nerves: No cranial nerve deficit.     Sensory: No sensory deficit.     Motor: No weakness.     Coordination: Coordination normal.     Gait: Gait normal.  Psychiatric:        Mood and Affect: Mood normal.        Speech: Speech normal.        Behavior: Behavior normal.        Thought Content: Thought content normal.        Judgment: Judgment normal.     Labs reviewed: Recent Labs    02/11/21 0858 08/13/21 0905 01/31/22 0828  NA 139 140 140  K 3.8 4.1 4.3  CL 105 105 107  CO2 26 28 25   GLUCOSE 120* 145* 153*  BUN 22 21 23   CREATININE 0.69 0.82 0.82  CALCIUM 9.2 9.4 9.5   Recent Labs    08/13/21 0905  AST 15  ALT 21  BILITOT 0.5  PROT 6.9   Recent Labs    08/13/21 0905 01/31/22 0828  WBC 7.5 7.0  NEUTROABS 3,435 3,003  HGB 12.0 12.0  HCT 35.9 36.9  MCV 89.8 92.3  PLT 392 360   Lab Results  Component Value Date   TSH 1.30 08/13/2021  Lab Results  Component Value Date   HGBA1C 8.7 (H) 01/31/2022   Lab Results  Component Value Date   CHOL 138 01/31/2022   HDL 60 01/31/2022   LDLCALC 56 01/31/2022   TRIG 139 01/31/2022   CHOLHDL 2.3 01/31/2022    Significant Diagnostic Results in last 30 days:  No results found.  Assessment/Plan Problem List  Items Addressed This Visit       Cardiovascular and Mediastinum   Essential hypertension    - Blood pressure well controlled -Continue on lisinopril-hydrochlorothiazide -  continue on ASA and Statin for cardiac event prophylaxis -  Dietary modification and exercise at least 3 times per week for 30 minutes advised. Recommended water aerobics due to knee pain           Relevant Orders   COMPLETE METABOLIC PANEL WITH GFR   CBC with Differential/Platelet     Endocrine   Diabetes mellitus ([place]) - Primary    Lab Results  Component Value Date   HGBA1C 8.7 (H) 01/31/2022  -Home reported CBG within normal range. - continue on ASA and Statin for cardiac event prophylaxis - on ACE inhibitor for renal protection  - up to date on annual eye and  foot exam  -Continue on Tradjenta and jaundice daily      Relevant Orders   TSH   COMPLETE METABOLIC PANEL WITH GFR   Hemoglobin A1c     Other   Primary insomnia    - Sleeps about 4 hours and then unable to fall asleep. -Advised to try melatonin 3 mg at bedtime may repeat dose if unable to sleep. -Regular exercise recommended exercise recommended -Nonpharmacological sleep hygiene measures also discussed      Need for pneumococcal vaccination    -Second dose of pneumococcal 23 vaccine administered today by CMA no reaction reported.  -Advised to take OTC Tylenol as needed for any soreness, fever or chills       Relevant Orders   Pneumococcal polysaccharide vaccine 23-valent greater than or equal to 2yo subcutaneous/IM (Completed)   Mixed hyperlipidemia    LDL at goal -Continue on statin -Continue on dietary modification -  exercise at least 3 times per week for 30 minutes advised.      Relevant Orders   Lipid panel     Family/ staff Communication: Reviewed plan of care with patient and husband who verbalized understanding  Labs/tests ordered:   - CBC with Differential/Platelet - CMP with eGFR([facility]) - TSH - Hgb A1C -  Lipid panel   Next Appointment :Return in about 6 months (around 08/08/2022) for medical mangement of chronic issues., Fasting labs in 6 months prior to visit.    Sandrea Hughs, NP

## 2022-02-05 NOTE — Patient Instructions (Signed)
Please contact your local pharmacy, previous provider, or insurance carrier for vaccine/immunization records. Ensure that any procedures done outside of [facility] are faxed to us [phone] or you can sign release of records form at the front desk to keep your medical record updated.   ?

## 2022-02-09 DIAGNOSIS — Z23 Encounter for immunization: Secondary | ICD-10-CM | POA: Insufficient documentation

## 2022-02-09 NOTE — Assessment & Plan Note (Signed)
-   Blood pressure well controlled -Continue on lisinopril-hydrochlorothiazide -  continue on ASA and Statin for cardiac event prophylaxis -  Dietary modification and exercise at least 3 times per week for 30 minutes advised. Recommended water aerobics due to knee pain

## 2022-02-09 NOTE — Assessment & Plan Note (Signed)
LDL at goal -Continue on statin -Continue on dietary modification -  exercise at least 3 times per week for 30 minutes advised.

## 2022-02-09 NOTE — Assessment & Plan Note (Signed)
-   Sleeps about 4 hours and then unable to fall asleep. -Advised to try melatonin 3 mg at bedtime may repeat dose if unable to sleep. -Regular exercise recommended exercise recommended -Nonpharmacological sleep hygiene measures also discussed

## 2022-02-09 NOTE — Assessment & Plan Note (Signed)
-  Second dose of pneumococcal 23 vaccine administered today by CMA no reaction reported.  -Advised to take OTC Tylenol as needed for any soreness, fever or chills

## 2022-02-09 NOTE — Assessment & Plan Note (Signed)
Lab Results  Component Value Date   HGBA1C 8.7 (H) 01/31/2022  -Home reported CBG within normal range. - continue on ASA and Statin for cardiac event prophylaxis - on ACE inhibitor for renal protection  - up to date on annual eye and  foot exam  -Continue on Tradjenta and jaundice daily

## 2022-03-25 ENCOUNTER — Encounter: Payer: Medicare HMO | Admitting: Family

## 2022-03-25 NOTE — Progress Notes (Signed)
This encounter was created in error - please disregard.

## 2022-03-25 NOTE — Progress Notes (Signed)
This encounter was created in error - please disregard. Cancelled

## 2022-04-21 ENCOUNTER — Encounter: Payer: Self-pay | Admitting: Family

## 2022-04-21 ENCOUNTER — Ambulatory Visit (INDEPENDENT_AMBULATORY_CARE_PROVIDER_SITE_OTHER): Payer: Medicare HMO | Admitting: Family

## 2022-04-21 DIAGNOSIS — I10 Essential (primary) hypertension: Secondary | ICD-10-CM

## 2022-04-21 DIAGNOSIS — E782 Mixed hyperlipidemia: Secondary | ICD-10-CM

## 2022-04-21 DIAGNOSIS — E119 Type 2 diabetes mellitus without complications: Secondary | ICD-10-CM | POA: Diagnosis not present

## 2022-04-21 MED ORDER — EMPAGLIFLOZIN 25 MG PO TABS: 25.0000 mg | ORAL_TABLET | Freq: Every day | ORAL | 1 refills | Status: DC

## 2022-04-21 MED ORDER — LINAGLIPTIN 5 MG PO TABS
5.0000 mg | ORAL_TABLET | Freq: Every day | ORAL | 1 refills | Status: DC
Start: 2022-04-21 — End: 2023-03-10

## 2022-04-21 MED ORDER — LISINOPRIL-HYDROCHLOROTHIAZIDE 20-12.5 MG PO TABS
1.0000 | ORAL_TABLET | Freq: Every day | ORAL | 1 refills | Status: DC
Start: 2022-04-21 — End: 2022-12-08

## 2022-04-21 MED ORDER — ROSUVASTATIN CALCIUM 10 MG PO TABS: ORAL_TABLET | ORAL | 1 refills | Status: DC

## 2022-04-21 NOTE — Progress Notes (Signed)
Provider: Marlowe Sax FNP-C  Mitzy Naron, Nelda Bucks, NP  Patient Care Team: Jayren Cease, Nelda Bucks, NP as PCP - General (Family Medicine)  Extended Emergency Contact Information Primary Emergency Contact: Seyer,Liyana Abran Richard) Address: [place] [address] [place] Phone: [phone] Relation: Spouse  Code Status: Full code Goals of care: Advanced Directive information    04/21/2022    9:49 AM  Advanced Directives  Does Patient Have a Medical Advance Directive? No  Would patient like information on creating a medical advance directive? No - Patient declined     Chief Complaint  Patient presents with   Acute Visit    Patient wants to see PCP Anya Murphey, Nelda Bucks, NP before leaving out the county.    Concern     Please check patient right foot where she stepped on nail. She states that it's healed but sometimes she can still feel something present.     HPI:  Pt is a 76 y.o. female seen today for an acute visit for evaluation of evaluation of right foot she stepped on a nail several days ago.  States site has healed but sometimes still feels like something is inside.  She denies any fever, chills, redness or drainage from site.  Also states pain is improved.  Has not required to take any medication for pain. Also requests a 78-monthsupply of all her medications states will be traveling out of the country for at least 3 months.    Past Medical History:  Diagnosis Date   Disorder of bone and cartilage, unspecified    Dizziness and giddiness    Hypercalcemia    Hyperlipidemia    Hypertension    Hypopotassemia    Other malaise and fatigue    Other specified disease of white blood cells    Special screening for malignant neoplasms, colon    Type II or unspecified type diabetes mellitus without mention of complication, not stated as uncontrolled    Unspecified vitamin D deficiency    Past Surgical History:  Procedure Laterality  Date   COLONOSCOPY  05/14/15   Dr. MCollene Mares  FRACTURE SURGERY  2001   Tibia & fibula   TONSILLECTOMY  1996    Allergies  Allergen Reactions   Metformin And Related Diarrhea   Poison Ivy Extract Rash    Outpatient Encounter Medications as of 04/21/2022  Medication Sig   aspirin EC 325 MG tablet Take 1 tablet (325 mg total) by mouth daily.   Calcium Carbonate-Vitamin D 600-400 MG-UNIT tablet Take 1 tablet by mouth daily.   melatonin 3 MG TABS tablet Take 1 tablet (3 mg total) by mouth at bedtime as needed.   omeprazole (PRILOSEC) 40 MG capsule Take 40 mg by mouth daily. Reported on 09/10/2015   [DISCONTINUED] JARDIANCE 25 MG TABS tablet TAKE 1 TABLET(25 MG) BY MOUTH DAILY   [DISCONTINUED] lisinopril-hydrochlorothiazide (ZESTORETIC) 20-12.5 MG tablet TAKE 1 TABLET BY MOUTH DAILY   [DISCONTINUED] rosuvastatin (CRESTOR) 10 MG tablet Take one tablet by mouth once daily.   [DISCONTINUED] TRADJENTA 5 MG TABS tablet TAKE 1 TABLET BY MOUTH EVERY DAY   empagliflozin (JARDIANCE) 25 MG TABS tablet Take 1 tablet (25 mg total) by mouth daily.   linagliptin (TRADJENTA) 5 MG TABS tablet Take 1 tablet (5 mg total) by mouth daily.   lisinopril-hydrochlorothiazide (ZESTORETIC) 20-12.5 MG tablet Take 1 tablet by mouth daily.   rosuvastatin (CRESTOR) 10 MG tablet Take one  tablet by mouth once daily.   No facility-administered encounter medications on file as of 04/21/2022.    Review of Systems  Constitutional:  Negative for appetite change, chills, fatigue, fever and unexpected weight change.  HENT:  Negative for congestion, dental problem, ear discharge, ear pain, facial swelling, hearing loss, nosebleeds, postnasal drip, rhinorrhea, sinus pressure, sinus pain, sneezing, sore throat, tinnitus and trouble swallowing.   Eyes:  Negative for pain, discharge, redness, itching and visual disturbance.  Respiratory:  Negative for cough, chest tightness, shortness of breath and wheezing.   Cardiovascular:  Negative  for chest pain, palpitations and leg swelling.  Gastrointestinal:  Negative for abdominal distention, abdominal pain, blood in stool, constipation, diarrhea, nausea and vomiting.  Endocrine: Negative for cold intolerance, heat intolerance, polydipsia, polyphagia and polyuria.  Genitourinary:  Negative for difficulty urinating, dysuria, flank pain, frequency and urgency.  Musculoskeletal:  Negative for arthralgias, back pain, gait problem, joint swelling, myalgias, neck pain and neck stiffness.  Skin:  Negative for color change, pallor, rash and wound.  Neurological:  Negative for dizziness, syncope, speech difficulty, weakness, light-headedness, numbness and headaches.  Hematological:  Does not bruise/bleed easily.  Psychiatric/Behavioral:  Negative for agitation, behavioral problems, confusion, hallucinations, self-injury, sleep disturbance and suicidal ideas. The patient is not nervous/anxious.     Immunization History  Administered Date(s) Administered   Influenza Whole 08/10/2013   Influenza-Unspecified 05/05/2015   PFIZER(Purple Top)SARS-COV-2 Vaccination 09/12/2019, 10/07/2019, 07/18/2020   Pneumococcal Conjugate-13 04/21/2016   Pneumococcal Polysaccharide-23 07/20/2017, 02/05/2022   Tdap 01/01/2015   Pertinent  Health Maintenance Due  Topic Date Due   OPHTHALMOLOGY EXAM  02/14/2022   INFLUENZA VACCINE  03/04/2022   HEMOGLOBIN A1C  08/02/2022   FOOT EXAM  08/19/2022   MAMMOGRAM  09/17/2022   COLONOSCOPY (Pts 45-23yr Insurance coverage will need to be confirmed)  05/13/2025   DEXA SCAN  Completed      06/18/2020    9:23 AM 02/18/2021    3:01 PM 08/19/2021   10:03 AM 02/05/2022   10:13 AM 04/21/2022    9:49 AM  Fall Risk  Falls in the past year? 0 0 0 0 0  Was there an injury with Fall? 0 0 0 0 0  Fall Risk Category Calculator 0 0 0 0 0  Fall Risk Category Low Low Low Low Low  Patient Fall Risk Level Low fall risk Low fall risk Low fall risk Low fall risk Low fall risk   Patient at Risk for Falls Due to  No Fall Risks No Fall Risks No Fall Risks No Fall Risks  Fall risk Follow up  Falls evaluation completed Falls evaluation completed Falls evaluation completed Falls evaluation completed   Functional Status Survey:    Vitals:   04/21/22 0946  BP: 130/80  Pulse: 72  Resp: 16  Temp: (!) 97 F (36.1 C)  SpO2: 97%  Weight: 122 lb 9.6 oz (55.6 kg)  Height: '5\' 3"'$  (1.6 m)   Body mass index is 21.72 kg/m. Physical Exam Vitals reviewed.  Constitutional:      General: She is not in acute distress.    Appearance: Normal appearance. She is normal weight. She is not ill-appearing or diaphoretic.  HENT:     Head: Normocephalic.     Mouth/Throat:     Mouth: Mucous membranes are moist.     Pharynx: Oropharynx is clear. No oropharyngeal exudate or posterior oropharyngeal erythema.  Eyes:     General: No scleral icterus.  Right eye: No discharge.        Left eye: No discharge.     Conjunctiva/sclera: Conjunctivae normal.     Pupils: Pupils are equal, round, and reactive to light.  Neck:     Vascular: No carotid bruit.  Cardiovascular:     Rate and Rhythm: Normal rate and regular rhythm.     Pulses: Normal pulses.     Heart sounds: Normal heart sounds. No murmur heard.    No friction rub. No gallop.  Pulmonary:     Effort: Pulmonary effort is normal. No respiratory distress.     Breath sounds: Normal breath sounds. No wheezing, rhonchi or rales.  Chest:     Chest wall: No tenderness.  Abdominal:     General: Bowel sounds are normal. There is no distension.     Palpations: Abdomen is soft. There is no mass.     Tenderness: There is no abdominal tenderness. There is no right CVA tenderness, left CVA tenderness, guarding or rebound.  Musculoskeletal:        General: No swelling or tenderness. Normal range of motion.     Cervical back: Normal range of motion. No rigidity or tenderness.     Right lower leg: No edema.     Left lower leg: No edema.   Lymphadenopathy:     Cervical: No cervical adenopathy.  Skin:    General: Skin is warm and dry.     Coloration: Skin is not pale.     Findings: No bruising, erythema, lesion or rash.  Neurological:     Mental Status: She is alert and oriented to person, place, and time.     Cranial Nerves: No cranial nerve deficit.     Sensory: No sensory deficit.     Motor: No weakness.     Coordination: Coordination normal.     Gait: Gait normal.  Psychiatric:        Mood and Affect: Mood normal.        Speech: Speech normal.        Behavior: Behavior normal.        Thought Content: Thought content normal.        Judgment: Judgment normal.     Labs reviewed: Recent Labs    08/13/21 0905 01/31/22 0828  NA 140 140  K 4.1 4.3  CL 105 107  CO2 28 25  GLUCOSE 145* 153*  BUN 21 23  CREATININE 0.82 0.82  CALCIUM 9.4 9.5   Recent Labs    08/13/21 0905  AST 15  ALT 21  BILITOT 0.5  PROT 6.9   Recent Labs    08/13/21 0905 01/31/22 0828  WBC 7.5 7.0  NEUTROABS 3,435 3,003  HGB 12.0 12.0  HCT 35.9 36.9  MCV 89.8 92.3  PLT 392 360   Lab Results  Component Value Date   TSH 1.30 08/13/2021   Lab Results  Component Value Date   HGBA1C 8.7 (H) 01/31/2022   Lab Results  Component Value Date   CHOL 138 01/31/2022   HDL 60 01/31/2022   LDLCALC 56 01/31/2022   TRIG 139 01/31/2022   CHOLHDL 2.3 01/31/2022    Significant Diagnostic Results in last 30 days:  No results found.  Assessment/Plan 1. Type 2 diabetes mellitus without complication, without long-term current use of insulin (HCC) Lab Results  Component Value Date   HGBA1C 8.7 (H) 01/31/2022  -Continue on Tradjenta -  continue on ASA and Statin for cardiac event prophylaxis - on  ACE inhibitor for renal protection  - up to date on foot exam  -Advised to schedule for her annual eye exam with ophthalmologist -Advised to get influenza, COVID and Shingrix vaccine - linagliptin (TRADJENTA) 5 MG TABS tablet; Take 1  tablet (5 mg total) by mouth daily.  Dispense: 90 tablet; Refill: 1 - empagliflozin (JARDIANCE) 25 MG TABS tablet; Take 1 tablet (25 mg total) by mouth daily.  Dispense: 90 tablet; Refill: 1  2. Mixed hyperlipidemia LDL at goal -Continue dietary modification -Continue on rosuvastatin - rosuvastatin (CRESTOR) 10 MG tablet; Take one tablet by mouth once daily.  Dispense: 90 tablet; Refill: 1  3. Essential hypertension Blood pressure goal -Continue lisinopril/hydrochlorothiazide - lisinopril-hydrochlorothiazide (ZESTORETIC) 20-12.5 MG tablet; Take 1 tablet by mouth daily.  Dispense: 90 tablet; Refill: 1  Family/ staff Communication: Reviewed plan of care with patient and husband verbalized understanding  Labs/tests ordered: None   Next Appointment: Has appointment 08/08/2022  Sandrea Hughs, NP

## 2022-06-05 ENCOUNTER — Other Ambulatory Visit: Payer: Self-pay | Admitting: Family

## 2022-06-05 DIAGNOSIS — I10 Essential (primary) hypertension: Secondary | ICD-10-CM

## 2022-06-22 ENCOUNTER — Other Ambulatory Visit: Payer: Self-pay | Admitting: Family

## 2022-06-22 DIAGNOSIS — E119 Type 2 diabetes mellitus without complications: Secondary | ICD-10-CM

## 2022-08-05 ENCOUNTER — Other Ambulatory Visit: Payer: Medicare HMO

## 2022-08-06 ENCOUNTER — Other Ambulatory Visit: Payer: Medicare HMO

## 2022-08-06 DIAGNOSIS — E1159 Type 2 diabetes mellitus with other circulatory complications: Secondary | ICD-10-CM | POA: Diagnosis not present

## 2022-08-06 DIAGNOSIS — E782 Mixed hyperlipidemia: Secondary | ICD-10-CM | POA: Diagnosis not present

## 2022-08-06 DIAGNOSIS — I10 Essential (primary) hypertension: Secondary | ICD-10-CM

## 2022-08-07 LAB — LIPID PANEL
Cholesterol: 153 mg/dL (ref <200)
HDL: 64 mg/dL (ref >=50)
LDL Cholesterol (Calc): 66 mg/dL (calc)
Non-HDL Cholesterol (Calc): 89 mg/dL (calc) (ref <130)
Total CHOL/HDL Ratio: 2.4 (calc) (ref <5.0)
Triglycerides: 152 mg/dL — ABNORMAL HIGH (ref <150)

## 2022-08-07 LAB — CBC WITH DIFFERENTIAL/PLATELET
Absolute Monocytes: 507 cells/uL (ref 200–950)
Basophils Absolute: 80 cells/uL (ref 0–200)
Basophils Relative: 0.9 %
Eosinophils Absolute: 623 cells/uL — ABNORMAL HIGH (ref 15–500)
Eosinophils Relative: 7 %
HCT: 35.7 % (ref 35.0–45.0)
Hemoglobin: 12 g/dL (ref 11.7–15.5)
Lymphs Abs: 3747 cells/uL (ref 850–3900)
MCH: 30.1 pg (ref 27.0–33.0)
MCHC: 33.6 g/dL (ref 32.0–36.0)
MCV: 89.5 fL (ref 80.0–100.0)
MPV: 8.8 fL (ref 7.5–12.5)
Monocytes Relative: 5.7 %
Neutro Abs: 3943 cells/uL (ref 1500–7800)
Neutrophils Relative %: 44.3 %
Platelets: 382 10*3/uL (ref 140–400)
RBC: 3.99 10*6/uL (ref 3.80–5.10)
RDW: 12.5 % (ref 11.0–15.0)
Total Lymphocyte: 42.1 %
WBC: 8.9 10*3/uL (ref 3.8–10.8)

## 2022-08-07 LAB — TSH: TSH: 1.16 mIU/L (ref 0.40–4.50)

## 2022-08-07 LAB — COMPLETE METABOLIC PANEL WITH GFR
AG Ratio: 1.5 (calc) (ref 1.0–2.5)
ALT: 20 U/L (ref 6–29)
AST: 15 U/L (ref 10–35)
Albumin: 4.4 g/dL (ref 3.6–5.1)
Alkaline phosphatase (APISO): 83 U/L (ref 37–153)
BUN: 20 mg/dL (ref 7–25)
CO2: 27 mmol/L (ref 20–32)
Calcium: 9 mg/dL (ref 8.6–10.4)
Chloride: 105 mmol/L (ref 98–110)
Creat: 0.76 mg/dL (ref 0.60–1.00)
Globulin: 2.9 g/dL (calc) (ref 1.9–3.7)
Glucose, Bld: 176 mg/dL — ABNORMAL HIGH (ref 65–99)
Potassium: 4.2 mmol/L (ref 3.5–5.3)
Sodium: 139 mmol/L (ref 135–146)
Total Bilirubin: 0.3 mg/dL (ref 0.2–1.2)
Total Protein: 7.3 g/dL (ref 6.1–8.1)
eGFR: 82 mL/min/{1.73_m2} (ref >=60)

## 2022-08-07 LAB — HEMOGLOBIN A1C
Hgb A1c MFr Bld: 9.2 % of total Hgb — ABNORMAL HIGH (ref <5.7)
Mean Plasma Glucose: 217 mg/dL
eAG (mmol/L): 12 mmol/L

## 2022-08-08 ENCOUNTER — Ambulatory Visit: Payer: Medicare HMO | Admitting: Family

## 2022-08-13 ENCOUNTER — Encounter: Payer: Self-pay | Admitting: Family

## 2022-08-13 ENCOUNTER — Ambulatory Visit (INDEPENDENT_AMBULATORY_CARE_PROVIDER_SITE_OTHER): Payer: Medicare HMO | Admitting: Family

## 2022-08-13 VITALS — BP 120/80 | HR 81 | Temp 96.6°F | Resp 16 | Ht 63.0 in | Wt 125.8 lb

## 2022-08-13 DIAGNOSIS — I10 Essential (primary) hypertension: Secondary | ICD-10-CM | POA: Diagnosis not present

## 2022-08-13 DIAGNOSIS — E782 Mixed hyperlipidemia: Secondary | ICD-10-CM

## 2022-08-13 DIAGNOSIS — E119 Type 2 diabetes mellitus without complications: Secondary | ICD-10-CM | POA: Diagnosis not present

## 2022-08-13 NOTE — Patient Instructions (Signed)
Check blood sugar at home and records on log.please bring log to visit in one one month.Notify provider if Blood sugars are less then 75 or greater than 200

## 2022-08-13 NOTE — Progress Notes (Signed)
Provider: Marlowe Sax FNP-C   Everlena Mackley, Nelda Bucks, NP  Patient Care Team: Karolyne Timmons, Nelda Bucks, NP as PCP - General (Family Medicine)  Extended Emergency Contact Information Primary Emergency Contact: Mapel,Liyana Abran Richard) Address: [place] [address] [place] Phone: [phone] Relation: Spouse  Code Status:  Full Code  Goals of care: Advanced Directive information    08/13/2022   10:39 AM  Advanced Directives  Does Patient Have a Medical Advance Directive? No  Would patient like information on creating a medical advance directive? No - Patient declined     Chief Complaint  Patient presents with   Medical Management of Chronic Issues    6 month follow up.    Health Maintenance    Discuss the need for Diabetic Kidney Evaluation, AWV, and Eye exam.    Immunizations    Discuss the need for Shingrix vaccine, Influenza vaccine, and Covid Booster.    HPI:  Pt is a 77 y.o. female seen today for 6 months medical management of chronic diseases. States had travelled out of state to visit in [place].  Recent lab results reviewed and discussed during visit.   States B/p normal at home.No log for review.she denies any headache,dizziness,vision changes,fatigue,chest tightness,palpitation,chest pain or shortness of breath.     States CBG readings are 140's. Recent labs Glucose 176,A 1 C 9.2 previous 8.7.States has not been eating healthy due to recent holiday season.  Has seen Ophthalmology last on 05/2022.  Triglycerides 152    Due for Influenza ,COVID-19 vaccine and shingles vaccine.   Past Medical History:  Diagnosis Date   Disorder of bone and cartilage, unspecified    Dizziness and giddiness    Hypercalcemia    Hyperlipidemia    Hypertension    Hypopotassemia    Other malaise and fatigue    Other specified disease of white blood cells    Special screening for malignant neoplasms, colon    Type II or unspecified type  diabetes mellitus without mention of complication, not stated as uncontrolled    Unspecified vitamin D deficiency    Past Surgical History:  Procedure Laterality Date   COLONOSCOPY  05/14/15   Dr. Collene Mares   FRACTURE SURGERY  2001   Tibia & fibula   TONSILLECTOMY  1996    Allergies  Allergen Reactions   Metformin And Related Diarrhea   Poison Ivy Extract Rash    Allergies as of 08/13/2022       Reactions   Metformin And Related Diarrhea   Poison Ivy Extract Rash        Medication List        Accurate as of August 13, 2022 11:01 AM. If you have any questions, ask your nurse or doctor.          aspirin EC 325 MG tablet Take 1 tablet (325 mg total) by mouth daily.   Calcium Carbonate-Vitamin D 600-400 MG-UNIT tablet Take 1 tablet by mouth daily.   empagliflozin 25 MG Tabs tablet Commonly known as: Jardiance Take 1 tablet (25 mg total) by mouth daily.   linagliptin 5 MG Tabs tablet Commonly known as: Tradjenta Take 1 tablet (5 mg total) by mouth daily.   lisinopril-hydrochlorothiazide 20-12.5 MG tablet Commonly known as: ZESTORETIC Take 1 tablet by mouth daily.   melatonin 3 MG Tabs tablet Take 1 tablet (3 mg total) by mouth at bedtime as needed.   omeprazole 40 MG capsule Commonly  known as: PRILOSEC Take 40 mg by mouth daily. Reported on 09/10/2015   rosuvastatin 10 MG tablet Commonly known as: CRESTOR Take one tablet by mouth once daily.        Review of Systems  Constitutional:  Negative for appetite change, chills, fatigue, fever and unexpected weight change.  HENT:  Negative for congestion, dental problem, ear discharge, ear pain, facial swelling, hearing loss, nosebleeds, postnasal drip, rhinorrhea, sinus pressure, sinus pain, sneezing, sore throat, tinnitus and trouble swallowing.   Eyes:  Negative for pain, discharge, redness, itching and visual disturbance.  Respiratory:  Negative for cough, chest tightness, shortness of breath and wheezing.    Cardiovascular:  Negative for chest pain, palpitations and leg swelling.  Gastrointestinal:  Negative for abdominal distention, abdominal pain, blood in stool, constipation, diarrhea, nausea and vomiting.  Endocrine: Negative for cold intolerance, heat intolerance, polydipsia, polyphagia and polyuria.  Genitourinary:  Negative for difficulty urinating, dysuria, flank pain, frequency and urgency.  Musculoskeletal:  Negative for arthralgias, back pain, gait problem, joint swelling, myalgias, neck pain and neck stiffness.  Skin:  Negative for color change, pallor, rash and wound.  Neurological:  Negative for dizziness, syncope, speech difficulty, weakness, light-headedness, numbness and headaches.  Hematological:  Does not bruise/bleed easily.  Psychiatric/Behavioral:  Negative for agitation, behavioral problems, confusion, hallucinations, self-injury, sleep disturbance and suicidal ideas. The patient is not nervous/anxious.     Immunization History  Administered Date(s) Administered   Influenza Whole 08/10/2013   Influenza-Unspecified 05/05/2015   PFIZER(Purple Top)SARS-COV-2 Vaccination 09/12/2019, 10/07/2019, 07/18/2020   Pneumococcal Conjugate-13 04/21/2016   Pneumococcal Polysaccharide-23 07/20/2017, 02/05/2022   Tdap 01/01/2015   Pertinent  Health Maintenance Due  Topic Date Due   OPHTHALMOLOGY EXAM  02/14/2022   INFLUENZA VACCINE  03/04/2022   FOOT EXAM  08/19/2022   MAMMOGRAM  09/17/2022   HEMOGLOBIN A1C  02/04/2023   COLONOSCOPY (Pts 45-69yr Insurance coverage will need to be confirmed)  05/13/2025   DEXA SCAN  Completed      02/18/2021    3:01 PM 08/19/2021   10:03 AM 02/05/2022   10:13 AM 04/21/2022    9:49 AM 08/13/2022   10:39 AM  Fall Risk  Falls in the past year? 0 0 0 0 0  Was there an injury with Fall? 0 0 0 0 0  Fall Risk Category Calculator 0 0 0 0 0  Fall Risk Category Low Low Low Low Low  Patient Fall Risk Level Low fall risk Low fall risk Low fall risk Low  fall risk Low fall risk  Patient at Risk for Falls Due to No Fall Risks No Fall Risks No Fall Risks No Fall Risks No Fall Risks  Fall risk Follow up Falls evaluation completed Falls evaluation completed Falls evaluation completed Falls evaluation completed Falls evaluation completed   Functional Status Survey:    Vitals:   08/13/22 1034  BP: 120/80  Pulse: 81  Resp: 16  Temp: (!) 96.6 F (35.9 C)  SpO2: 98%  Weight: 125 lb 12.8 oz (57.1 kg)  Height: '5\' 3"'$  (1.6 m)   Body mass index is 22.28 kg/m. Physical Exam Vitals reviewed.  Constitutional:      General: She is not in acute distress.    Appearance: Normal appearance. She is normal weight. She is not ill-appearing or diaphoretic.  HENT:     Head: Normocephalic.     Right Ear: Tympanic membrane, ear canal and external ear normal. There is no impacted cerumen.     Left Ear:  Tympanic membrane, ear canal and external ear normal. There is no impacted cerumen.     Nose: Nose normal. No congestion or rhinorrhea.     Mouth/Throat:     Mouth: Mucous membranes are moist.     Pharynx: Oropharynx is clear. No oropharyngeal exudate or posterior oropharyngeal erythema.  Eyes:     General: No scleral icterus.       Right eye: No discharge.        Left eye: No discharge.     Extraocular Movements: Extraocular movements intact.     Conjunctiva/sclera: Conjunctivae normal.     Pupils: Pupils are equal, round, and reactive to light.  Neck:     Vascular: No carotid bruit.  Cardiovascular:     Rate and Rhythm: Normal rate and regular rhythm.     Pulses: Normal pulses.     Heart sounds: Normal heart sounds. No murmur heard.    No friction rub. No gallop.  Pulmonary:     Effort: Pulmonary effort is normal. No respiratory distress.     Breath sounds: Normal breath sounds. No wheezing, rhonchi or rales.  Chest:     Chest wall: No tenderness.  Abdominal:     General: Bowel sounds are normal. There is no distension.     Palpations:  Abdomen is soft. There is no mass.     Tenderness: There is no abdominal tenderness. There is no right CVA tenderness, left CVA tenderness, guarding or rebound.  Musculoskeletal:        General: No swelling or tenderness. Normal range of motion.     Cervical back: Normal range of motion. No rigidity or tenderness.     Right lower leg: No edema.     Left lower leg: No edema.  Lymphadenopathy:     Cervical: No cervical adenopathy.  Skin:    General: Skin is warm and dry.     Coloration: Skin is not pale.     Findings: No bruising, erythema, lesion or rash.  Neurological:     Mental Status: She is alert and oriented to person, place, and time.     Cranial Nerves: No cranial nerve deficit.     Sensory: No sensory deficit.     Motor: No weakness.     Coordination: Coordination normal.     Gait: Gait normal.  Psychiatric:        Mood and Affect: Mood normal.        Speech: Speech normal.        Behavior: Behavior normal.        Thought Content: Thought content normal.        Judgment: Judgment normal.    Labs reviewed: Recent Labs    01/31/22 0828 08/06/22 0831  NA 140 139  K 4.3 4.2  CL 107 105  CO2 25 27  GLUCOSE 153* 176*  BUN 23 20  CREATININE 0.82 0.76  CALCIUM 9.5 9.0   Recent Labs    08/06/22 0831  AST 15  ALT 20  BILITOT 0.3  PROT 7.3   Recent Labs    01/31/22 0828 08/06/22 0831  WBC 7.0 8.9  NEUTROABS 3,003 3,943  HGB 12.0 12.0  HCT 36.9 35.7  MCV 92.3 89.5  PLT 360 382   Lab Results  Component Value Date   TSH 1.16 08/06/2022   Lab Results  Component Value Date   HGBA1C 9.2 (H) 08/06/2022   Lab Results  Component Value Date   CHOL 153 08/06/2022  HDL 64 08/06/2022   LDLCALC 66 08/06/2022   TRIG 152 (H) 08/06/2022   CHOLHDL 2.4 08/06/2022    Significant Diagnostic Results in last 30 days:  No results found.  Assessment/Plan 1. Type 2 diabetes mellitus without complication, without long-term current use of insulin (HCC) Lab Results   Component Value Date   HGBA1C 9.2 (H) 08/06/2022  Will have her check CBG then return in 2 weeks for evaluation.willconsider adding another diabetic medication if CBG still high.  - dietary modification and exercise advised. - continue on Statin and ASA  - Microalbumin / creatinine urine ratio  2. Mixed hyperlipidemia LDL at goal  - continue on rosuvastatin  - dietary modification and exercise as above   3. Essential hypertension B/p well controlled  Lisinopril- HCZT   Family/ staff Communication: Reviewed plan of care with patient and Husband verbalized understanding   Labs/tests ordered: None   Next Appointment : Return in about 6 months (around 02/11/2023) for medical mangement of chronic issues., Fasting labs in 6 months prior to visit.one month for CBG F/U .   Sandrea Hughs, NP

## 2022-08-14 LAB — MICROALBUMIN / CREATININE URINE RATIO
Creatinine, Urine: 36 mg/dL (ref 20–275)
Microalb Creat Ratio: 8 mcg/mg creat (ref <30)
Microalb, Ur: 0.3 mg/dL

## 2022-09-15 ENCOUNTER — Other Ambulatory Visit: Payer: Medicare HMO

## 2022-09-15 ENCOUNTER — Other Ambulatory Visit: Payer: Self-pay | Admitting: Family

## 2022-09-17 ENCOUNTER — Encounter: Payer: Self-pay | Admitting: Family

## 2022-09-17 ENCOUNTER — Ambulatory Visit (INDEPENDENT_AMBULATORY_CARE_PROVIDER_SITE_OTHER): Payer: Medicare HMO | Admitting: Family

## 2022-09-17 VITALS — BP 121/88 | HR 69 | Temp 97.1°F | Resp 18 | Ht 63.0 in | Wt 127.5 lb

## 2022-09-17 DIAGNOSIS — E119 Type 2 diabetes mellitus without complications: Secondary | ICD-10-CM | POA: Diagnosis not present

## 2022-09-17 DIAGNOSIS — M25571 Pain in right ankle and joints of right foot: Secondary | ICD-10-CM | POA: Diagnosis not present

## 2022-09-17 DIAGNOSIS — G8929 Other chronic pain: Secondary | ICD-10-CM

## 2022-09-17 MED ORDER — OMEPRAZOLE 40 MG PO CPDR: 40.0000 mg | DELAYED_RELEASE_CAPSULE | Freq: Every day | ORAL | 3 refills | Status: AC | PRN

## 2022-09-17 NOTE — Patient Instructions (Signed)

## 2022-09-17 NOTE — Progress Notes (Signed)
Provider: Marlowe Sax FNP-C  Myrlene Riera, Nelda Bucks, NP  Patient Care Team: Sloane Junkin, Nelda Bucks, NP as PCP - General (Family Medicine)  Extended Emergency Contact Information Primary Emergency Contact: Gibbard,Liyana Abran Richard) Address: [place] [address] [place] of [place] Phone: [phone] Relation: Spouse  Code Status: Full Code  Goals of care: Advanced Directive information    08/13/2022   10:39 AM  Advanced Directives  Does Patient Have a Medical Advance Directive? No  Would patient like information on creating a medical advance directive? No - Patient declined     Chief Complaint  Patient presents with   Follow-up    One month Follow-up    HPI:  Pt is a 77 y.o. female seen today for an acute visit for one month follow up for high blood sugars.Here with husband.she brought her CBG log fasting readings in the 120's - 140's. Denies any symptoms of hypoglycemia.  Also complains on right leg and ankle pain states had a rod place in the past but feels like the metal is not aligned well.wonders whether it can be re corrected.,would like to have X-ray.   Past Medical History:  Diagnosis Date   Disorder of bone and cartilage, unspecified    Dizziness and giddiness    Hypercalcemia    Hyperlipidemia    Hypertension    Hypopotassemia    Other malaise and fatigue    Other specified disease of white blood cells    Special screening for malignant neoplasms, colon    Type II or unspecified type diabetes mellitus without mention of complication, not stated as uncontrolled    Unspecified vitamin D deficiency    Past Surgical History:  Procedure Laterality Date   COLONOSCOPY  05/14/15   Dr. Collene Mares   FRACTURE SURGERY  2001   Tibia & fibula   TONSILLECTOMY  1996    Allergies  Allergen Reactions   Metformin And Related Diarrhea   Poison Ivy Extract Rash    Outpatient Encounter Medications as of 09/17/2022  Medication Sig   aspirin EC  325 MG tablet Take 1 tablet (325 mg total) by mouth daily.   Calcium Carbonate-Vitamin D 600-400 MG-UNIT tablet Take 1 tablet by mouth daily.   empagliflozin (JARDIANCE) 25 MG TABS tablet Take 1 tablet (25 mg total) by mouth daily.   linagliptin (TRADJENTA) 5 MG TABS tablet Take 1 tablet (5 mg total) by mouth daily.   lisinopril-hydrochlorothiazide (ZESTORETIC) 20-12.5 MG tablet Take 1 tablet by mouth daily.   melatonin 3 MG TABS tablet Take 1 tablet (3 mg total) by mouth at bedtime as needed.   rosuvastatin (CRESTOR) 10 MG tablet Take one tablet by mouth once daily.   [DISCONTINUED] omeprazole (PRILOSEC) 40 MG capsule Take 40 mg by mouth daily. Reported on 09/10/2015   omeprazole (PRILOSEC) 40 MG capsule Take 1 capsule (40 mg total) by mouth daily as needed. Reported on 09/10/2015   No facility-administered encounter medications on file as of 09/17/2022.    Review of Systems  Constitutional:  Negative for appetite change, chills, fatigue, fever and unexpected weight change.  HENT:  Negative for congestion, dental problem, ear discharge, ear pain, facial swelling, hearing loss, nosebleeds, postnasal drip, rhinorrhea, sinus pressure, sinus pain, sneezing, sore throat, tinnitus and trouble swallowing.   Eyes:  Negative for pain, discharge, redness, itching and visual disturbance.  Respiratory:  Negative for cough, chest tightness, shortness of breath and wheezing.   Cardiovascular:  Negative for chest pain, palpitations and leg swelling.  Gastrointestinal:  Negative for abdominal distention, abdominal pain, blood in stool, constipation, diarrhea, nausea and vomiting.  Endocrine: Negative for cold intolerance, heat intolerance, polydipsia, polyphagia and polyuria.  Genitourinary:  Negative for difficulty urinating, dysuria, flank pain, frequency and urgency.  Musculoskeletal:  Positive for arthralgias. Negative for back pain, gait problem, joint swelling, myalgias, neck pain and neck stiffness.        Right ankle and lower leg pain  Skin:  Positive for wound. Negative for color change, pallor and rash.  Neurological:  Negative for dizziness, syncope, speech difficulty, weakness, light-headedness, numbness and headaches.  Hematological:  Does not bruise/bleed easily.  Psychiatric/Behavioral:  Negative for agitation, behavioral problems, confusion, hallucinations, self-injury, sleep disturbance and suicidal ideas. The patient is not nervous/anxious.     Immunization History  Administered Date(s) Administered   Influenza Whole 08/10/2013   Influenza-Unspecified 05/05/2015   PFIZER(Purple Top)SARS-COV-2 Vaccination 09/12/2019, 10/07/2019, 07/18/2020   Pneumococcal Conjugate-13 04/21/2016   Pneumococcal Polysaccharide-23 07/20/2017, 02/05/2022   Tdap 01/01/2015   Pertinent  Health Maintenance Due  Topic Date Due   OPHTHALMOLOGY EXAM  02/14/2022   INFLUENZA VACCINE  03/04/2022   MAMMOGRAM  09/17/2022   FOOT EXAM  08/19/2022   HEMOGLOBIN A1C  02/04/2023   COLONOSCOPY (Pts 45-54yr Insurance coverage will need to be confirmed)  05/13/2025   DEXA SCAN  Completed      08/19/2021   10:03 AM 02/05/2022   10:13 AM 04/21/2022    9:49 AM 08/13/2022   10:39 AM 09/17/2022    2:34 PM  Fall Risk  Falls in the past year? 0 0 0 0 0  Was there an injury with Fall? 0 0 0 0 0  Fall Risk Category Calculator 0 0 0 0 0  Fall Risk Category (Retired) Low Low Low Low   (RETIRED) Patient Fall Risk Level Low fall risk Low fall risk Low fall risk Low fall risk   Patient at Risk for Falls Due to No Fall Risks No Fall Risks No Fall Risks No Fall Risks No Fall Risks  Fall risk Follow up Falls evaluation completed Falls evaluation completed Falls evaluation completed Falls evaluation completed Falls evaluation completed   Functional Status Survey:    Vitals:   09/17/22 1434  BP: 121/88  Pulse: 69  Resp: 18  Temp: (!) 97.1 F (36.2 C)  SpO2: 98%  Weight: 127 lb 8 oz (57.8 kg)  Height: 5' 3"$  (1.6 m)    Body mass index is 22.59 kg/m. Physical Exam Vitals reviewed.  Constitutional:      General: She is not in acute distress.    Appearance: Normal appearance. She is normal weight. She is not ill-appearing or diaphoretic.  HENT:     Head: Normocephalic.     Mouth/Throat:     Mouth: Mucous membranes are moist.     Pharynx: Oropharynx is clear. No oropharyngeal exudate or posterior oropharyngeal erythema.  Eyes:     General: No scleral icterus.       Right eye: No discharge.        Left eye: No discharge.     Extraocular Movements: Extraocular movements intact.     Conjunctiva/sclera: Conjunctivae normal.     Pupils: Pupils are equal, round, and reactive to light.  Neck:     Vascular: No carotid bruit.  Cardiovascular:     Rate and Rhythm: Normal rate and regular rhythm.     Pulses: Normal pulses.  Heart sounds: Normal heart sounds. No murmur heard.    No friction rub. No gallop.  Pulmonary:     Effort: Pulmonary effort is normal. No respiratory distress.     Breath sounds: Normal breath sounds. No wheezing, rhonchi or rales.  Chest:     Chest wall: No tenderness.  Abdominal:     General: Bowel sounds are normal. There is no distension.     Palpations: Abdomen is soft. There is no mass.     Tenderness: There is no abdominal tenderness. There is no right CVA tenderness, left CVA tenderness, guarding or rebound.  Musculoskeletal:        General: No swelling or tenderness. Normal range of motion.     Cervical back: Normal range of motion. No rigidity or tenderness.     Right lower leg: No edema.     Left lower leg: No edema.     Left ankle: Normal.  Lymphadenopathy:     Cervical: No cervical adenopathy.  Skin:    General: Skin is warm and dry.     Coloration: Skin is not pale.     Findings: No bruising, erythema, lesion or rash.  Neurological:     Mental Status: She is alert and oriented to person, place, and time.     Cranial Nerves: No cranial nerve deficit.      Sensory: No sensory deficit.     Motor: No weakness.     Coordination: Coordination normal.     Gait: Gait normal.  Psychiatric:        Mood and Affect: Mood normal.        Speech: Speech normal.        Behavior: Behavior normal.     Labs reviewed: Recent Labs    01/31/22 0828 08/06/22 0831  NA 140 139  K 4.3 4.2  CL 107 105  CO2 25 27  GLUCOSE 153* 176*  BUN 23 20  CREATININE 0.82 0.76  CALCIUM 9.5 9.0   Recent Labs    08/06/22 0831  AST 15  ALT 20  BILITOT 0.3  PROT 7.3   Recent Labs    01/31/22 0828 08/06/22 0831  WBC 7.0 8.9  NEUTROABS 3,003 3,943  HGB 12.0 12.0  HCT 36.9 35.7  MCV 92.3 89.5  PLT 360 382   Lab Results  Component Value Date   TSH 1.16 08/06/2022   Lab Results  Component Value Date   HGBA1C 9.2 (H) 08/06/2022   Lab Results  Component Value Date   CHOL 153 08/06/2022   HDL 64 08/06/2022   LDLCALC 66 08/06/2022   TRIG 152 (H) 08/06/2022   CHOLHDL 2.4 08/06/2022    Significant Diagnostic Results in last 30 days:  No results found.  Assessment/Plan 1. Chronic pain of right ankle Hx of fracture with repair several years ago.Has a rod in place  - Advised to get right ankle and Tibia/fibula  X-ray at [place] [facility] then will call you with results.   - DG Ankle Complete Right; Future - DG Tibia/Fibula Right; Future  2. Type 2 diabetes mellitus without complication, without long-term current use of insulin (HCC) Lab Results  Component Value Date   HGBA1C 9.2 (H) 08/06/2022  CBG have improved  - continue on Tradjenta and Jardiance  - will refer to Nutritionist to assist with meal planning  - Ambulatory referral to diabetic education  Family/ staff Communication: Reviewed plan of care with patient and Husband verbalized understanding  Labs/tests ordered:   - DG Ankle Complete Right; Future - DG Tibia/Fibula Right; Future  Next Appointment:Return if symptoms worsen or fail to  improve.    Sandrea Hughs, NP

## 2022-11-25 ENCOUNTER — Other Ambulatory Visit: Payer: Self-pay | Admitting: Family

## 2022-11-25 DIAGNOSIS — E782 Mixed hyperlipidemia: Secondary | ICD-10-CM

## 2022-12-08 ENCOUNTER — Other Ambulatory Visit: Payer: Self-pay | Admitting: Family

## 2022-12-08 DIAGNOSIS — I10 Essential (primary) hypertension: Secondary | ICD-10-CM

## 2022-12-16 ENCOUNTER — Other Ambulatory Visit: Payer: Self-pay | Admitting: Family

## 2022-12-16 DIAGNOSIS — E119 Type 2 diabetes mellitus without complications: Secondary | ICD-10-CM

## 2023-01-20 ENCOUNTER — Encounter: Payer: Self-pay | Admitting: Family

## 2023-02-09 ENCOUNTER — Other Ambulatory Visit: Payer: Self-pay | Admitting: Family

## 2023-02-09 ENCOUNTER — Other Ambulatory Visit: Payer: Medicare HMO

## 2023-02-09 DIAGNOSIS — E782 Mixed hyperlipidemia: Secondary | ICD-10-CM

## 2023-02-09 DIAGNOSIS — I10 Essential (primary) hypertension: Secondary | ICD-10-CM

## 2023-02-09 DIAGNOSIS — E119 Type 2 diabetes mellitus without complications: Secondary | ICD-10-CM

## 2023-02-10 LAB — COMPLETE METABOLIC PANEL WITH GFR
AG Ratio: 1.5 (calc) (ref 1.0–2.5)
ALT: 17 U/L (ref 6–29)
AST: 13 U/L (ref 10–35)
Albumin: 4.2 g/dL (ref 3.6–5.1)
Alkaline phosphatase (APISO): 73 U/L (ref 37–153)
BUN: 19 mg/dL (ref 7–25)
CO2: 26 mmol/L (ref 20–32)
Calcium: 9.5 mg/dL (ref 8.6–10.4)
Chloride: 104 mmol/L (ref 98–110)
Creat: 0.77 mg/dL (ref 0.60–1.00)
Globulin: 2.8 g/dL (calc) (ref 1.9–3.7)
Glucose, Bld: 157 mg/dL — ABNORMAL HIGH (ref 65–99)
Potassium: 4.2 mmol/L (ref 3.5–5.3)
Sodium: 138 mmol/L (ref 135–146)
Total Bilirubin: 0.4 mg/dL (ref 0.2–1.2)
Total Protein: 7 g/dL (ref 6.1–8.1)
eGFR: 80 mL/min/{1.73_m2} (ref >=60)

## 2023-02-10 LAB — CBC WITH DIFFERENTIAL/PLATELET
Absolute Monocytes: 430 cells/uL (ref 200–950)
Basophils Absolute: 60 cells/uL (ref 0–200)
Basophils Relative: 0.7 %
Eosinophils Absolute: 146 cells/uL (ref 15–500)
Eosinophils Relative: 1.7 %
HCT: 36.1 % (ref 35.0–45.0)
Hemoglobin: 11.8 g/dL (ref 11.7–15.5)
Lymphs Abs: 3999 cells/uL — ABNORMAL HIGH (ref 850–3900)
MCH: 29 pg (ref 27.0–33.0)
MCHC: 32.7 g/dL (ref 32.0–36.0)
MCV: 88.7 fL (ref 80.0–100.0)
MPV: 8.9 fL (ref 7.5–12.5)
Monocytes Relative: 5 %
Neutro Abs: 3965 cells/uL (ref 1500–7800)
Neutrophils Relative %: 46.1 %
Platelets: 347 10*3/uL (ref 140–400)
RBC: 4.07 10*6/uL (ref 3.80–5.10)
RDW: 13.1 % (ref 11.0–15.0)
Total Lymphocyte: 46.5 %
WBC: 8.6 10*3/uL (ref 3.8–10.8)

## 2023-02-10 LAB — LIPID PANEL
Cholesterol: 140 mg/dL (ref <200)
HDL: 59 mg/dL (ref >=50)
LDL Cholesterol (Calc): 58 mg/dL (calc)
Non-HDL Cholesterol (Calc): 81 mg/dL (calc) (ref <130)
Total CHOL/HDL Ratio: 2.4 (calc) (ref <5.0)
Triglycerides: 146 mg/dL (ref <150)

## 2023-02-10 LAB — HEMOGLOBIN A1C
Hgb A1c MFr Bld: 8.2 % of total Hgb — ABNORMAL HIGH (ref <5.7)
Mean Plasma Glucose: 189 mg/dL
eAG (mmol/L): 10.4 mmol/L

## 2023-02-10 LAB — TSH: TSH: 1.81 mIU/L (ref 0.40–4.50)

## 2023-02-13 ENCOUNTER — Ambulatory Visit: Payer: Medicare HMO | Admitting: Family

## 2023-02-17 ENCOUNTER — Other Ambulatory Visit: Payer: Self-pay

## 2023-02-17 DIAGNOSIS — E119 Type 2 diabetes mellitus without complications: Secondary | ICD-10-CM

## 2023-02-23 ENCOUNTER — Ambulatory Visit (INDEPENDENT_AMBULATORY_CARE_PROVIDER_SITE_OTHER): Payer: Medicare HMO | Admitting: Family

## 2023-02-23 ENCOUNTER — Encounter: Payer: Self-pay | Admitting: Family

## 2023-02-23 VITALS — BP 118/68 | HR 74 | Temp 98.6°F | Resp 18 | Ht 63.0 in | Wt 124.0 lb

## 2023-02-23 DIAGNOSIS — R252 Cramp and spasm: Secondary | ICD-10-CM

## 2023-02-23 DIAGNOSIS — R21 Rash and other nonspecific skin eruption: Secondary | ICD-10-CM | POA: Diagnosis not present

## 2023-02-23 DIAGNOSIS — I10 Essential (primary) hypertension: Secondary | ICD-10-CM | POA: Diagnosis not present

## 2023-02-23 DIAGNOSIS — E119 Type 2 diabetes mellitus without complications: Secondary | ICD-10-CM

## 2023-02-23 DIAGNOSIS — E785 Hyperlipidemia, unspecified: Secondary | ICD-10-CM

## 2023-02-23 DIAGNOSIS — T7840XA Allergy, unspecified, initial encounter: Secondary | ICD-10-CM

## 2023-02-23 MED ORDER — TRIAMCINOLONE ACETONIDE 0.1 % EX CREA
1.0000 | TOPICAL_CREAM | Freq: Two times a day (BID) | CUTANEOUS | 0 refills | Status: AC
Start: 2023-02-23 — End: ?

## 2023-02-23 NOTE — Progress Notes (Signed)
Provider: Richarda Blade FNP-C   Lashon Beringer, Donalee Citrin, NP  Patient Care Team: Apryle Stowell, Donalee Citrin, NP as PCP - General (Family Medicine) Herschel Senegal, OD (Optometry)  Extended Emergency Contact Information Primary Emergency Contact: Mcaulay,Liyana Wille Glaser) Address: [address]          [place] [address] [place] of [place] Home Phone: [phone] Relation: Spouse  Code Status:  Full Code  Goals of care: Advanced Directive information    02/23/2023    8:27 AM  Advanced Directives  Does Patient Have a Medical Advance Directive? No  Would patient like information on creating a medical advance directive? No - Patient declined     Chief Complaint  Patient presents with   Medical Management of Chronic Issues    Patient is here for a 76M F/U for chronic conditions   Quality Metric Gaps    Patient is due for foot and eye exam,AWV,covid 19 booster, and discuss need for shingrix    HPI:  Pt is a 77 y.o. female seen today for medical management of chronic diseases.  She is here with her husband.  Recent lab results reviewed and discussed during visit.  Type 2 DM - 130's -140 has changed diet by cutting down rice in the diet and eating more veggies and protein.  Has been exercise by walking.  Has had leg cramping unclear with her from walking. Recent hemoglobin A1c slightly improved from 9.2 from 8.2 Follows up with her daughter who is a ophthalmology. Had cancel podiatrist appointment.   GERD - has been taking Omeprazole as needed   Hypertension - does not check blood pressure at home but denies any headache,dizziness,vision changes,fatigue,chest tightness,palpitation,chest pain or shortness of breath.         Past Medical History:  Diagnosis Date   Disorder of bone and cartilage, unspecified    Dizziness and giddiness    Hypercalcemia    Hyperlipidemia    Hypertension    Hypopotassemia    Other malaise and fatigue    Other specified disease of white blood  cells    Special screening for malignant neoplasms, colon    Type II or unspecified type diabetes mellitus without mention of complication, not stated as uncontrolled    Unspecified vitamin D deficiency    Past Surgical History:  Procedure Laterality Date   COLONOSCOPY  05/14/15   Dr. Loreta Ave   FRACTURE SURGERY  2001   Tibia & fibula   TONSILLECTOMY  1996    Allergies  Allergen Reactions   Metformin And Related Diarrhea   Poison Ivy Extract Rash    Allergies as of 02/23/2023       Reactions   Metformin And Related Diarrhea   Poison Ivy Extract Rash        Medication List        Accurate as of February 23, 2023  9:49 AM. If you have any questions, ask your nurse or doctor.          aspirin EC 325 MG tablet Take 1 tablet (325 mg total) by mouth daily.   Calcium Carbonate-Vitamin D 600-400 MG-UNIT tablet Take 1 tablet by mouth daily.   Jardiance 25 MG Tabs tablet Generic drug: empagliflozin TAKE 1 TABLET(25 MG) BY MOUTH DAILY   linagliptin 5 MG Tabs tablet Commonly known as: Tradjenta Take 1 tablet (5 mg total) by mouth daily.   lisinopril-hydrochlorothiazide 20-12.5 MG tablet Commonly known as: ZESTORETIC TAKE 1 TABLET BY MOUTH DAILY   melatonin 3 MG Tabs  tablet Take 1 tablet (3 mg total) by mouth at bedtime as needed.   omeprazole 40 MG capsule Commonly known as: PRILOSEC Take 1 capsule (40 mg total) by mouth daily as needed. Reported on 09/10/2015   rosuvastatin 10 MG tablet Commonly known as: CRESTOR TAKE 1 TABLET BY MOUTH EVERY DAY        Review of Systems  Constitutional:  Negative for appetite change, chills, fatigue, fever and unexpected weight change.  HENT:  Negative for congestion, dental problem, ear discharge, ear pain, facial swelling, hearing loss, nosebleeds, postnasal drip, rhinorrhea, sinus pressure, sinus pain, sneezing, sore throat, tinnitus and trouble swallowing.   Eyes:  Negative for pain, discharge, redness, itching and visual  disturbance.  Respiratory:  Negative for cough, chest tightness, shortness of breath and wheezing.   Cardiovascular:  Negative for chest pain, palpitations and leg swelling.  Gastrointestinal:  Negative for abdominal distention, abdominal pain, blood in stool, constipation, diarrhea, nausea and vomiting.  Endocrine: Negative for cold intolerance, heat intolerance, polydipsia, polyphagia and polyuria.  Genitourinary:  Negative for difficulty urinating, dysuria, flank pain, frequency and urgency.  Musculoskeletal:  Negative for arthralgias, back pain, gait problem, joint swelling, myalgias, neck pain and neck stiffness.  Skin:  Negative for color change, pallor, rash and wound.       Had allergic reaction  Neurological:  Negative for dizziness, syncope, speech difficulty, weakness, light-headedness, numbness and headaches.  Hematological:  Does not bruise/bleed easily.  Psychiatric/Behavioral:  Negative for agitation, behavioral problems, confusion, hallucinations and sleep disturbance. The patient is not nervous/anxious.     Immunization History  Administered Date(s) Administered   Influenza Whole 08/10/2013   Influenza-Unspecified 05/05/2015   PFIZER(Purple Top)SARS-COV-2 Vaccination 09/12/2019, 10/07/2019, 07/18/2020   Pneumococcal Conjugate-13 04/21/2016   Pneumococcal Polysaccharide-23 07/20/2017, 02/05/2022   Tdap 01/01/2015   Pertinent  Health Maintenance Due  Topic Date Due   OPHTHALMOLOGY EXAM  02/14/2022   FOOT EXAM  08/19/2022   INFLUENZA VACCINE  03/05/2023   HEMOGLOBIN A1C  08/12/2023   Colonoscopy  05/13/2025   DEXA SCAN  Completed      02/05/2022   10:13 AM 04/21/2022    9:49 AM 08/13/2022   10:39 AM 09/17/2022    2:34 PM 02/23/2023    8:28 AM  Fall Risk  Falls in the past year? 0 0 0 0 0  Was there an injury with Fall? 0 0 0 0 0  Fall Risk Category Calculator 0 0 0 0 0  Fall Risk Category (Retired) Low Low Low    (RETIRED) Patient Fall Risk Level Low fall risk Low  fall risk Low fall risk    Patient at Risk for Falls Due to No Fall Risks No Fall Risks No Fall Risks No Fall Risks No Fall Risks  Fall risk Follow up Falls evaluation completed Falls evaluation completed Falls evaluation completed Falls evaluation completed Falls evaluation completed   Functional Status Survey:    Vitals:   02/23/23 0930  BP: 118/68  Pulse: 74  Resp: 18  Temp: 98.6 F (37 C)  SpO2: 96%  Weight: 124 lb (56.2 kg)  Height: 5\' 3"  (1.6 m)   Body mass index is 21.97 kg/m. Physical Exam Vitals reviewed.  Constitutional:      General: She is not in acute distress.    Appearance: Normal appearance. She is normal weight. She is not ill-appearing or diaphoretic.  HENT:     Head: Normocephalic.     Right Ear: Tympanic membrane, ear canal and external  ear normal. There is no impacted cerumen.     Left Ear: Tympanic membrane, ear canal and external ear normal. There is no impacted cerumen.     Nose: Nose normal. No congestion or rhinorrhea.     Mouth/Throat:     Mouth: Mucous membranes are moist.     Pharynx: Oropharynx is clear. No oropharyngeal exudate or posterior oropharyngeal erythema.  Eyes:     General: No scleral icterus.       Right eye: No discharge.        Left eye: No discharge.     Extraocular Movements: Extraocular movements intact.     Conjunctiva/sclera: Conjunctivae normal.     Pupils: Pupils are equal, round, and reactive to light.  Neck:     Vascular: No carotid bruit.  Cardiovascular:     Rate and Rhythm: Normal rate and regular rhythm.     Pulses: Normal pulses.     Heart sounds: Normal heart sounds. No murmur heard.    No friction rub. No gallop.  Pulmonary:     Effort: Pulmonary effort is normal. No respiratory distress.     Breath sounds: Normal breath sounds. No wheezing, rhonchi or rales.  Chest:     Chest wall: No tenderness.  Abdominal:     General: Bowel sounds are normal. There is no distension.     Palpations: Abdomen is soft.  There is no mass.     Tenderness: There is no abdominal tenderness. There is no right CVA tenderness, left CVA tenderness, guarding or rebound.  Musculoskeletal:        General: No swelling or tenderness. Normal range of motion.     Cervical back: Normal range of motion. No rigidity or tenderness.     Right lower leg: No edema.     Left lower leg: No edema.  Lymphadenopathy:     Cervical: No cervical adenopathy.  Skin:    General: Skin is warm and dry.     Coloration: Skin is not pale.     Findings: No bruising, erythema, lesion or rash.  Neurological:     Mental Status: She is alert and oriented to person, place, and time.     Cranial Nerves: No cranial nerve deficit.     Sensory: No sensory deficit.     Motor: No weakness.     Coordination: Coordination normal.     Gait: Gait normal.  Psychiatric:        Mood and Affect: Mood normal.        Speech: Speech normal.        Behavior: Behavior normal.        Thought Content: Thought content normal.        Judgment: Judgment normal.     Labs reviewed: Recent Labs    08/06/22 0831 02/09/23 0844  NA 139 138  K 4.2 4.2  CL 105 104  CO2 27 26  GLUCOSE 176* 157*  BUN 20 19  CREATININE 0.76 0.77  CALCIUM 9.0 9.5   Recent Labs    08/06/22 0831 02/09/23 0844  AST 15 13  ALT 20 17  BILITOT 0.3 0.4  PROT 7.3 7.0   Recent Labs    08/06/22 0831 02/09/23 0844  WBC 8.9 8.6  NEUTROABS 3,943 3,965  HGB 12.0 11.8  HCT 35.7 36.1  MCV 89.5 88.7  PLT 382 347   Lab Results  Component Value Date   TSH 1.81 02/09/2023   Lab Results  Component Value Date  HGBA1C 8.2 (H) 02/09/2023   Lab Results  Component Value Date   CHOL 140 02/09/2023   HDL 59 02/09/2023   LDLCALC 58 02/09/2023   TRIG 146 02/09/2023   CHOLHDL 2.4 02/09/2023    Significant Diagnostic Results in last 30 days:  No results found.  Assessment/Plan 1. Essential hypertension Blood pressure well-controlled -Continue on  lisinopril-hydrochlorothiazide - TSH; Future - COMPLETE METABOLIC PANEL WITH GFR; Future - CBC with Differential/Platelet; Future  2. Type 2 diabetes mellitus without complication, without long-term current use of insulin (HCC) Lab Results  Component Value Date   HGBA1C 8.2 (H) 02/09/2023  -Continue on Tradjenta and Jardiance -Follows up with her daughter who is not ophthalmologist.  No records for review but reports no diabetic retinopathy. -Had podiatrist appointment but cancelled.  Monofilament sensation intact this visit -Continue on aspirin and statin for cardiovascular event prevention -Continue on ACE inhibitor for renal protection - TSH; Future - COMPLETE METABOLIC PANEL WITH GFR; Future - CBC with Differential/Platelet; Future - Hemoglobin A1c; Future  3. Hyperlipidemia LDL goal <100 LDL at goal -Continue on rosuvastatin -Continue dietary modification and exercise at least 3 times per week for 30 minutes - Lipid panel; Future  4. Cramps of lower extremity Reports cramping worse at night.Will recheck magnesium level.  Encouraged to increase water intake.  Consider decreasing rosuvastatin if symptoms persist. - Magnesium  5. Rash due to allergy Afebrile -Start on triamcinolone as below.  Continue to monitor if symptoms worsen will refer to dermatologist - triamcinolone cream (KENALOG) 0.1 %; Apply 1 Application topically 2 (two) times daily.  Dispense: 30 g; Refill: 0    Family/ staff Communication: Reviewed plan of care with patient and husband verbalized understanding  Labs/tests ordered:  - TSH; Future - COMPLETE METABOLIC PANEL WITH GFR; Future - CBC with Differential/Platelet; Future - Hemoglobin A1c; Future - Lipid panel; Future  Next Appointment :Return in about 6 months (around 08/26/2023) for medical mangement of chronic issues., Fasting labs in 6 months prior to visit.   Caesar Bookman, NP

## 2023-02-24 LAB — MAGNESIUM: Magnesium: 2.1 mg/dL (ref 1.5–2.5)

## 2023-03-05 ENCOUNTER — Encounter: Payer: Self-pay | Admitting: Family

## 2023-03-05 ENCOUNTER — Ambulatory Visit (INDEPENDENT_AMBULATORY_CARE_PROVIDER_SITE_OTHER): Payer: Medicare HMO | Admitting: Family

## 2023-03-05 VITALS — BP 120/74 | HR 68 | Temp 97.3°F | Resp 18 | Ht 63.0 in | Wt 122.0 lb

## 2023-03-05 DIAGNOSIS — W19XXXA Unspecified fall, initial encounter: Secondary | ICD-10-CM | POA: Diagnosis not present

## 2023-03-05 DIAGNOSIS — S0990XA Unspecified injury of head, initial encounter: Secondary | ICD-10-CM | POA: Diagnosis not present

## 2023-03-05 DIAGNOSIS — M25552 Pain in left hip: Secondary | ICD-10-CM

## 2023-03-05 NOTE — Patient Instructions (Signed)
-   Please get CT scan head at [facility] imaging at [facility] then will call you with results.

## 2023-03-05 NOTE — Progress Notes (Signed)
Provider: Richarda Blade FNP-C  Janelie Goltz, Donalee Citrin, NP  Patient Care Team: Cheronda Erck, Donalee Citrin, NP as PCP - General (Family Medicine) Herschel Senegal, OD (Optometry)  Extended Emergency Contact Information Primary Emergency Contact: Rothrock,Liyana Wille Glaser) Address: [address]          [place] [address] [place] Home Phone: [phone] Relation: Spouse  Code Status:  Full Code  Goals of care: Advanced Directive information    02/23/2023    8:27 AM  Advanced Directives  Does Patient Have a Medical Advance Directive? No  Would patient like information on creating a medical advance directive? No - Patient declined     Chief Complaint  Patient presents with   Acute Visit    Patient is here for a slip and fall with head injury    HPI:  Pt is a 77 y.o. female seen today for an acute visit for evaluation of fall 3 days ago.states was visiting her daughter when she accidentally step on a small plastic paper on the wooden floor slipped and fell on her left side hitting her head on the sharp corner of the wall.she applied ice compressor to head.Her daughter who is an Ophthalmologist checked her eyes and were okay. She complains of pain on left side of the head and left hip bruising.states hip sore but not painful. She denies any headache,dizziness,nausea or vomiting. Also denies any vision change.States was advised by her daughters to get checked up.   She takes Asprin 325 mg tablet every other day.Tends to bruise.     Past Medical History:  Diagnosis Date   Disorder of bone and cartilage, unspecified    Dizziness and giddiness    Hypercalcemia    Hyperlipidemia    Hypertension    Hypopotassemia    Other malaise and fatigue    Other specified disease of white blood cells    Special screening for malignant neoplasms, colon    Type II or unspecified type diabetes mellitus without mention of complication, not stated as uncontrolled    Unspecified vitamin  D deficiency    Past Surgical History:  Procedure Laterality Date   COLONOSCOPY  05/14/15   Dr. Loreta Ave   FRACTURE SURGERY  2001   Tibia & fibula   TONSILLECTOMY  1996    Allergies  Allergen Reactions   Metformin And Related Diarrhea   Poison Ivy Extract Rash    Outpatient Encounter Medications as of 03/05/2023  Medication Sig   aspirin EC 325 MG tablet Take 1 tablet (325 mg total) by mouth daily.   Calcium Carbonate-Vitamin D 600-400 MG-UNIT tablet Take 1 tablet by mouth daily.   JARDIANCE 25 MG TABS tablet TAKE 1 TABLET(25 MG) BY MOUTH DAILY   linagliptin (TRADJENTA) 5 MG TABS tablet Take 1 tablet (5 mg total) by mouth daily.   lisinopril-hydrochlorothiazide (ZESTORETIC) 20-12.5 MG tablet TAKE 1 TABLET BY MOUTH DAILY   omeprazole (PRILOSEC) 40 MG capsule Take 1 capsule (40 mg total) by mouth daily as needed. Reported on 09/10/2015   rosuvastatin (CRESTOR) 10 MG tablet TAKE 1 TABLET BY MOUTH EVERY DAY   triamcinolone cream (KENALOG) 0.1 % Apply 1 Application topically 2 (two) times daily.   No facility-administered encounter medications on file as of 03/05/2023.    Review of Systems  Constitutional:  Negative for appetite change, chills, fatigue and fever.  HENT:  Negative for congestion, dental problem, ear pain, facial swelling, rhinorrhea, sinus pressure, sinus pain, sneezing, sore throat and tinnitus.   Eyes:  Negative for photophobia, pain, discharge, redness, itching and visual disturbance.  Respiratory:  Negative for cough, chest tightness, shortness of breath and wheezing.   Cardiovascular:  Negative for chest pain, palpitations and leg swelling.  Gastrointestinal:  Negative for abdominal distention, abdominal pain, nausea and vomiting.  Musculoskeletal:  Negative for arthralgias, back pain, gait problem, joint swelling, neck pain and neck stiffness.  Skin:  Negative for pallor, rash and wound.       Left hip bruise from fall   Neurological:  Negative for dizziness, seizures,  speech difficulty, weakness, light-headedness, numbness and headaches.    Immunization History  Administered Date(s) Administered   Influenza Whole 08/10/2013   Influenza-Unspecified 05/05/2015   PFIZER(Purple Top)SARS-COV-2 Vaccination 09/12/2019, 10/07/2019, 07/18/2020   Pneumococcal Conjugate-13 04/21/2016   Pneumococcal Polysaccharide-23 07/20/2017, 02/05/2022   Tdap 01/01/2015   Pertinent  Health Maintenance Due  Topic Date Due   OPHTHALMOLOGY EXAM  02/14/2022   INFLUENZA VACCINE  03/05/2023   HEMOGLOBIN A1C  08/12/2023   FOOT EXAM  02/23/2024   Colonoscopy  05/13/2025   DEXA SCAN  Completed      02/05/2022   10:13 AM 04/21/2022    9:49 AM 08/13/2022   10:39 AM 09/17/2022    2:34 PM 02/23/2023    8:28 AM  Fall Risk  Falls in the past year? 0 0 0 0 0  Was there an injury with Fall? 0 0 0 0 0  Fall Risk Category Calculator 0 0 0 0 0  Fall Risk Category (Retired) Low Low Low    (RETIRED) Patient Fall Risk Level Low fall risk Low fall risk Low fall risk    Patient at Risk for Falls Due to No Fall Risks No Fall Risks No Fall Risks No Fall Risks No Fall Risks  Fall risk Follow up Falls evaluation completed Falls evaluation completed Falls evaluation completed Falls evaluation completed Falls evaluation completed   Functional Status Survey:    Vitals:   03/05/23 1054  BP: 120/74  Pulse: 68  Resp: 18  Temp: (!) 97.3 F (36.3 C)  SpO2: 98%  Weight: 122 lb (55.3 kg)  Height: 5\' 3"  (1.6 m)   Body mass index is 21.61 kg/m. Physical Exam Vitals reviewed.  Constitutional:      General: She is not in acute distress.    Appearance: Normal appearance. She is normal weight. She is not ill-appearing or diaphoretic.  HENT:     Head: Normocephalic.  Eyes:     General: No scleral icterus.       Right eye: No discharge.        Left eye: No discharge.     Extraocular Movements: Extraocular movements intact.     Conjunctiva/sclera: Conjunctivae normal.     Pupils: Pupils are  equal, round, and reactive to light.  Neck:     Vascular: No carotid bruit.  Cardiovascular:     Rate and Rhythm: Normal rate and regular rhythm.     Pulses: Normal pulses.     Heart sounds: Normal heart sounds. No murmur heard.    No friction rub. No gallop.  Pulmonary:     Effort: Pulmonary effort is normal. No respiratory distress.     Breath sounds: Normal breath sounds. No wheezing, rhonchi or rales.  Chest:     Chest wall: No tenderness.  Abdominal:     General: Bowel sounds are normal. There is no distension.     Palpations: Abdomen is soft. There is no mass.  Tenderness: There is no abdominal tenderness. There is no right CVA tenderness, left CVA tenderness, guarding or rebound.  Musculoskeletal:        General: No swelling. Normal range of motion.     Cervical back: Normal range of motion. No rigidity or tenderness.     Right hip: Normal.     Left hip: Tenderness present. No crepitus. Normal range of motion. Normal strength.     Right lower leg: No edema.     Left lower leg: No edema.     Comments: Left lateral upper thigh area swollen and bruised slight tender to palpation.  Lymphadenopathy:     Cervical: No cervical adenopathy.  Skin:    General: Skin is warm and dry.     Coloration: Skin is not pale.     Findings: No erythema, lesion or rash.     Comments: Left temporal head tender to palpation.No bruise or swelling noted.   Neurological:     Mental Status: She is alert and oriented to person, place, and time.     Cranial Nerves: No cranial nerve deficit.     Sensory: No sensory deficit.     Motor: No weakness.     Coordination: Coordination normal.     Gait: Gait normal.  Psychiatric:        Mood and Affect: Mood normal.        Speech: Speech normal.        Behavior: Behavior normal.     Labs reviewed: Recent Labs    08/06/22 0831 02/09/23 0844 02/23/23 1028  NA 139 138  --   K 4.2 4.2  --   CL 105 104  --   CO2 27 26  --   GLUCOSE 176* 157*  --    BUN 20 19  --   CREATININE 0.76 0.77  --   CALCIUM 9.0 9.5  --   MG  --   --  2.1   Recent Labs    08/06/22 0831 02/09/23 0844  AST 15 13  ALT 20 17  BILITOT 0.3 0.4  PROT 7.3 7.0   Recent Labs    08/06/22 0831 02/09/23 0844  WBC 8.9 8.6  NEUTROABS 3,943 3,965  HGB 12.0 11.8  HCT 35.7 36.1  MCV 89.5 88.7  PLT 382 347   Lab Results  Component Value Date   TSH 1.81 02/09/2023   Lab Results  Component Value Date   HGBA1C 8.2 (H) 02/09/2023   Lab Results  Component Value Date   CHOL 140 02/09/2023   HDL 59 02/09/2023   LDLCALC 58 02/09/2023   TRIG 146 02/09/2023   CHOLHDL 2.4 02/09/2023    Significant Diagnostic Results in last 30 days:  No results found.  Assessment/Plan 1. Injury due to fall, initial encounter Slipped on a piece of plastic paper at her daughter's house hitting her left side of the head.currently on Asprin. Will obtain scanning to rule out any acute bleeding.  - CT HEAD W & WO CONTRAST ( ); Future  2. Injury of head, initial encounter Larey Seat as above. Left temporal head tender to palpation.No bruise or swelling noted.  - CT HEAD W & WO CONTRAST ( ); Future  3. Acute hip pain, left Left upper thigh area swollen,bruise and slight tender to palpate.No pain with ROM and walks without any difficulties. - no imaging desired - advised to take Tylenol 500 mg tablet one by mouth every 8 hrs as needed for pain  - Notify provider  if symptoms worsen then will obtain X-ray.   Family/ staff Communication: Reviewed plan of care with patient verbalized understanding   Labs/tests ordered:  - CT HEAD W & WO CONTRAST ( ); Future  Next Appointment: Return if symptoms worsen or fail to improve.   Caesar Bookman, NP

## 2023-03-06 ENCOUNTER — Encounter: Payer: Self-pay | Admitting: Family

## 2023-03-10 ENCOUNTER — Telehealth: Payer: Self-pay

## 2023-03-10 ENCOUNTER — Other Ambulatory Visit: Payer: Self-pay | Admitting: Family

## 2023-03-10 DIAGNOSIS — W19XXXA Unspecified fall, initial encounter: Secondary | ICD-10-CM

## 2023-03-10 DIAGNOSIS — S0990XA Unspecified injury of head, initial encounter: Secondary | ICD-10-CM

## 2023-03-10 DIAGNOSIS — E119 Type 2 diabetes mellitus without complications: Secondary | ICD-10-CM

## 2023-03-10 NOTE — Telephone Encounter (Signed)
CT of the head without contrast ordered

## 2023-03-10 NOTE — Telephone Encounter (Signed)
Noted  

## 2023-03-10 NOTE — Telephone Encounter (Signed)
[place] with [facility] called to inform Ngetich, Dinah C, NP that CT Order placed is incorrect and should be for a CT of the Head without contract.  Current order should be deleted and new order placed prior to appointment tomorrow

## 2023-03-11 ENCOUNTER — Other Ambulatory Visit: Payer: Medicare HMO

## 2023-03-11 ENCOUNTER — Ambulatory Visit
Admission: RE | Admit: 2023-03-11 | Discharge: 2023-03-11 | Disposition: A | Discharge status: Home / Self Care | Payer: Medicare HMO | Source: Ambulatory Visit | Attending: Family | Admitting: Family

## 2023-03-11 DIAGNOSIS — W19XXXA Unspecified fall, initial encounter: Secondary | ICD-10-CM

## 2023-03-11 DIAGNOSIS — S0990XA Unspecified injury of head, initial encounter: Secondary | ICD-10-CM | POA: Diagnosis not present

## 2023-05-18 ENCOUNTER — Other Ambulatory Visit: Payer: Self-pay | Admitting: Family

## 2023-05-18 DIAGNOSIS — E119 Type 2 diabetes mellitus without complications: Secondary | ICD-10-CM

## 2023-05-26 ENCOUNTER — Other Ambulatory Visit: Payer: Self-pay

## 2023-05-26 DIAGNOSIS — E785 Hyperlipidemia, unspecified: Secondary | ICD-10-CM

## 2023-05-26 DIAGNOSIS — I10 Essential (primary) hypertension: Secondary | ICD-10-CM

## 2023-05-26 DIAGNOSIS — E119 Type 2 diabetes mellitus without complications: Secondary | ICD-10-CM

## 2023-06-08 ENCOUNTER — Other Ambulatory Visit: Payer: Medicare HMO

## 2023-06-09 ENCOUNTER — Other Ambulatory Visit: Payer: Self-pay | Admitting: Family

## 2023-06-09 DIAGNOSIS — E782 Mixed hyperlipidemia: Secondary | ICD-10-CM

## 2023-06-10 ENCOUNTER — Other Ambulatory Visit: Payer: Medicare HMO

## 2023-06-10 DIAGNOSIS — I10 Essential (primary) hypertension: Secondary | ICD-10-CM | POA: Diagnosis not present

## 2023-06-10 DIAGNOSIS — E119 Type 2 diabetes mellitus without complications: Secondary | ICD-10-CM | POA: Diagnosis not present

## 2023-06-10 DIAGNOSIS — E785 Hyperlipidemia, unspecified: Secondary | ICD-10-CM | POA: Diagnosis not present

## 2023-06-11 ENCOUNTER — Other Ambulatory Visit: Payer: Self-pay | Admitting: Family

## 2023-06-11 DIAGNOSIS — I10 Essential (primary) hypertension: Secondary | ICD-10-CM

## 2023-06-11 LAB — COMPLETE METABOLIC PANEL WITH GFR
AG Ratio: 1.7 (calc) (ref 1.0–2.5)
ALT: 17 U/L (ref 6–29)
AST: 14 U/L (ref 10–35)
Albumin: 4.5 g/dL (ref 3.6–5.1)
Alkaline phosphatase (APISO): 80 U/L (ref 37–153)
BUN: 25 mg/dL (ref 7–25)
CO2: 26 mmol/L (ref 20–32)
Calcium: 9.8 mg/dL (ref 8.6–10.4)
Chloride: 105 mmol/L (ref 98–110)
Creat: 0.71 mg/dL (ref 0.60–1.00)
Globulin: 2.7 g/dL (ref 1.9–3.7)
Glucose, Bld: 144 mg/dL — ABNORMAL HIGH (ref 65–99)
Potassium: 4.4 mmol/L (ref 3.5–5.3)
Sodium: 140 mmol/L (ref 135–146)
Total Bilirubin: 0.4 mg/dL (ref 0.2–1.2)
Total Protein: 7.2 g/dL (ref 6.1–8.1)
eGFR: 89 mL/min/{1.73_m2} (ref >=60)

## 2023-06-11 LAB — LIPID PANEL
Cholesterol: 145 mg/dL (ref <200)
HDL: 65 mg/dL (ref >=50)
LDL Cholesterol (Calc): 59 mg/dL
Non-HDL Cholesterol (Calc): 80 mg/dL (ref <130)
Total CHOL/HDL Ratio: 2.2 (calc) (ref <5.0)
Triglycerides: 124 mg/dL (ref <150)

## 2023-06-11 LAB — HEMOGLOBIN A1C
Hgb A1c MFr Bld: 8.5 %{Hb} — ABNORMAL HIGH (ref <5.7)
Mean Plasma Glucose: 197 mg/dL
eAG (mmol/L): 10.9 mmol/L

## 2023-06-11 LAB — CBC WITH DIFFERENTIAL/PLATELET
Absolute Lymphocytes: 3947 {cells}/uL — ABNORMAL HIGH (ref 850–3900)
Absolute Monocytes: 460 {cells}/uL (ref 200–950)
Basophils Absolute: 46 {cells}/uL (ref 0–200)
Basophils Relative: 0.5 %
Eosinophils Absolute: 147 {cells}/uL (ref 15–500)
Eosinophils Relative: 1.6 %
HCT: 38.3 % (ref 35.0–45.0)
Hemoglobin: 12.4 g/dL (ref 11.7–15.5)
MCH: 29.1 pg (ref 27.0–33.0)
MCHC: 32.4 g/dL (ref 32.0–36.0)
MCV: 89.9 fL (ref 80.0–100.0)
MPV: 8.9 fL (ref 7.5–12.5)
Monocytes Relative: 5 %
Neutro Abs: 4600 {cells}/uL (ref 1500–7800)
Neutrophils Relative %: 50 %
Platelets: 395 10*3/uL (ref 140–400)
RBC: 4.26 10*6/uL (ref 3.80–5.10)
RDW: 12.6 % (ref 11.0–15.0)
Total Lymphocyte: 42.9 %
WBC: 9.2 10*3/uL (ref 3.8–10.8)

## 2023-06-11 LAB — TSH: TSH: 1.42 m[IU]/L (ref 0.40–4.50)

## 2023-06-18 ENCOUNTER — Other Ambulatory Visit: Payer: Self-pay

## 2023-06-18 DIAGNOSIS — E119 Type 2 diabetes mellitus without complications: Secondary | ICD-10-CM

## 2023-06-18 MED ORDER — PIOGLITAZONE HCL 15 MG PO TABS: 15.0000 mg | ORAL_TABLET | Freq: Every day | ORAL | 11 refills | Status: AC

## 2023-06-18 NOTE — Addendum Note (Signed)
Addended by: Maurice Small on: 06/18/2023 09:58 AM   Modules accepted: Orders

## 2023-07-06 ENCOUNTER — Ambulatory Visit: Payer: Medicare HMO | Admitting: Family

## 2023-07-07 ENCOUNTER — Other Ambulatory Visit: Payer: Self-pay | Admitting: Family

## 2023-07-07 DIAGNOSIS — E119 Type 2 diabetes mellitus without complications: Secondary | ICD-10-CM

## 2023-07-14 ENCOUNTER — Encounter: Payer: Self-pay | Admitting: Family

## 2023-07-14 ENCOUNTER — Ambulatory Visit (INDEPENDENT_AMBULATORY_CARE_PROVIDER_SITE_OTHER): Payer: Medicare HMO | Admitting: Family

## 2023-07-14 VITALS — BP 124/70 | HR 70 | Temp 97.7°F | Resp 20 | Ht 63.0 in | Wt 126.6 lb

## 2023-07-14 DIAGNOSIS — E119 Type 2 diabetes mellitus without complications: Secondary | ICD-10-CM

## 2023-07-14 DIAGNOSIS — Z2821 Immunization not carried out because of patient refusal: Secondary | ICD-10-CM | POA: Diagnosis not present

## 2023-07-14 DIAGNOSIS — I10 Essential (primary) hypertension: Secondary | ICD-10-CM | POA: Diagnosis not present

## 2023-07-14 NOTE — Progress Notes (Unsigned)
Location:      Place of Service:    Provider: Jaydrien Wassenaar FNP-C   Ashur Glatfelter, Donalee Citrin, NP  Patient Care Team: Lashane Whelpley, Donalee Citrin, NP as PCP - General (Family Medicine) Herschel [place], OD (Optometry)  Extended Emergency Contact Information Primary Emergency Contact: Hahne,Liyana Wille Glaser) Address: [address]          [place] [address] [place] Home Phone: [phone] Relation: Spouse  Code Status:  *** Goals of care: Advanced Directive information    07/14/2023    2:26 PM  Advanced Directives  Does Patient Have a Medical Advance Directive? No  Would patient like information on creating a medical advance directive? No - Patient declined     Chief Complaint  Patient presents with   Follow-up    Follow up after starting new medication.Discuss shingles ,covid,and flu vaccines.And eye exam medicare annual wellness visit.    HPI:  Pt is a 77 y.o. female seen today for medical management of chronic diseases.   CBC 14  Past Medical History:  Diagnosis Date   Disorder of bone and cartilage, unspecified    Dizziness and giddiness    Hypercalcemia    Hyperlipidemia    Hypertension    Hypopotassemia    Other malaise and fatigue    Other specified disease of white blood cells    Special screening for malignant neoplasms, colon    Type II or unspecified type diabetes mellitus without mention of complication, not stated as uncontrolled    Unspecified vitamin D deficiency    Past Surgical History:  Procedure Laterality Date   COLONOSCOPY  05/14/15   Dr. Loreta Ave   FRACTURE SURGERY  2001   Tibia & fibula   TONSILLECTOMY  1996    Allergies  Allergen Reactions   Metformin And Related Diarrhea   Poison Ivy Extract Rash    Allergies as of 07/14/2023       Reactions   Metformin And Related Diarrhea   Poison Ivy Extract Rash        Medication List        Accurate as of July 14, 2023  3:17 PM. If you have any questions, ask your  nurse or doctor.          aspirin EC 325 MG tablet Take 1 tablet (325 mg total) by mouth daily.   Calcium Carbonate-Vitamin D 600-400 MG-UNIT tablet Take 1 tablet by mouth daily.   Jardiance 25 MG Tabs tablet Generic drug: empagliflozin TAKE 1 TABLET(25 MG) BY MOUTH DAILY   lisinopril-hydrochlorothiazide 20-12.5 MG tablet Commonly known as: ZESTORETIC TAKE 1 TABLET BY MOUTH DAILY   omeprazole 40 MG capsule Commonly known as: PRILOSEC Take 1 capsule (40 mg total) by mouth daily as needed. Reported on 09/10/2015   pioglitazone 15 MG tablet Commonly known as: Actos Take 1 tablet (15 mg total) by mouth daily.   rosuvastatin 10 MG tablet Commonly known as: CRESTOR TAKE 1 TABLET BY MOUTH EVERY DAY   Tradjenta 5 MG Tabs tablet Generic drug: linagliptin TAKE 1 TABLET BY MOUTH EVERY DAY   triamcinolone cream 0.1 % Commonly known as: KENALOG Apply 1 Application topically 2 (two) times daily.        Review of Systems  Immunization History  Administered Date(s) Administered   Influenza Whole 08/10/2013   Influenza-Unspecified 05/05/2015   PFIZER(Purple Top)SARS-COV-2 Vaccination 09/12/2019, 10/07/2019, 07/18/2020   Pneumococcal Conjugate-13 04/21/2016   Pneumococcal Polysaccharide-23 07/20/2017, 02/05/2022   Tdap 01/01/2015   Pertinent  Health Maintenance  Due  Topic Date Due   OPHTHALMOLOGY EXAM  09/11/2020   INFLUENZA VACCINE  03/05/2023   HEMOGLOBIN A1C  12/08/2023   FOOT EXAM  02/23/2024   Colonoscopy  05/13/2025   DEXA SCAN  Completed      04/21/2022    9:49 AM 08/13/2022   10:39 AM 09/17/2022    2:34 PM 02/23/2023    8:28 AM 07/14/2023    2:24 PM  Fall Risk  Falls in the past year? 0 0 0 0 0  Was there an injury with Fall? 0 0 0 0 0  Fall Risk Category Calculator 0 0 0 0 0  Fall Risk Category (Retired) Low Low     (RETIRED) Patient Fall Risk Level Low fall risk Low fall risk     Patient at Risk for Falls Due to No Fall Risks No Fall Risks No Fall Risks  No Fall Risks   Fall risk Follow up Falls evaluation completed Falls evaluation completed Falls evaluation completed Falls evaluation completed    Functional Status Survey:    Vitals:   07/14/23 1433  BP: 124/70  Pulse: 70  Resp: 20  Temp: 97.7 F (36.5 C)  SpO2: 96%  Weight: 126 lb 9.6 oz (57.4 kg)  Height: 5\' 3"  (1.6 m)   Body mass index is 22.43 kg/m. Physical Exam  Labs reviewed: Recent Labs    08/06/22 0831 02/09/23 0844 02/23/23 1028 06/10/23 0856  NA 139 138  --  140  K 4.2 4.2  --  4.4  CL 105 104  --  105  CO2 27 26  --  26  GLUCOSE 176* 157*  --  144*  BUN 20 19  --  25  CREATININE 0.76 0.77  --  0.71  CALCIUM 9.0 9.5  --  9.8  MG  --   --  2.1  --    Recent Labs    08/06/22 0831 02/09/23 0844 06/10/23 0856  AST 15 13 14   ALT 20 17 17   BILITOT 0.3 0.4 0.4  PROT 7.3 7.0 7.2   Recent Labs    08/06/22 0831 02/09/23 0844 06/10/23 0856  WBC 8.9 8.6 9.2  NEUTROABS 3,943 3,965 4,600  HGB 12.0 11.8 12.4  HCT 35.7 36.1 38.3  MCV 89.5 88.7 89.9  PLT 382 347 395   Lab Results  Component Value Date   TSH 1.42 06/10/2023   Lab Results  Component Value Date   HGBA1C 8.5 (H) 06/10/2023   Lab Results  Component Value Date   CHOL 145 06/10/2023   HDL 65 06/10/2023   LDLCALC 59 06/10/2023   TRIG 124 06/10/2023   CHOLHDL 2.2 06/10/2023    Significant Diagnostic Results in last 30 days:  No results found.  Assessment/Plan There are no diagnoses linked to this encounter.   Family/ staff Communication: Reviewed plan of care with patient  Labs/tests ordered: None   Next Appointment :   Caesar Bookman, NP

## 2023-07-22 DIAGNOSIS — M5416 Radiculopathy, lumbar region: Secondary | ICD-10-CM | POA: Diagnosis not present

## 2023-08-26 ENCOUNTER — Other Ambulatory Visit: Payer: Medicare HMO

## 2023-08-31 ENCOUNTER — Ambulatory Visit: Payer: Medicare HMO | Admitting: Family

## 2023-09-21 ENCOUNTER — Other Ambulatory Visit: Payer: Self-pay | Admitting: Family

## 2023-09-21 DIAGNOSIS — E119 Type 2 diabetes mellitus without complications: Secondary | ICD-10-CM

## 2023-09-22 ENCOUNTER — Other Ambulatory Visit: Payer: Self-pay | Admitting: Family

## 2023-09-22 DIAGNOSIS — E119 Type 2 diabetes mellitus without complications: Secondary | ICD-10-CM

## 2023-11-09 ENCOUNTER — Other Ambulatory Visit: Payer: Medicare HMO

## 2023-11-09 DIAGNOSIS — I10 Essential (primary) hypertension: Secondary | ICD-10-CM

## 2023-11-09 DIAGNOSIS — E119 Type 2 diabetes mellitus without complications: Secondary | ICD-10-CM

## 2023-11-11 ENCOUNTER — Other Ambulatory Visit

## 2023-11-11 DIAGNOSIS — I10 Essential (primary) hypertension: Secondary | ICD-10-CM | POA: Diagnosis not present

## 2023-11-11 DIAGNOSIS — E119 Type 2 diabetes mellitus without complications: Secondary | ICD-10-CM | POA: Diagnosis not present

## 2023-11-12 ENCOUNTER — Ambulatory Visit: Payer: Medicare HMO | Admitting: Family

## 2023-11-12 LAB — CBC WITH DIFFERENTIAL/PLATELET
Absolute Lymphocytes: 3995 {cells}/uL — ABNORMAL HIGH (ref 850–3900)
Absolute Monocytes: 555 {cells}/uL (ref 200–950)
Basophils Absolute: 64 {cells}/uL (ref 0–200)
Basophils Relative: 0.7 %
Eosinophils Absolute: 137 {cells}/uL (ref 15–500)
Eosinophils Relative: 1.5 %
HCT: 36.3 % (ref 35.0–45.0)
Hemoglobin: 12 g/dL (ref 11.7–15.5)
MCH: 29.9 pg (ref 27.0–33.0)
MCHC: 33.1 g/dL (ref 32.0–36.0)
MCV: 90.3 fL (ref 80.0–100.0)
MPV: 9 fL (ref 7.5–12.5)
Monocytes Relative: 6.1 %
Neutro Abs: 4350 {cells}/uL (ref 1500–7800)
Neutrophils Relative %: 47.8 %
Platelets: 371 10*3/uL (ref 140–400)
RBC: 4.02 10*6/uL (ref 3.80–5.10)
RDW: 12.6 % (ref 11.0–15.0)
Total Lymphocyte: 43.9 %
WBC: 9.1 10*3/uL (ref 3.8–10.8)

## 2023-11-12 LAB — COMPLETE METABOLIC PANEL WITHOUT GFR
AG Ratio: 1.6 (calc) (ref 1.0–2.5)
ALT: 21 U/L (ref 6–29)
AST: 16 U/L (ref 10–35)
Albumin: 4.4 g/dL (ref 3.6–5.1)
Alkaline phosphatase (APISO): 83 U/L (ref 37–153)
BUN: 20 mg/dL (ref 7–25)
CO2: 29 mmol/L (ref 20–32)
Calcium: 9.4 mg/dL (ref 8.6–10.4)
Chloride: 105 mmol/L (ref 98–110)
Creat: 0.7 mg/dL (ref 0.60–1.00)
Globulin: 2.7 g/dL (ref 1.9–3.7)
Glucose, Bld: 165 mg/dL — ABNORMAL HIGH (ref 65–99)
Potassium: 4.1 mmol/L (ref 3.5–5.3)
Sodium: 141 mmol/L (ref 135–146)
Total Bilirubin: 0.3 mg/dL (ref 0.2–1.2)
Total Protein: 7.1 g/dL (ref 6.1–8.1)

## 2023-11-12 LAB — LIPID PANEL
Cholesterol: 139 mg/dL (ref <200)
HDL: 59 mg/dL (ref >=50)
LDL Cholesterol (Calc): 61 mg/dL
Non-HDL Cholesterol (Calc): 80 mg/dL (ref <130)
Total CHOL/HDL Ratio: 2.4 (calc) (ref <5.0)
Triglycerides: 102 mg/dL (ref <150)

## 2023-11-12 LAB — HEMOGLOBIN A1C
Hgb A1c MFr Bld: 8.9 %{Hb} — ABNORMAL HIGH (ref <5.7)
Mean Plasma Glucose: 209 mg/dL
eAG (mmol/L): 11.6 mmol/L

## 2023-11-12 LAB — TSH: TSH: 1.5 m[IU]/L (ref 0.40–4.50)

## 2023-11-16 ENCOUNTER — Encounter: Payer: Self-pay | Admitting: Family

## 2023-11-16 ENCOUNTER — Ambulatory Visit: Admitting: Family

## 2023-11-16 VITALS — BP 122/76 | HR 69 | Temp 97.6°F | Resp 20 | Ht 63.0 in | Wt 124.6 lb

## 2023-11-16 DIAGNOSIS — K219 Gastro-esophageal reflux disease without esophagitis: Secondary | ICD-10-CM

## 2023-11-16 DIAGNOSIS — E119 Type 2 diabetes mellitus without complications: Secondary | ICD-10-CM | POA: Diagnosis not present

## 2023-11-16 DIAGNOSIS — E785 Hyperlipidemia, unspecified: Secondary | ICD-10-CM

## 2023-11-16 DIAGNOSIS — I10 Essential (primary) hypertension: Secondary | ICD-10-CM

## 2023-11-17 LAB — MICROALBUMIN / CREATININE URINE RATIO
Creatinine, Urine: 24 mg/dL (ref 20–275)
Microalb, Ur: <0.2 mg/dL

## 2023-11-22 DIAGNOSIS — K219 Gastro-esophageal reflux disease without esophagitis: Secondary | ICD-10-CM | POA: Insufficient documentation

## 2023-11-22 NOTE — Progress Notes (Signed)
 Provider: Christean Courts FNP-C   Zada Haser, Elijio Guadeloupe, NP  Patient Care Team: Krystle Polcyn, Elijio Guadeloupe, NP as PCP - General (Family Medicine) Phineas Breech, OD (Optometry)  Extended Emergency Contact Information Primary Emergency Contact: Gorelick,Liyana Kelton Patron) Address: [address]          [place] [address] [place] Home Phone: [phone] Relation: Spouse  Code Status:  Full Code  Goals of care: Advanced Directive information    11/16/2023   11:32 AM  Advanced Directives  Does Patient Have a Medical Advance Directive? No  Would patient like information on creating a medical advance directive? No - Patient declined     Chief Complaint  Patient presents with   Medical Management of Chronic Issues    4 month follow up.    Discussed the use of AI scribe software for clinical note transcription with the patient, who gave verbal consent to proceed.  History of Present Illness   Joann Kennedy is a 78 year old female with type 2 diabetes who presents for follow-up of her diabetes management.Here with Husband who provides additional HPI information.  Recent lab work shows an increase in her HbA1c from 8.5% to 8.9%, and her glucose level was 165 mg/dL. At home, fasting blood sugar readings are around 140 mg/dL in the mornings. She has been actively managing her diet by reducing rice intake to once a day and incorporating more brown rice. However, she consumes sweets such as ginger snaps and ice cream occasionally.  She is currently taking Jandias and Tradjenta  for her diabetes. Her blood pressure is managed with lisinopril  and hydrochlorothiazide , and she also takes aspirin . She occasionally uses omeprazole  for acid reflux and triamcinolone  cream for itching as needed. She takes calcium  and vitamin D  daily.  She experiences headaches in the morning, particularly in the forehead, lasting about an hour and sometimes accompanied by dizziness. She attributes these  to stress and notes improvement with massage. She does not regularly check her blood pressure at home but believes it is well-controlled.  She has a history of knee issues on the right side due to a plate in the bone, but reports no numbness or tingling in her feet. She is active, often working in the yard, and has lost weight since her last visit, now weighing 124.6 pounds.    Past Medical History:  Diagnosis Date   Disorder of bone and cartilage, unspecified    Dizziness and giddiness    Hypercalcemia    Hyperlipidemia    Hypertension    Hypopotassemia    Other malaise and fatigue    Other specified disease of white blood cells    Special screening for malignant neoplasms, colon    Type II or unspecified type diabetes mellitus without mention of complication, not stated as uncontrolled    Unspecified vitamin D  deficiency    Past Surgical History:  Procedure Laterality Date   COLONOSCOPY  05/14/15   Dr. Tova Fresh   FRACTURE SURGERY  2001   Tibia & fibula   TONSILLECTOMY  1996    Allergies  Allergen Reactions   Metformin  And Related Diarrhea   Poison Ivy Extract Rash    Allergies as of 11/16/2023       Reactions   Metformin  And Related Diarrhea   Poison Ivy Extract Rash        Medication List        Accurate as of November 16, 2023 11:59 PM. If you have any questions, ask your nurse  or doctor.          aspirin  EC 325 MG tablet Take 1 tablet (325 mg total) by mouth daily.   Calcium  Carbonate-Vitamin D  600-400 MG-UNIT tablet Take 1 tablet by mouth daily.   Jardiance  25 MG Tabs tablet Generic drug: empagliflozin  TAKE 1 TABLET(25 MG) BY MOUTH DAILY   lisinopril -hydrochlorothiazide  20-12.5 MG tablet Commonly known as: ZESTORETIC  TAKE 1 TABLET BY MOUTH DAILY   omeprazole  40 MG capsule Commonly known as: PRILOSEC Take 1 capsule (40 mg total) by mouth daily as needed. Reported on 09/10/2015   pioglitazone  15 MG tablet Commonly known as: Actos  Take 1 tablet (15 mg  total) by mouth daily.   rosuvastatin  10 MG tablet Commonly known as: CRESTOR  TAKE 1 TABLET BY MOUTH EVERY DAY   Tradjenta  5 MG Tabs tablet Generic drug: linagliptin  TAKE 1 TABLET BY MOUTH EVERY DAY   triamcinolone  cream 0.1 % Commonly known as: KENALOG  Apply 1 Application topically 2 (two) times daily.        Review of Systems  Constitutional:  Negative for appetite change, chills, fatigue, fever and unexpected weight change.  HENT:  Negative for congestion, dental problem, ear discharge, ear pain, facial swelling, hearing loss, nosebleeds, postnasal drip, rhinorrhea, sinus pressure, sinus pain, sneezing, sore throat, tinnitus and trouble swallowing.   Eyes:  Negative for pain, discharge, redness, itching and visual disturbance.  Respiratory:  Negative for cough, chest tightness, shortness of breath and wheezing.   Cardiovascular:  Negative for chest pain, palpitations and leg swelling.  Gastrointestinal:  Negative for abdominal distention, abdominal pain, blood in stool, constipation, diarrhea, nausea and vomiting.       Acid reflux occasionally   Endocrine: Negative for cold intolerance, heat intolerance, polydipsia, polyphagia and polyuria.  Genitourinary:  Negative for difficulty urinating, dysuria, flank pain, frequency and urgency.  Musculoskeletal:  Positive for arthralgias. Negative for back pain, gait problem, joint swelling, myalgias, neck pain and neck stiffness.       Knee pain   Skin:  Negative for color change, pallor, rash and wound.  Neurological:  Negative for dizziness, syncope, speech difficulty, weakness, light-headedness and numbness.       H/A in the mornings   Hematological:  Does not bruise/bleed easily.  Psychiatric/Behavioral:  Negative for agitation, behavioral problems, confusion, hallucinations, self-injury, sleep disturbance and suicidal ideas. The patient is not nervous/anxious.     Immunization History  Administered Date(s) Administered    Influenza Whole 08/10/2013   Influenza-Unspecified 05/05/2015   PFIZER(Purple Top)SARS-COV-2 Vaccination 09/12/2019, 10/07/2019, 07/18/2020   Pneumococcal Conjugate-13 04/21/2016   Pneumococcal Polysaccharide-23 07/20/2017, 02/05/2022   Tdap 01/01/2015   Pertinent  Health Maintenance Due  Topic Date Due   FOOT EXAM  02/23/2024   INFLUENZA VACCINE  03/04/2024   HEMOGLOBIN A1C  05/12/2024   OPHTHALMOLOGY EXAM  08/17/2024   DEXA SCAN  Completed   Colonoscopy  Discontinued      08/13/2022   10:39 AM 09/17/2022    2:34 PM 02/23/2023    8:28 AM 07/14/2023    2:24 PM 11/16/2023   11:32 AM  Fall Risk  Falls in the past year? 0 0 0 0 0  Was there an injury with Fall? 0 0 0 0 0  Fall Risk Category Calculator 0 0 0 0 0  Fall Risk Category (Retired) Low      (RETIRED) Patient Fall Risk Level Low fall risk      Patient at Risk for Falls Due to No Fall Risks No Fall Risks  No Fall Risks  No Fall Risks  Fall risk Follow up Falls evaluation completed Falls evaluation completed Falls evaluation completed  Falls evaluation completed   Functional Status Survey:    Vitals:   11/16/23 1134  BP: 122/76  Pulse: 69  Resp: 20  Temp: 97.6 F (36.4 C)  SpO2: 98%  Weight: 124 lb 9.6 oz (56.5 kg)  Height: 5\' 3"  (1.6 m)   Body mass index is 22.07 kg/m. Physical Exam VITALS: T- 97.6, P- 67, BP- 122/76, SaO2- 98% MEASUREMENTS: Weight- 124.6. GENERAL: Alert, cooperative, well developed, no acute distress. HEENT: Normocephalic, normal oropharynx, moist mucous membranes, ears normal bilaterally. NECK: Neck normal. CHEST: Clear to auscultation bilaterally, no wheezes, rhonchi, or crackles. CARDIOVASCULAR: Normal heart rate and rhythm, S1 and S2 normal without murmurs. ABDOMEN: Soft, non-tender, non-distended, without organomegaly, normal bowel sounds. EXTREMITIES: No cyanosis or edema, good pulses. NEUROLOGICAL: Cranial nerves grossly intact, moves all extremities without gross motor or sensory  deficit, sensation intact.  SKIN: No rash,no lesion or erythema   PSYCHIATRY/BEHAVIORAL: Mood stable    Labs reviewed: Recent Labs    02/09/23 0844 02/23/23 1028 06/10/23 0856 11/11/23 0840  NA 138  --  140 141  K 4.2  --  4.4 4.1  CL 104  --  105 105  CO2 26  --  26 29  GLUCOSE 157*  --  144* 165*  BUN 19  --  25 20  CREATININE 0.77  --  0.71 0.70  CALCIUM  9.5  --  9.8 9.4  MG  --  2.1  --   --    Recent Labs    02/09/23 0844 06/10/23 0856 11/11/23 0840  AST 13 14 16   ALT 17 17 21   BILITOT 0.4 0.4 0.3  PROT 7.0 7.2 7.1   Recent Labs    02/09/23 0844 06/10/23 0856 11/11/23 0840  WBC 8.6 9.2 9.1  NEUTROABS 3,965 4,600 4,350  HGB 11.8 12.4 12.0  HCT 36.1 38.3 36.3  MCV 88.7 89.9 90.3  PLT 347 395 371   Lab Results  Component Value Date   TSH 1.50 11/11/2023   Lab Results  Component Value Date   HGBA1C 8.9 (H) 11/11/2023   Lab Results  Component Value Date   CHOL 139 11/11/2023   HDL 59 11/11/2023   LDLCALC 61 11/11/2023   TRIG 102 11/11/2023   CHOLHDL 2.4 11/11/2023    Significant Diagnostic Results in last 30 days:  No results found.  Assessment/Plan  Type 2 Diabetes Mellitus HbA1c increased from 8.5% to 8.9%, indicating suboptimal glycemic control. Fasting glucose levels at home are around 140 mg/dL. She admits to consuming sweets, including ginger snaps and ice cream, contributing to elevated blood sugar levels. The goal is to reduce HbA1c to below 8%. - Encourage dietary modifications to reduce sugar intake, including limiting sweets and portion control of fruits. - Continue current medications: Jandias and Tradjenta . - Monitor blood glucose levels regularly at home. - Encourage physical activity to aid in glycemic control.  Hypertension Blood pressure is well-controlled at 122/76 mmHg. She is on lisinopril  and hydrochlorothiazide . She expresses a desire to reduce medication intake and questions the necessity of continued antihypertensive  therapy. She will monitor blood pressure at home to assess the need for medication adjustment. - Instruct her to monitor blood pressure at home twice daily for two weeks and record the readings. - Consider reducing antihypertensive medication based on home blood pressure readings. - Encourage lifestyle modifications, including staying active and reducing  dietary salt intake.  Gastroesophageal Reflux Disease (GERD) She uses omeprazole  (Prilosec) as needed for acid reflux symptoms. She is considering trying apple cider vinegar tablets for diabetes control but is cautioned about potential increased acidity. - Continue omeprazole  as needed for acid reflux symptoms. - Advise caution with apple cider vinegar tablets due to potential for increased acidity and interaction with GERD.  General Health Maintenance She is up to date on immunizations except for the COVID-19 vaccine and shingles vaccine. She is hesitant about receiving these vaccines. She has not had a dental cleaning recently due to cost concerns. - Recommend receiving the shingles vaccine, especially given chickenpox history. - Discuss the importance of regular dental cleanings and explore affordable options.   Family/ staff Communication: Reviewed plan of care with patient  and Husband verbalized understanding   Labs/tests ordered:  - CBC with Differential/Platelet - CMP with eGFR([facility]) - TSH - Hgb A1C - Lipid panel - Urine microalbumin Creatinine ratio   Next Appointment : Return in about 6 months (around 05/17/2024) for medical mangement of chronic issues., Fasting labs in 6 months prior to visit. 2 weeks for B/p.   Spent 32 minutes of Face to face and non-face to face with patient  >50% time spent counseling; reviewing medical record; tests; labs; documentation and developing future plan of care.   Estil Heman, NP

## 2023-12-01 ENCOUNTER — Ambulatory Visit: Admitting: Family

## 2023-12-16 ENCOUNTER — Other Ambulatory Visit: Payer: Self-pay | Admitting: Family

## 2023-12-16 DIAGNOSIS — I10 Essential (primary) hypertension: Secondary | ICD-10-CM

## 2023-12-17 ENCOUNTER — Other Ambulatory Visit: Payer: Self-pay | Admitting: Family

## 2023-12-17 DIAGNOSIS — E782 Mixed hyperlipidemia: Secondary | ICD-10-CM

## 2023-12-30 ENCOUNTER — Other Ambulatory Visit: Payer: Self-pay | Admitting: Family

## 2023-12-30 DIAGNOSIS — E119 Type 2 diabetes mellitus without complications: Secondary | ICD-10-CM

## 2024-03-02 ENCOUNTER — Other Ambulatory Visit: Payer: Self-pay | Admitting: Family

## 2024-03-02 DIAGNOSIS — E119 Type 2 diabetes mellitus without complications: Secondary | ICD-10-CM

## 2024-05-09 ENCOUNTER — Other Ambulatory Visit

## 2024-05-09 DIAGNOSIS — I10 Essential (primary) hypertension: Secondary | ICD-10-CM

## 2024-05-09 DIAGNOSIS — E119 Type 2 diabetes mellitus without complications: Secondary | ICD-10-CM

## 2024-05-09 DIAGNOSIS — E785 Hyperlipidemia, unspecified: Secondary | ICD-10-CM | POA: Diagnosis not present

## 2024-05-10 LAB — CBC WITH DIFFERENTIAL/PLATELET
Absolute Lymphocytes: 4024 {cells}/uL — ABNORMAL HIGH (ref 850–3900)
Absolute Monocytes: 487 {cells}/uL (ref 200–950)
Basophils Absolute: 59 {cells}/uL (ref 0–200)
Basophils Relative: 0.7 %
Eosinophils Absolute: 193 {cells}/uL (ref 15–500)
Eosinophils Relative: 2.3 %
HCT: 37.6 % (ref 35.0–45.0)
Hemoglobin: 12.1 g/dL (ref 11.7–15.5)
MCH: 29.5 pg (ref 27.0–33.0)
MCHC: 32.2 g/dL (ref 32.0–36.0)
MCV: 91.7 fL (ref 80.0–100.0)
MPV: 9.2 fL (ref 7.5–12.5)
Monocytes Relative: 5.8 %
Neutro Abs: 3637 {cells}/uL (ref 1500–7800)
Neutrophils Relative %: 43.3 %
Platelets: 334 Thousand/uL (ref 140–400)
RBC: 4.1 Million/uL (ref 3.80–5.10)
RDW: 13 % (ref 11.0–15.0)
Total Lymphocyte: 47.9 %
WBC: 8.4 Thousand/uL (ref 3.8–10.8)

## 2024-05-10 LAB — COMPLETE METABOLIC PANEL WITHOUT GFR
AG Ratio: 1.6 (calc) (ref 1.0–2.5)
ALT: 18 U/L (ref 6–29)
AST: 15 U/L (ref 10–35)
Albumin: 4.5 g/dL (ref 3.6–5.1)
Alkaline phosphatase (APISO): 77 U/L (ref 37–153)
BUN: 17 mg/dL (ref 7–25)
CO2: 26 mmol/L (ref 20–32)
Calcium: 9.8 mg/dL (ref 8.6–10.4)
Chloride: 102 mmol/L (ref 98–110)
Creat: 0.69 mg/dL (ref 0.60–1.00)
Globulin: 2.9 g/dL (ref 1.9–3.7)
Glucose, Bld: 135 mg/dL — ABNORMAL HIGH (ref 65–99)
Potassium: 3.8 mmol/L (ref 3.5–5.3)
Sodium: 137 mmol/L (ref 135–146)
Total Bilirubin: 0.5 mg/dL (ref 0.2–1.2)
Total Protein: 7.4 g/dL (ref 6.1–8.1)

## 2024-05-10 LAB — LIPID PANEL
Cholesterol: 140 mg/dL (ref <200)
HDL: 64 mg/dL (ref >=50)
LDL Cholesterol (Calc): 52 mg/dL
Non-HDL Cholesterol (Calc): 76 mg/dL (ref <130)
Total CHOL/HDL Ratio: 2.2 (calc) (ref <5.0)
Triglycerides: 160 mg/dL — ABNORMAL HIGH (ref <150)

## 2024-05-10 LAB — TSH: TSH: 1.56 m[IU]/L (ref 0.40–4.50)

## 2024-05-10 LAB — HEMOGLOBIN A1C
Hgb A1c MFr Bld: 8.2 % — ABNORMAL HIGH (ref <5.7)
Mean Plasma Glucose: 189 mg/dL
eAG (mmol/L): 10.4 mmol/L

## 2024-05-11 ENCOUNTER — Ambulatory Visit: Payer: Self-pay | Admitting: Family

## 2024-05-17 ENCOUNTER — Encounter: Payer: Self-pay | Admitting: Family

## 2024-05-17 ENCOUNTER — Ambulatory Visit: Admitting: Family

## 2024-05-17 DIAGNOSIS — I10 Essential (primary) hypertension: Secondary | ICD-10-CM

## 2024-05-17 DIAGNOSIS — R194 Change in bowel habit: Secondary | ICD-10-CM | POA: Insufficient documentation

## 2024-05-17 DIAGNOSIS — R1310 Dysphagia, unspecified: Secondary | ICD-10-CM | POA: Insufficient documentation

## 2024-05-17 DIAGNOSIS — E782 Mixed hyperlipidemia: Secondary | ICD-10-CM

## 2024-05-17 DIAGNOSIS — E119 Type 2 diabetes mellitus without complications: Secondary | ICD-10-CM

## 2024-05-17 DIAGNOSIS — D509 Iron deficiency anemia, unspecified: Secondary | ICD-10-CM | POA: Insufficient documentation

## 2024-05-17 MED ORDER — ROSUVASTATIN CALCIUM 10 MG PO TABS: 10.0000 mg | ORAL_TABLET | Freq: Every day | ORAL | 1 refills | Status: AC

## 2024-05-17 MED ORDER — LINAGLIPTIN 5 MG PO TABS: 5.0000 mg | ORAL_TABLET | Freq: Every day | ORAL | 1 refills | Status: AC

## 2024-05-17 MED ORDER — EMPAGLIFLOZIN 25 MG PO TABS: 25.0000 mg | ORAL_TABLET | Freq: Every day | ORAL | 1 refills | Status: AC

## 2024-05-17 MED ORDER — LISINOPRIL-HYDROCHLOROTHIAZIDE 20-12.5 MG PO TABS: 1.0000 | ORAL_TABLET | Freq: Every day | ORAL | 1 refills | Status: AC

## 2024-05-17 NOTE — Patient Instructions (Signed)
 1.Stop at check out and schedule Annual Wellness Visit.

## 2024-05-25 NOTE — Progress Notes (Signed)
 Provider: Roxan Plough FNP-C   Divinity Kyler, Roxan BROCKS, NP  Patient Care Team: Myrical Andujo, Roxan BROCKS, NP as PCP - General (Family Medicine) Carletha Hussar, OD (Optometry)  Extended Emergency Contact Information Primary Emergency Contact: Polo,Liyana Jobie) Address: [address]          [place] [address] [place] Home Phone: [phone] Relation: Spouse  Code Status:  Full Code  Goals of care: Advanced Directive information    05/17/2024    9:23 AM  Advanced Directives  Does Patient Have a Medical Advance Directive? No  Would patient like information on creating a medical advance directive? No - Patient declined     Chief Complaint  Patient presents with   Medical Management of Chronic Issues    6 Month follow up.    Discussed the use of AI scribe software for clinical note transcription with the patient, who gave verbal consent to proceed.  History of Present Illness   Joann Kennedy is a 78 year old female with diabetes and hyperlipidemia who presents for a six-month follow-up appointment.  Her blood sugars have been fluctuating, described as 'going up and down'. Recent lab work from May 09, 2024, shows her A1c has decreased from 8.9% six months ago to 8.2%. Her glucose level changed from 165 mg/dL to 864 mg/dL. She is currently taking Actos  15 mg daily and Tradjenta  5 mg daily for diabetes management.  She experiences occasional chest pain that 'comes and goes', located more on the side rather than the chest itself. The pain is described as 'just aching' and not associated with deep breaths or lifting. No palpitations or constant chest pain.  Her total cholesterol changed from 139 mg/dL to 859 mg/dL, with HDL increasing to 64 mg/dL. Her triglycerides are 160 mg/dL. She is on rosuvastatin  10 mg daily.  No issues with her blood pressure at home and she is currently taking lisinopril  20 mg and hydrochlorothiazide  12.5 mg. She also takes aspirin   325 mg as needed, approximately five days a week.  She experiences knee pain, particularly in the right leg, which worsens in cold weather due to a metal plate from a previous surgery. The pain is more pronounced in winter.  No numbness or tingling in her hands or feet and no issues with bowel movements, though she occasionally experiences diarrhea after eating certain foods. Anxiety and depression 'come and go' with age but are manageable without medication.  She reports itchy ears and feels they are dry, requiring frequent cleaning. No issues with allergies or sinus tenderness.  She is mostly at home as her grandchildren are in school, allowing her more free time. She recently took a trip to [place]  and her family visited [place] for three weeks.   Past Medical History:  Diagnosis Date   Disorder of bone and cartilage, unspecified    Dizziness and giddiness    Hypercalcemia    Hyperlipidemia    Hypertension    Hypopotassemia    Other malaise and fatigue    Other specified disease of white blood cells    Special screening for malignant neoplasms, colon    Type II or unspecified type diabetes mellitus without mention of complication, not stated as uncontrolled    Unspecified vitamin D  deficiency    Past Surgical History:  Procedure Laterality Date   COLONOSCOPY  05/14/15   Dr. Kristie   FRACTURE SURGERY  2001   Tibia & fibula   TONSILLECTOMY  1996    Allergies  Allergen Reactions  Metformin  And Related Diarrhea   Poison Ivy Extract Rash    Allergies as of 05/17/2024       Reactions   Metformin  And Related Diarrhea   Poison Ivy Extract Rash        Medication List        Accurate as of May 17, 2024 11:59 PM. If you have any questions, ask your nurse or doctor.          aspirin  EC 325 MG tablet Take 1 tablet (325 mg total) by mouth daily. What changed:  when to take this reasons to take this   Calcium  Carbonate-Vitamin D  600-400 MG-UNIT tablet Take 1  tablet by mouth daily.   empagliflozin  25 MG Tabs tablet Commonly known as: Jardiance  Take 1 tablet (25 mg total) by mouth daily. What changed: See the new instructions. Changed by: Julez Huseby C Lindzey Zent   linagliptin  5 MG Tabs tablet Commonly known as: Tradjenta  Take 1 tablet (5 mg total) by mouth daily.   lisinopril -hydrochlorothiazide  20-12.5 MG tablet Commonly known as: ZESTORETIC  Take 1 tablet by mouth daily.   omeprazole  40 MG capsule Commonly known as: PRILOSEC Take 1 capsule (40 mg total) by mouth daily as needed. Reported on 09/10/2015   pioglitazone  15 MG tablet Commonly known as: Actos  Take 1 tablet (15 mg total) by mouth daily.   rosuvastatin  10 MG tablet Commonly known as: CRESTOR  Take 1 tablet (10 mg total) by mouth daily.   triamcinolone  cream 0.1 % Commonly known as: KENALOG  Apply 1 Application topically 2 (two) times daily.        Review of Systems  Constitutional:  Negative for appetite change, chills, fatigue, fever and unexpected weight change.  HENT:  Negative for congestion, dental problem, ear discharge, ear pain, facial swelling, hearing loss, nosebleeds, postnasal drip, rhinorrhea, sinus pressure, sinus pain, sneezing, sore throat, tinnitus and trouble swallowing.   Eyes:  Negative for pain, discharge, redness, itching and visual disturbance.  Respiratory:  Negative for cough, chest tightness, shortness of breath and wheezing.   Cardiovascular:  Negative for chest pain, palpitations and leg swelling.  Gastrointestinal:  Negative for abdominal distention, abdominal pain, blood in stool, constipation, diarrhea, nausea and vomiting.  Endocrine: Negative for cold intolerance, heat intolerance, polydipsia, polyphagia and polyuria.  Genitourinary:  Negative for difficulty urinating, dysuria, flank pain, frequency and urgency.  Musculoskeletal:  Positive for arthralgias. Negative for back pain, gait problem, joint swelling, myalgias, neck pain and neck  stiffness.  Skin:  Negative for color change, pallor, rash and wound.  Neurological:  Negative for dizziness, syncope, speech difficulty, weakness, light-headedness, numbness and headaches.  Hematological:  Does not bruise/bleed easily.  Psychiatric/Behavioral:  Negative for agitation, behavioral problems, confusion, hallucinations, self-injury, sleep disturbance and suicidal ideas. The patient is not nervous/anxious.     Immunization History  Administered Date(s) Administered   Influenza Whole 08/10/2013   Influenza-Unspecified 05/05/2015   PFIZER(Purple Top)SARS-COV-2 Vaccination 09/12/2019, 10/07/2019, 07/18/2020   Pneumococcal Conjugate-13 04/21/2016   Pneumococcal Polysaccharide-23 07/20/2017, 02/05/2022   Tdap 01/01/2015   Pertinent  Health Maintenance Due  Topic Date Due   FOOT EXAM  02/23/2024   Influenza Vaccine  12/13/2024 (Originally 03/04/2024)   OPHTHALMOLOGY EXAM  08/17/2024   HEMOGLOBIN A1C  11/07/2024   DEXA SCAN  Completed   Mammogram  Discontinued   Colonoscopy  Discontinued      09/17/2022    2:34 PM 02/23/2023    8:28 AM 07/14/2023    2:24 PM 11/16/2023   11:32 AM 05/17/2024  9:23 AM  Fall Risk  Falls in the past year? 0 0 0 0 0  Was there an injury with Fall? 0 0 0 0 0  Fall Risk Category Calculator 0 0 0 0 0  Patient at Risk for Falls Due to No Fall Risks No Fall Risks  No Fall Risks No Fall Risks  Fall risk Follow up Falls evaluation completed Falls evaluation completed  Falls evaluation completed Falls evaluation completed   Functional Status Survey:    Vitals:   05/17/24 0927  BP: 124/72  Pulse: 69  Resp: 19  Temp: 97.6 F (36.4 C)  SpO2: 99%  Weight: 123 lb (55.8 kg)  Height: 5' 3 (1.6 m)   Body mass index is 21.79 kg/m. Physical Exam   VITALS: T- 97.6, P- 69, BP- 124/72, SaO2- 99% MEASUREMENTS: Weight- 123. GENERAL: Alert, cooperative, well developed, no acute distress. HEENT: Normocephalic, normal oropharynx, moist mucous  membranes, tympanic membranes normal, no sinus tenderness. NECK: Thyroid  normal. CHEST: Clear to auscultation bilaterally, no wheezes, rhonchi, or crackles, no chest wall tenderness. CARDIOVASCULAR: Normal heart rate and rhythm, S1 and S2 normal without murmurs. ABDOMEN: Soft, non-tender, non-distended, without organomegaly, normal bowel sounds. EXTREMITIES: No cyanosis or edema. NEUROLOGICAL: Cranial nerves grossly intact, moves all extremities without gross motor or sensory deficit, sensation intact in feet.  SKIN: No rash,no lesion or erythema   PSYCHIATRY/BEHAVIORAL: Mood stable   Labs reviewed: Recent Labs    06/10/23 0856 11/11/23 0840 05/09/24 0901  NA 140 141 137  K 4.4 4.1 3.8  CL 105 105 102  CO2 26 29 26   GLUCOSE 144* 165* 135*  BUN 25 20 17   CREATININE 0.71 0.70 0.69  CALCIUM  9.8 9.4 9.8   Recent Labs    06/10/23 0856 11/11/23 0840 05/09/24 0901  AST 14 16 15   ALT 17 21 18   BILITOT 0.4 0.3 0.5  PROT 7.2 7.1 7.4   Recent Labs    06/10/23 0856 11/11/23 0840 05/09/24 0901  WBC 9.2 9.1 8.4  NEUTROABS 4,600 4,350 3,637  HGB 12.4 12.0 12.1  HCT 38.3 36.3 37.6  MCV 89.9 90.3 91.7  PLT 395 371 334   Lab Results  Component Value Date   TSH 1.56 05/09/2024   Lab Results  Component Value Date   HGBA1C 8.2 (H) 05/09/2024   Lab Results  Component Value Date   CHOL 140 05/09/2024   HDL 64 05/09/2024   LDLCALC 52 05/09/2024   TRIG 160 (H) 05/09/2024   CHOLHDL 2.2 05/09/2024    Significant Diagnostic Results in last 30 days:  No results found.  Assessment/Plan  Type 2 diabetes mellitus Type 2 diabetes mellitus with improved glycemic control. Hemoglobin A1c decreased from 8.9% to 8.2%. Fasting glucose improved from 165 mg/dL to 864 mg/dL. - Continue current medications: Actos  15 mg daily, Tradjenta  5 mg daily - Encourage regular exercise, including indoor activities during winter - Encourage dietary modifications to maintain blood sugar  control  Mixed hyperlipidemia Mixed hyperlipidemia with well-controlled total cholesterol at 140 mg/dL, HDL improved to 64 mg/dL, elevated triglycerides at 160 mg/dL, and well-controlled LDL at 52 mg/dL. - Continue rosuvastatin  10 mg daily - Encourage dietary modifications to reduce triglycerides, such as reducing intake of bread, rice, pasta, and potatoes  Essential hypertension Blood pressure today is 124/72 mmHg. - Continue current medications: lisinopril  20 mg and hydrochlorothiazide  12.5 mg - Encourage home blood pressure monitoring  Right lower extremity pain with orthopedic implant Right lower extremity pain associated with orthopedic implant,  exacerbated by cold weather.  General Health Maintenance Discussed shingles vaccination, including benefits and minimal side effects. She declined flu and shingles vaccines at this time. Tetanus vaccine is up to date. Discussed importance of regular dental care and eye exams. - Encourage shingles vaccination at pharmacy - Encourage regular dental visits - Ensure diabetic eye exam is up to date, next due in 2026   Family/ staff Communication: Reviewed plan of care with patient verbalized understanding   Labs/tests ordered:  - CBC with Differential/Platelet - CMP with eGFR([facility]) - TSH - Hgb A1C - Lipid panel  Next Appointment : Return in about 6 months (around 11/15/2024) for medical mangement of chronic issues., Fasting labs in 6 months prior to visit.   Spent 30 minutes of Face to face and non-face to face with patient  >50% time spent counseling; reviewing medical record; tests; labs; documentation and developing future plan of care.   Roxan JAYSON Plough, NP

## 2024-05-31 DIAGNOSIS — Z0120 Encounter for dental examination and cleaning without abnormal findings: Secondary | ICD-10-CM | POA: Diagnosis not present

## 2024-06-08 DIAGNOSIS — K029 Dental caries, unspecified: Secondary | ICD-10-CM | POA: Diagnosis not present

## 2024-06-13 ENCOUNTER — Other Ambulatory Visit: Payer: Self-pay | Admitting: Family

## 2024-06-13 DIAGNOSIS — I10 Essential (primary) hypertension: Secondary | ICD-10-CM

## 2024-07-20 DIAGNOSIS — Z0120 Encounter for dental examination and cleaning without abnormal findings: Secondary | ICD-10-CM | POA: Diagnosis not present

## 2024-07-23 DIAGNOSIS — Z0100 Encounter for examination of eyes and vision without abnormal findings: Secondary | ICD-10-CM | POA: Diagnosis not present

## 2024-11-14 ENCOUNTER — Other Ambulatory Visit: Payer: Self-pay

## 2024-11-17 ENCOUNTER — Ambulatory Visit: Payer: Self-pay | Admitting: Family
